# Patient Record
Sex: Male | Born: 1940 | Race: White | Hispanic: No | Marital: Married | Smoking: Former smoker
Health system: Southern US, Community
[De-identification: ages and names within clinical notes are randomized; demographics above are authoritative.]

## PROBLEM LIST (undated history)

## (undated) DIAGNOSIS — K469 Unspecified abdominal hernia without obstruction or gangrene: Secondary | ICD-10-CM

## (undated) DIAGNOSIS — M542 Cervicalgia: Secondary | ICD-10-CM

## (undated) DIAGNOSIS — M1990 Unspecified osteoarthritis, unspecified site: Secondary | ICD-10-CM

## (undated) DIAGNOSIS — F32A Depression, unspecified: Secondary | ICD-10-CM

## (undated) DIAGNOSIS — I739 Peripheral vascular disease, unspecified: Secondary | ICD-10-CM

## (undated) DIAGNOSIS — R519 Headache, unspecified: Secondary | ICD-10-CM

## (undated) DIAGNOSIS — Z889 Allergy status to unspecified drugs, medicaments and biological substances status: Secondary | ICD-10-CM

## (undated) DIAGNOSIS — I240 Acute coronary thrombosis not resulting in myocardial infarction: Secondary | ICD-10-CM

## (undated) DIAGNOSIS — F419 Anxiety disorder, unspecified: Secondary | ICD-10-CM

## (undated) DIAGNOSIS — J45909 Unspecified asthma, uncomplicated: Secondary | ICD-10-CM

## (undated) DIAGNOSIS — F329 Major depressive disorder, single episode, unspecified: Secondary | ICD-10-CM

## (undated) DIAGNOSIS — E669 Obesity, unspecified: Secondary | ICD-10-CM

## (undated) DIAGNOSIS — Z8719 Personal history of other diseases of the digestive system: Secondary | ICD-10-CM

## (undated) DIAGNOSIS — K219 Gastro-esophageal reflux disease without esophagitis: Secondary | ICD-10-CM

## (undated) DIAGNOSIS — E785 Hyperlipidemia, unspecified: Secondary | ICD-10-CM

## (undated) DIAGNOSIS — K635 Polyp of colon: Secondary | ICD-10-CM

## (undated) DIAGNOSIS — K5792 Diverticulitis of intestine, part unspecified, without perforation or abscess without bleeding: Secondary | ICD-10-CM

## (undated) DIAGNOSIS — E119 Type 2 diabetes mellitus without complications: Secondary | ICD-10-CM

## (undated) DIAGNOSIS — C801 Malignant (primary) neoplasm, unspecified: Secondary | ICD-10-CM

## (undated) DIAGNOSIS — R51 Headache: Secondary | ICD-10-CM

## (undated) DIAGNOSIS — D649 Anemia, unspecified: Secondary | ICD-10-CM

## (undated) DIAGNOSIS — I639 Cerebral infarction, unspecified: Secondary | ICD-10-CM

## (undated) DIAGNOSIS — I10 Essential (primary) hypertension: Secondary | ICD-10-CM

## (undated) DIAGNOSIS — R0600 Dyspnea, unspecified: Secondary | ICD-10-CM

## (undated) DIAGNOSIS — H25019 Cortical age-related cataract, unspecified eye: Secondary | ICD-10-CM

## (undated) DIAGNOSIS — I2510 Atherosclerotic heart disease of native coronary artery without angina pectoris: Secondary | ICD-10-CM

## (undated) DIAGNOSIS — G4730 Sleep apnea, unspecified: Secondary | ICD-10-CM

## (undated) DIAGNOSIS — T884XXA Failed or difficult intubation, initial encounter: Secondary | ICD-10-CM

## (undated) HISTORY — PX: CARPAL TUNNEL RELEASE: SHX101

## (undated) HISTORY — PX: MOHS SURGERY: SUR867

## (undated) HISTORY — PX: TRIGGER FINGER RELEASE: SHX641

## (undated) HISTORY — PX: ROTATOR CUFF REPAIR: SHX139

## (undated) HISTORY — PX: BACK SURGERY: SHX140

## (undated) HISTORY — PX: TONSILLECTOMY: SUR1361

## (undated) HISTORY — PX: POLYPECTOMY: SHX149

## (undated) HISTORY — PX: APPENDECTOMY: SHX54

## (undated) HISTORY — PX: ESOPHAGOGASTRODUODENOSCOPY: SHX1529

## (undated) HISTORY — PX: COLONOSCOPY: SHX174

## (undated) HISTORY — PX: CARDIAC CATHETERIZATION: SHX172

---
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
## 2003-08-20 ENCOUNTER — Other Ambulatory Visit: Payer: Self-pay

## 2004-04-05 ENCOUNTER — Ambulatory Visit: Payer: Self-pay | Admitting: Internal Medicine

## 2006-02-20 ENCOUNTER — Ambulatory Visit: Payer: Self-pay | Admitting: Gastroenterology

## 2009-02-06 ENCOUNTER — Ambulatory Visit: Payer: Self-pay | Admitting: Neurology

## 2009-05-22 ENCOUNTER — Ambulatory Visit: Payer: Self-pay | Admitting: Internal Medicine

## 2010-03-08 ENCOUNTER — Ambulatory Visit: Payer: Self-pay | Admitting: Internal Medicine

## 2010-03-29 ENCOUNTER — Observation Stay: Payer: Self-pay | Admitting: Internal Medicine

## 2010-12-11 ENCOUNTER — Ambulatory Visit: Payer: Self-pay | Admitting: Unknown Physician Specialty

## 2011-06-19 ENCOUNTER — Ambulatory Visit: Payer: Self-pay | Admitting: Gastroenterology

## 2011-06-19 HISTORY — PX: ESOPHAGOGASTRODUODENOSCOPY: SHX1529

## 2012-01-08 ENCOUNTER — Ambulatory Visit: Payer: Self-pay | Admitting: Internal Medicine

## 2012-01-24 ENCOUNTER — Ambulatory Visit: Payer: Self-pay | Admitting: Unknown Physician Specialty

## 2013-11-03 DIAGNOSIS — J45909 Unspecified asthma, uncomplicated: Secondary | ICD-10-CM | POA: Insufficient documentation

## 2013-11-03 DIAGNOSIS — E669 Obesity, unspecified: Secondary | ICD-10-CM | POA: Insufficient documentation

## 2013-11-03 DIAGNOSIS — I10 Essential (primary) hypertension: Secondary | ICD-10-CM | POA: Insufficient documentation

## 2013-11-03 DIAGNOSIS — E785 Hyperlipidemia, unspecified: Secondary | ICD-10-CM | POA: Insufficient documentation

## 2013-11-03 DIAGNOSIS — E119 Type 2 diabetes mellitus without complications: Secondary | ICD-10-CM | POA: Insufficient documentation

## 2014-02-07 DIAGNOSIS — C44301 Unspecified malignant neoplasm of skin of nose: Secondary | ICD-10-CM | POA: Insufficient documentation

## 2014-03-23 DIAGNOSIS — M65332 Trigger finger, left middle finger: Secondary | ICD-10-CM

## 2014-03-23 DIAGNOSIS — M7541 Impingement syndrome of right shoulder: Secondary | ICD-10-CM | POA: Insufficient documentation

## 2014-03-23 DIAGNOSIS — M65341 Trigger finger, right ring finger: Secondary | ICD-10-CM

## 2014-03-23 HISTORY — DX: Trigger finger, right ring finger: M65.341

## 2014-03-23 HISTORY — DX: Trigger finger, left middle finger: M65.332

## 2014-04-18 ENCOUNTER — Ambulatory Visit: Payer: Self-pay | Admitting: Unknown Physician Specialty

## 2014-07-01 ENCOUNTER — Ambulatory Visit: Payer: Self-pay | Admitting: Unknown Physician Specialty

## 2014-07-07 DIAGNOSIS — M75101 Unspecified rotator cuff tear or rupture of right shoulder, not specified as traumatic: Secondary | ICD-10-CM | POA: Insufficient documentation

## 2014-12-09 DIAGNOSIS — M7551 Bursitis of right shoulder: Secondary | ICD-10-CM | POA: Insufficient documentation

## 2015-07-24 DIAGNOSIS — M25562 Pain in left knee: Secondary | ICD-10-CM | POA: Insufficient documentation

## 2015-07-26 ENCOUNTER — Ambulatory Visit
Admission: RE | Admit: 2015-07-26 | Discharge: 2015-07-26 | Disposition: A | Discharge status: Home / Self Care | Payer: Medicare HMO | Source: Ambulatory Visit | Attending: Unknown Physician Specialty | Admitting: Unknown Physician Specialty

## 2015-07-26 ENCOUNTER — Other Ambulatory Visit: Payer: Self-pay | Admitting: Unknown Physician Specialty

## 2015-07-26 DIAGNOSIS — X58XXXA Exposure to other specified factors, initial encounter: Secondary | ICD-10-CM | POA: Diagnosis not present

## 2015-07-26 DIAGNOSIS — S83209A Unspecified tear of unspecified meniscus, current injury, unspecified knee, initial encounter: Secondary | ICD-10-CM | POA: Insufficient documentation

## 2015-07-26 DIAGNOSIS — S83242A Other tear of medial meniscus, current injury, left knee, initial encounter: Secondary | ICD-10-CM | POA: Insufficient documentation

## 2015-07-26 DIAGNOSIS — M25562 Pain in left knee: Secondary | ICD-10-CM

## 2015-08-28 ENCOUNTER — Encounter
Admission: RE | Admit: 2015-08-28 | Discharge: 2015-08-28 | Disposition: A | Discharge status: Home / Self Care | Payer: Medicare HMO | Source: Ambulatory Visit | Attending: Unknown Physician Specialty | Admitting: Unknown Physician Specialty

## 2015-08-28 ENCOUNTER — Encounter: Payer: Self-pay | Admitting: *Deleted

## 2015-08-28 DIAGNOSIS — M94262 Chondromalacia, left knee: Secondary | ICD-10-CM | POA: Diagnosis not present

## 2015-08-28 DIAGNOSIS — I10 Essential (primary) hypertension: Secondary | ICD-10-CM | POA: Diagnosis not present

## 2015-08-28 DIAGNOSIS — Z79899 Other long term (current) drug therapy: Secondary | ICD-10-CM | POA: Diagnosis not present

## 2015-08-28 DIAGNOSIS — E669 Obesity, unspecified: Secondary | ICD-10-CM | POA: Diagnosis not present

## 2015-08-28 DIAGNOSIS — Z800 Family history of malignant neoplasm of digestive organs: Secondary | ICD-10-CM | POA: Diagnosis not present

## 2015-08-28 DIAGNOSIS — M25562 Pain in left knee: Secondary | ICD-10-CM | POA: Diagnosis present

## 2015-08-28 DIAGNOSIS — X58XXXA Exposure to other specified factors, initial encounter: Secondary | ICD-10-CM | POA: Diagnosis not present

## 2015-08-28 DIAGNOSIS — Z9049 Acquired absence of other specified parts of digestive tract: Secondary | ICD-10-CM | POA: Diagnosis not present

## 2015-08-28 DIAGNOSIS — Z9109 Other allergy status, other than to drugs and biological substances: Secondary | ICD-10-CM | POA: Diagnosis not present

## 2015-08-28 DIAGNOSIS — Z9889 Other specified postprocedural states: Secondary | ICD-10-CM | POA: Diagnosis not present

## 2015-08-28 DIAGNOSIS — G4730 Sleep apnea, unspecified: Secondary | ICD-10-CM | POA: Diagnosis not present

## 2015-08-28 DIAGNOSIS — E119 Type 2 diabetes mellitus without complications: Secondary | ICD-10-CM | POA: Diagnosis not present

## 2015-08-28 DIAGNOSIS — K219 Gastro-esophageal reflux disease without esophagitis: Secondary | ICD-10-CM | POA: Diagnosis not present

## 2015-08-28 DIAGNOSIS — Z7984 Long term (current) use of oral hypoglycemic drugs: Secondary | ICD-10-CM | POA: Diagnosis not present

## 2015-08-28 DIAGNOSIS — S83242A Other tear of medial meniscus, current injury, left knee, initial encounter: Secondary | ICD-10-CM | POA: Diagnosis not present

## 2015-08-28 DIAGNOSIS — Z888 Allergy status to other drugs, medicaments and biological substances status: Secondary | ICD-10-CM | POA: Diagnosis not present

## 2015-08-28 DIAGNOSIS — Z8249 Family history of ischemic heart disease and other diseases of the circulatory system: Secondary | ICD-10-CM | POA: Diagnosis not present

## 2015-08-28 DIAGNOSIS — J45909 Unspecified asthma, uncomplicated: Secondary | ICD-10-CM | POA: Diagnosis not present

## 2015-08-28 DIAGNOSIS — E785 Hyperlipidemia, unspecified: Secondary | ICD-10-CM | POA: Diagnosis not present

## 2015-08-28 DIAGNOSIS — Z880 Allergy status to penicillin: Secondary | ICD-10-CM | POA: Diagnosis not present

## 2015-08-28 DIAGNOSIS — Z6829 Body mass index (BMI) 29.0-29.9, adult: Secondary | ICD-10-CM | POA: Diagnosis not present

## 2015-08-28 DIAGNOSIS — Z791 Long term (current) use of non-steroidal anti-inflammatories (NSAID): Secondary | ICD-10-CM | POA: Diagnosis not present

## 2015-08-28 DIAGNOSIS — Z7951 Long term (current) use of inhaled steroids: Secondary | ICD-10-CM | POA: Diagnosis not present

## 2015-08-28 DIAGNOSIS — Z886 Allergy status to analgesic agent status: Secondary | ICD-10-CM | POA: Diagnosis not present

## 2015-08-28 DIAGNOSIS — M1990 Unspecified osteoarthritis, unspecified site: Secondary | ICD-10-CM | POA: Diagnosis not present

## 2015-08-28 LAB — BASIC METABOLIC PANEL
Anion gap: 12 (ref 5–15)
BUN: 25 mg/dL — AB (ref 6–20)
CHLORIDE: 100 mmol/L — AB (ref 101–111)
CO2: 27 mmol/L (ref 22–32)
CREATININE: 0.86 mg/dL (ref 0.61–1.24)
Calcium: 10 mg/dL (ref 8.9–10.3)
GFR calc Af Amer: >60 mL/min (ref >60)
GFR calc non Af Amer: >60 mL/min (ref >60)
Glucose, Bld: 65 mg/dL (ref 65–99)
Potassium: 4.2 mmol/L (ref 3.5–5.1)
Sodium: 139 mmol/L (ref 135–145)

## 2015-08-28 NOTE — Patient Instructions (Addendum)
  Your procedure is scheduled on: 08-30-15 [facility]) Report to [place] To find out your arrival time please call [phone] between 1PM - 3PM on 08-29-15 (TUESDAY)  Remember: Instructions that are not followed completely may result in serious medical risk, up to and including death, or upon the discretion of your surgeon and anesthesiologist your surgery may need to be rescheduled.    _X___ 1. Do not eat food or drink liquids after midnight. No gum chewing or hard candies.     _X___ 2. No Alcohol for 24 hours before or after surgery.   ____ 3. Bring all medications with you on the day of surgery if instructed.    _X___ 4. Notify your doctor if there is any change in your medical condition     (cold, fever, infections).     Do not wear jewelry, make-up, hairpins, clips or nail polish.  Do not wear lotions, powders, or perfumes. You may wear deodorant.  Do not shave 48 hours prior to surgery. Men may shave face and neck.  Do not bring valuables to the hospital.    [facility] is not responsible for any belongings or valuables.               Contacts, dentures or bridgework may not be worn into surgery.  Leave your suitcase in the car. After surgery it may be brought to your room.  For patients admitted to the hospital, discharge time is determined by your treatment team.   Patients discharged the day of surgery will not be allowed to drive home.   Please read over the following fact sheets that you were given:      _X___ Take these medicines the morning of surgery with A SIP OF WATER:    1. AMLODIPINE  2. PRILOSEC  3. TAKE A PRILOSEC Tuesday NIGHT BEFORE BED  4. MAY TAKE LORAZEPAM IF NEEDED AM OF SURGERY  5.  6.  ____ Fleet Enema (as directed)   _X___ Use CHG Soap as directed  ____ Use inhalers on the day of surgery  _X___ Stop metformin 2 days prior to surgery-STOP METFORMIN NOW   ____ Take 1/2 of usual insulin dose the night before  surgery and none on the morning of surgery.   ____ Stop Coumadin/Plavix/aspirin-N/A  _X___ Stop Anti-inflammatories-STOP MELOXICAM NOW-NO NSAIDS OR ASPIRIN PRODUCTS-TRAMADOL/TYLENOL OK TO TAKE   ____ Stop supplements until after surgery.    ____ Bring C-Pap to the hospital.

## 2015-08-30 ENCOUNTER — Encounter: Payer: Self-pay | Admitting: *Deleted

## 2015-08-30 ENCOUNTER — Encounter
Admission: RE | Disposition: A | Discharge status: Home / Self Care | Payer: Self-pay | Source: Ambulatory Visit | Attending: Unknown Physician Specialty

## 2015-08-30 ENCOUNTER — Ambulatory Visit
Admission: RE | Admit: 2015-08-30 | Discharge: 2015-08-30 | Disposition: A | Discharge status: Home / Self Care | Payer: Medicare HMO | Source: Ambulatory Visit | Attending: Unknown Physician Specialty | Admitting: Unknown Physician Specialty

## 2015-08-30 ENCOUNTER — Ambulatory Visit: Payer: Medicare HMO | Admitting: Anesthesiology

## 2015-08-30 DIAGNOSIS — Z800 Family history of malignant neoplasm of digestive organs: Secondary | ICD-10-CM | POA: Insufficient documentation

## 2015-08-30 DIAGNOSIS — Z791 Long term (current) use of non-steroidal anti-inflammatories (NSAID): Secondary | ICD-10-CM | POA: Insufficient documentation

## 2015-08-30 DIAGNOSIS — Z886 Allergy status to analgesic agent status: Secondary | ICD-10-CM | POA: Insufficient documentation

## 2015-08-30 DIAGNOSIS — Z9889 Other specified postprocedural states: Secondary | ICD-10-CM | POA: Insufficient documentation

## 2015-08-30 DIAGNOSIS — E669 Obesity, unspecified: Secondary | ICD-10-CM | POA: Insufficient documentation

## 2015-08-30 DIAGNOSIS — Z79899 Other long term (current) drug therapy: Secondary | ICD-10-CM | POA: Insufficient documentation

## 2015-08-30 DIAGNOSIS — J45909 Unspecified asthma, uncomplicated: Secondary | ICD-10-CM | POA: Insufficient documentation

## 2015-08-30 DIAGNOSIS — S83242A Other tear of medial meniscus, current injury, left knee, initial encounter: Secondary | ICD-10-CM | POA: Diagnosis not present

## 2015-08-30 DIAGNOSIS — M1990 Unspecified osteoarthritis, unspecified site: Secondary | ICD-10-CM | POA: Insufficient documentation

## 2015-08-30 DIAGNOSIS — Z7984 Long term (current) use of oral hypoglycemic drugs: Secondary | ICD-10-CM | POA: Insufficient documentation

## 2015-08-30 DIAGNOSIS — X58XXXA Exposure to other specified factors, initial encounter: Secondary | ICD-10-CM | POA: Insufficient documentation

## 2015-08-30 DIAGNOSIS — G4730 Sleep apnea, unspecified: Secondary | ICD-10-CM | POA: Insufficient documentation

## 2015-08-30 DIAGNOSIS — E119 Type 2 diabetes mellitus without complications: Secondary | ICD-10-CM | POA: Insufficient documentation

## 2015-08-30 DIAGNOSIS — M94262 Chondromalacia, left knee: Secondary | ICD-10-CM | POA: Insufficient documentation

## 2015-08-30 DIAGNOSIS — Z8249 Family history of ischemic heart disease and other diseases of the circulatory system: Secondary | ICD-10-CM | POA: Insufficient documentation

## 2015-08-30 DIAGNOSIS — E785 Hyperlipidemia, unspecified: Secondary | ICD-10-CM | POA: Insufficient documentation

## 2015-08-30 DIAGNOSIS — Z7951 Long term (current) use of inhaled steroids: Secondary | ICD-10-CM | POA: Insufficient documentation

## 2015-08-30 DIAGNOSIS — Z9109 Other allergy status, other than to drugs and biological substances: Secondary | ICD-10-CM | POA: Insufficient documentation

## 2015-08-30 DIAGNOSIS — Z880 Allergy status to penicillin: Secondary | ICD-10-CM | POA: Insufficient documentation

## 2015-08-30 DIAGNOSIS — I10 Essential (primary) hypertension: Secondary | ICD-10-CM | POA: Insufficient documentation

## 2015-08-30 DIAGNOSIS — Z888 Allergy status to other drugs, medicaments and biological substances status: Secondary | ICD-10-CM | POA: Insufficient documentation

## 2015-08-30 DIAGNOSIS — Z9049 Acquired absence of other specified parts of digestive tract: Secondary | ICD-10-CM | POA: Insufficient documentation

## 2015-08-30 DIAGNOSIS — K219 Gastro-esophageal reflux disease without esophagitis: Secondary | ICD-10-CM | POA: Insufficient documentation

## 2015-08-30 DIAGNOSIS — Z6829 Body mass index (BMI) 29.0-29.9, adult: Secondary | ICD-10-CM | POA: Insufficient documentation

## 2015-08-30 HISTORY — DX: Hyperlipidemia, unspecified: E78.5

## 2015-08-30 HISTORY — DX: Cervicalgia: M54.2

## 2015-08-30 HISTORY — DX: Unspecified asthma, uncomplicated: J45.909

## 2015-08-30 HISTORY — DX: Gastro-esophageal reflux disease without esophagitis: K21.9

## 2015-08-30 HISTORY — DX: Anxiety disorder, unspecified: F41.9

## 2015-08-30 HISTORY — PX: KNEE ARTHROSCOPY: SHX127

## 2015-08-30 HISTORY — DX: Essential (primary) hypertension: I10

## 2015-08-30 HISTORY — DX: Type 2 diabetes mellitus without complications: E11.9

## 2015-08-30 HISTORY — DX: Malignant (primary) neoplasm, unspecified: C80.1

## 2015-08-30 HISTORY — DX: Sleep apnea, unspecified: G47.30

## 2015-08-30 HISTORY — DX: Unspecified osteoarthritis, unspecified site: M19.90

## 2015-08-30 LAB — GLUCOSE, CAPILLARY
Glucose-Capillary: 187 mg/dL — ABNORMAL HIGH (ref 65–99)
Glucose-Capillary: 174 mg/dL — ABNORMAL HIGH (ref 65–99)

## 2015-08-30 SURGERY — ARTHROSCOPY, KNEE
Anesthesia: General | Site: Knee | Laterality: Left | Wound class: Clean

## 2015-08-30 MED ORDER — PROPOFOL 10 MG/ML IV BOLUS
INTRAVENOUS | Status: DC | PRN
Start: 2015-08-30 — End: 2015-08-30
  Administered 2015-08-30: 180 mg via INTRAVENOUS

## 2015-08-30 MED ORDER — FENTANYL CITRATE (PF) 100 MCG/2ML IJ SOLN: 25.0000 ug | INTRAMUSCULAR | Status: DC | PRN

## 2015-08-30 MED ORDER — DEXAMETHASONE SODIUM PHOSPHATE 10 MG/ML IJ SOLN
INTRAMUSCULAR | Status: DC | PRN
  Administered 2015-08-30: 5 mg via INTRAVENOUS

## 2015-08-30 MED ORDER — GLYCOPYRROLATE 0.2 MG/ML IJ SOLN
INTRAMUSCULAR | Status: DC | PRN
  Administered 2015-08-30: 0.2 mg via INTRAVENOUS

## 2015-08-30 MED ORDER — LIDOCAINE HCL (CARDIAC) 20 MG/ML IV SOLN
INTRAVENOUS | Status: DC | PRN
  Administered 2015-08-30: 100 mg via INTRAVENOUS

## 2015-08-30 MED ORDER — BUPIVACAINE HCL (PF) 0.5 % IJ SOLN
INTRAMUSCULAR | Status: DC | PRN
  Administered 2015-08-30: 15 mL

## 2015-08-30 MED ORDER — BUPIVACAINE HCL (PF) 0.5 % IJ SOLN
INTRAMUSCULAR | Status: AC
  Filled 2015-08-30: qty 30

## 2015-08-30 MED ORDER — ONDANSETRON HCL 4 MG/2ML IJ SOLN
INTRAMUSCULAR | Status: DC | PRN
  Administered 2015-08-30: 4 mg via INTRAVENOUS

## 2015-08-30 MED ORDER — ONDANSETRON HCL 4 MG/2ML IJ SOLN: 4.0000 mg | Freq: Once | INTRAMUSCULAR | Status: DC | PRN

## 2015-08-30 MED ORDER — SODIUM CHLORIDE 0.9 % IV SOLN
INTRAVENOUS | Status: DC
  Administered 2015-08-30: 07:00:00 via INTRAVENOUS

## 2015-08-30 MED ORDER — NORCO 5-325 MG PO TABS: 1.0000 | ORAL_TABLET | Freq: Four times a day (QID) | ORAL | Status: DC | PRN

## 2015-08-30 MED ORDER — FENTANYL CITRATE (PF) 100 MCG/2ML IJ SOLN
INTRAMUSCULAR | Status: DC | PRN
  Administered 2015-08-30 (×2): 25 ug via INTRAVENOUS

## 2015-08-30 SURGICAL SUPPLY — 38 items
ARTHROWAND PARAGON T2 (SURGICAL WAND)
BLADE ABRADER 4.5 (BLADE) ×3 IMPLANT
BLADE FULL RADIUS 3.5 (BLADE) ×3 IMPLANT
BLADE SHAVER 4.5X7 STR FR (MISCELLANEOUS) IMPLANT
BNDG ESMARK 6X12 TAN STRL LF (GAUZE/BANDAGES/DRESSINGS) ×3 IMPLANT
BUR ABRADER 4.0 W/FLUTE AQUA (MISCELLANEOUS) IMPLANT
BURR ABRADER 4.0 W/FLUTE AQUA (MISCELLANEOUS)
CHLORAPREP W/TINT 26ML (MISCELLANEOUS) ×3 IMPLANT
DRAPE LEGGINS SURG 28X43 STRL (DRAPES) ×3 IMPLANT
GAUZE SPONGE 4X4 12PLY STRL (GAUZE/BANDAGES/DRESSINGS) ×3 IMPLANT
GLOVE BIO SURGEON STRL SZ7.5 (GLOVE) ×3 IMPLANT
GLOVE BIO SURGEON STRL SZ8 (GLOVE) ×3 IMPLANT
GLOVE INDICATOR 8.0 STRL GRN (GLOVE) ×3 IMPLANT
GOWN STRL REUS W/ TWL LRG LVL3 (GOWN DISPOSABLE) ×1 IMPLANT
GOWN STRL REUS W/TWL LRG LVL3 (GOWN DISPOSABLE) ×2
GOWN STRL REUS W/TWL LRG LVL4 (GOWN DISPOSABLE) ×3 IMPLANT
IV LACTATED RINGER IRRG 3000ML (IV SOLUTION) ×4
IV LR IRRIG 3000ML ARTHROMATIC (IV SOLUTION) ×2 IMPLANT
KIT RM TURNOVER STRD PROC AR (KITS) ×3 IMPLANT
MANIFOLD 4PT FOR NEPTUNE1 (MISCELLANEOUS) ×3 IMPLANT
PACK ARTHROSCOPY KNEE (MISCELLANEOUS) ×3 IMPLANT
PADDING CAST 4IN STRL (MISCELLANEOUS)
PADDING CAST BLEND 4X4 STRL (MISCELLANEOUS) IMPLANT
SET TUBE SUCT SHAVER OUTFL 24K (TUBING) ×3 IMPLANT
SET TUBE TIP INTRA-ARTICULAR (MISCELLANEOUS) ×3 IMPLANT
SOL PREP PVP 2OZ (MISCELLANEOUS) ×3
SOLUTION PREP PVP 2OZ (MISCELLANEOUS) ×1 IMPLANT
SUT ETH BLK MONO 3 0 FS 1 12/B (SUTURE) ×3 IMPLANT
SUT ETHILON 3-0 FS-10 30 BLK (SUTURE) ×3
SUTURE EHLN 3-0 FS-10 30 BLK (SUTURE) ×1 IMPLANT
TAPE MICROFOAM 4IN (TAPE) ×3 IMPLANT
TUBING ARTHRO INFLOW-ONLY STRL (TUBING) ×3 IMPLANT
WAND 30 DEG SABER W/CORD (SURGICAL WAND) IMPLANT
WAND ARTHRO PARAGON T2 (SURGICAL WAND) IMPLANT
WAND COVAC 50 IFS (MISCELLANEOUS) ×3 IMPLANT
WAND HAND CNTRL MULTIVAC 50 (MISCELLANEOUS) IMPLANT
WAND HAND CNTRL MULTIVAC 90 (MISCELLANEOUS) IMPLANT
WRAP KNEE W/COLD PACKS 25.5X14 (SOFTGOODS) ×3 IMPLANT

## 2015-08-30 NOTE — Transfer of Care (Signed)
Immediate Anesthesia Transfer of Care Note  Patient: Connor Hall  Procedure(s) Performed: Procedure(s): ARTHROSCOPY KNEE LEFT, partial medial menisectomy, chondral debridement (Left)  Patient Location: PACU  Anesthesia Type:General  Level of Consciousness: awake, alert , oriented and patient cooperative  Airway & Oxygen Therapy: Patient Spontanous Breathing and Patient connected to nasal cannula oxygen  Post-op Assessment: Report given to RN, Post -op Vital signs reviewed and stable and Patient moving all extremities  Post vital signs: Reviewed and stable  Last Vitals:  Filed Vitals:   08/30/15 0605 08/30/15 0901  BP: 163/80 150/89  Pulse: 71 68  Temp: 36.9 C 36.2 C  Resp: 16 14    Last Pain:  Filed Vitals:   08/30/15 0903  PainSc: 2          Complications: No apparent anesthesia complications

## 2015-08-30 NOTE — Op Note (Signed)
Patient: Connor Hall, Connor Hall.  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   History: Patient's had a long history of left knee pain.  The plain films revealed mild medial compartment narrowing .  The patient had an MRI which revealed torn medial meniscus plus medial compartment chondral pathology.The patient was scheduled for surgery due to persistent discomfort despite conservative treatment.  The patient was taken the operating room where satisfactory general anesthesia was achieved. A tourniquet and leg holder were was applied to the left thigh. A well leg support was applied to the nonoperative extremity. The left knee was prepped and draped in usual fashion for an arthroscopic procedure. An inflow cannula was introduced superomedially. The joint was distended with lactated Ringer's. Scope was introduced through an inferolateral puncture wound and a probe through an inferomedial puncture wound. Inspection of the medial compartment revealed  a posterior root tear of the medial meniscus plus grade 3 medial compartment chondral changes. I resected the medial meniscus tear using a combination of basket biters and a motorized resector. There remaining rim was contoured with an angled ArthroCare wand. I used a turbo whisker to debride the chondral changes. Inspection of the intercondylar notch revealed intact cruciates. Inspection of the the lateral compartment revealed no significant meniscal tearing or chondral pathology.   Trochlear groove was inspected and appeared to be fairly smooth.  Retropatellar surface was smooth. The patella seemed to track fairly well.  The instruments were removed from the joint at  this time. The puncture wounds were closed with 3-0 nylon in vertical mattress fashion. I injected each puncture wound with several cc of half percent Marcaine without epinephrine. Betadine was applied the wounds followed by sterile dressing. An ice pack was applied to the right knee. The patient was awakened and transferred to the stretcher bed. The patient was taken to the recovery room in satisfactory condition.  The tourniquet was not inflated during the course of the procedure. Blood loss was negligible.

## 2015-08-30 NOTE — Discharge Instructions (Signed)
[facility]  Kathrene Alu., M.D. [phone]   KNEE ARTHROSCOPY POST OPERATION INSTRUCTIONS:  PLEASE READ THESE INSTRUCTIONS ABOUT POST OPERATION CARE. THEY WILL ANSWER MOST OF YOUR QUESTIONS.  You have been given a prescription for pain. Please take as directed for pain.  You can walk, keeping the knee slightly stiff-avoid doing too much bending the first day. (if ACL reconstruction is performed, keep brace locked in extension when walking.)  You will use crutches or cane if needed. Can weight bear as tolerated  Plan to take three to four days off from work. You can resume work when you are comfortable. (This can be a week or more, depending on the type of work you do.)  To reduce pain and swelling, place one to two pillows under the knee the first two or three days when sitting or lying. An ice pack may be placed on top of the area over the dressing. Instructions for making homemade icepack are as follow:  Flexible homemade alcohol water ice pack  2 cups water  1 cup rubbing alcohol  food coloring for the blue tint (optional)  2 zip-top bags - gallon-size  Mix the water and alcohol together in one of your zip-top bags and add food coloring. Release as much air as possible and seal the bag. Place in freezer for at least 12 hours.  The small incisions in your knee are closed with nylon stitches. They will be removed in the office.  The bulky dressing may be removed in the third day after surgery. (If ACL surgery-DO NOT REMOVE BANDAGES). Put a waterproof band-aid over each stitch. Do not put any creams or ointments on wounds. You may shower at this time, but change waterproof band-aids after showering. KEEP INCISIONS CLEAN AND DRY UNTIL YOU RETURN TO THE OFFICE.  Sometimes the operative area remains somewhat painful and swollen for several weeks. This is usually nothing to worry about, but call if you have any excessive symptoms, especially  fever. It is not unusual to have a low grade fever of 99 degrees for the first few days. If persist after 3-4 days call the office. It is not uncommon for the pain to be a little worse on the third day after surgery.  Begin doing gentle exercises right away. They will be limited by the amount of pain and swelling you have.  Exercising will reduce the swelling, increase motion, and prevent muscle weakness. Exercises: Straight leg raising and gentle knee bending.  Take 81 milligram aspirin twice a day for 2 weeks after meals or milk. This along with elevation will help reduce the possibility of phlebitis in your operated leg.  Avoid strenuous athletics for a minimum of 4 to 6 weeks after arthroscopic surgery (approximately five months if ACL surgery).  If the surgery included ACL reconstruction the brace that is supplied to the extremity post surgery is to be locked in extension when you are asleep and is to be locked in extension when you are ambulating. It can be unlocked for exercises or sitting.  Keep your post surgery appointment that has been made for you. If you do not remember the date call [phone]. Your follow up appointment should be between 7-10 days.  AMBULATORY SURGERY  DISCHARGE INSTRUCTIONS   The drugs that you were given will stay in your system until tomorrow so for the next 24 hours you should not:  Drive an automobile Make any legal decisions Drink any alcoholic beverage  You may resume regular meals tomorrow.  Today it is better to start with liquids and gradually work up to solid foods.  You may eat anything you prefer, but it is better to start with liquids, then soup and crackers, and gradually work up to solid foods.   Please notify your doctor immediately if you have any unusual bleeding, trouble breathing, redness and pain at the surgery site, drainage, fever, or pain not relieved by medication.    Additional Instructions:        Please contact your  physician with any problems or Same Day Surgery at [phone], Monday through Friday 6 am to 4 pm, or [place] at [facility] number at [phone].

## 2015-08-30 NOTE — H&P (Signed)
  H and P reviewed. No changes. Uploaded at later date. 

## 2015-08-30 NOTE — Anesthesia Preprocedure Evaluation (Signed)
Anesthesia Evaluation  Patient identified by MRN, date of birth, ID band Patient awake    Reviewed: Allergy & Precautions, H&P , NPO status , Patient's Chart, lab work & pertinent test results, reviewed documented beta blocker date and time   History of Anesthesia Complications Negative for: history of anesthetic complications  Airway Mallampati: I  TM Distance: >3 FB Neck ROM: full    Dental no notable dental hx. (+) Teeth Intact, Caps   Pulmonary neg shortness of breath, asthma , neg sleep apnea, neg COPD, neg recent URI, former smoker,    Pulmonary exam normal breath sounds clear to auscultation       Cardiovascular Exercise Tolerance: Good hypertension, (-) angina(-) CAD, (-) Past MI, (-) Cardiac Stents and (-) CABG Normal cardiovascular exam(-) dysrhythmias (-) Valvular Problems/Murmurs Rhythm:regular Rate:Normal     Neuro/Psych negative neurological ROS  negative psych ROS   GI/Hepatic Neg liver ROS, GERD  Controlled and Medicated,  Endo/Other  diabetes, Oral Hypoglycemic Agents  Renal/GU negative Renal ROS  negative genitourinary   Musculoskeletal   Abdominal   Peds  Hematology negative hematology ROS (+)   Anesthesia Other Findings Past Medical History:   Arthritis                                                    Cervicalgia                                                  Diabetes mellitus without complication (HCC)                 Hyperlipidemia                                               Hypertension                                                 Sleep apnea                                                  Asthma                                                         Comment:WELL CONTROLLED   Anxiety                                                      GERD (gastroesophageal reflux disease)  Cancer (HCC)                                                   Comment:MELANOMA  (NOSE, FACE AND HANDS), BASAL CELL   Reproductive/Obstetrics negative OB ROS                             Anesthesia Physical Anesthesia Plan  ASA: III  Anesthesia Plan: General   Post-op Pain Management:    Induction:   Airway Management Planned:   Additional Equipment:   Intra-op Plan:   Post-operative Plan:   Informed Consent: I have reviewed the patients History and Physical, chart, labs and discussed the procedure including the risks, benefits and alternatives for the proposed anesthesia with the patient or authorized representative who has indicated his/her understanding and acceptance.   Dental Advisory Given  Plan Discussed with: Anesthesiologist, CRNA and Surgeon  Anesthesia Plan Comments:         Anesthesia Quick Evaluation

## 2015-08-30 NOTE — Anesthesia Postprocedure Evaluation (Signed)
Anesthesia Post Note  Patient: SAJAN MALOOF  Procedure(s) Performed: Procedure(s) (LRB): ARTHROSCOPY KNEE LEFT, partial medial menisectomy, chondral debridement (Left)  Patient location during evaluation: PACU Anesthesia Type: General Level of consciousness: awake and alert Pain management: pain level controlled Vital Signs Assessment: post-procedure vital signs reviewed and stable Respiratory status: spontaneous breathing, nonlabored ventilation, respiratory function stable and patient connected to nasal cannula oxygen Cardiovascular status: blood pressure returned to baseline and stable Postop Assessment: no signs of nausea or vomiting Anesthetic complications: no    Last Vitals:  Filed Vitals:   08/30/15 0954 08/30/15 1000  BP: 156/68 148/83  Pulse: 73 59  Temp: 35.9 C   Resp: 16     Last Pain:  Filed Vitals:   08/30/15 1028  PainSc: 2                  Martha Clan

## 2015-08-30 NOTE — Anesthesia Procedure Notes (Signed)
Procedure Name: LMA Insertion Date/Time: 08/30/2015 7:39 AM Performed by: Alda Berthold Pre-anesthesia Checklist: Patient identified, Emergency Drugs available, Suction available and Patient being monitored Patient Re-evaluated:Patient Re-evaluated prior to inductionOxygen Delivery Method: Circle system utilized Preoxygenation: Pre-oxygenation with 100% oxygen Intubation Type: IV induction Ventilation: Mask ventilation without difficulty LMA: LMA inserted LMA Size: 5.0 Tube type: Oral Number of attempts: 1 Placement Confirmation: positive ETCO2 and breath sounds checked- equal and bilateral Tube secured with: Tape Dental Injury: Teeth and Oropharynx as per pre-operative assessment

## 2015-10-09 DIAGNOSIS — M1712 Unilateral primary osteoarthritis, left knee: Secondary | ICD-10-CM | POA: Insufficient documentation

## 2016-03-25 ENCOUNTER — Encounter: Payer: Self-pay | Admitting: *Deleted

## 2016-04-02 NOTE — H&P (Signed)
See scanned note.

## 2016-04-03 ENCOUNTER — Ambulatory Visit
Admission: RE | Admit: 2016-04-03 | Discharge: 2016-04-03 | Disposition: A | Discharge status: Home / Self Care | Payer: Medicare HMO | Source: Ambulatory Visit | Attending: Ophthalmology | Admitting: Ophthalmology

## 2016-04-03 ENCOUNTER — Encounter: Payer: Self-pay | Admitting: *Deleted

## 2016-04-03 ENCOUNTER — Ambulatory Visit: Payer: Medicare HMO | Admitting: Anesthesiology

## 2016-04-03 ENCOUNTER — Encounter
Admission: RE | Disposition: A | Discharge status: Home / Self Care | Payer: Self-pay | Source: Ambulatory Visit | Attending: Ophthalmology

## 2016-04-03 DIAGNOSIS — I10 Essential (primary) hypertension: Secondary | ICD-10-CM | POA: Diagnosis not present

## 2016-04-03 DIAGNOSIS — K449 Diaphragmatic hernia without obstruction or gangrene: Secondary | ICD-10-CM | POA: Insufficient documentation

## 2016-04-03 DIAGNOSIS — E1136 Type 2 diabetes mellitus with diabetic cataract: Secondary | ICD-10-CM | POA: Insufficient documentation

## 2016-04-03 DIAGNOSIS — G4730 Sleep apnea, unspecified: Secondary | ICD-10-CM | POA: Diagnosis not present

## 2016-04-03 DIAGNOSIS — Z7984 Long term (current) use of oral hypoglycemic drugs: Secondary | ICD-10-CM | POA: Insufficient documentation

## 2016-04-03 DIAGNOSIS — E785 Hyperlipidemia, unspecified: Secondary | ICD-10-CM | POA: Diagnosis not present

## 2016-04-03 DIAGNOSIS — Z79899 Other long term (current) drug therapy: Secondary | ICD-10-CM | POA: Diagnosis not present

## 2016-04-03 DIAGNOSIS — Z87891 Personal history of nicotine dependence: Secondary | ICD-10-CM | POA: Insufficient documentation

## 2016-04-03 DIAGNOSIS — F419 Anxiety disorder, unspecified: Secondary | ICD-10-CM | POA: Insufficient documentation

## 2016-04-03 DIAGNOSIS — K219 Gastro-esophageal reflux disease without esophagitis: Secondary | ICD-10-CM | POA: Insufficient documentation

## 2016-04-03 DIAGNOSIS — M1990 Unspecified osteoarthritis, unspecified site: Secondary | ICD-10-CM | POA: Insufficient documentation

## 2016-04-03 DIAGNOSIS — F329 Major depressive disorder, single episode, unspecified: Secondary | ICD-10-CM | POA: Diagnosis not present

## 2016-04-03 HISTORY — PX: CATARACT EXTRACTION W/PHACO: SHX586

## 2016-04-03 HISTORY — DX: Personal history of other diseases of the digestive system: Z87.19

## 2016-04-03 HISTORY — DX: Depression, unspecified: F32.A

## 2016-04-03 HISTORY — DX: Major depressive disorder, single episode, unspecified: F32.9

## 2016-04-03 LAB — GLUCOSE, CAPILLARY: Glucose-Capillary: 158 mg/dL — ABNORMAL HIGH (ref 65–99)

## 2016-04-03 SURGERY — PHACOEMULSIFICATION, CATARACT, WITH IOL INSERTION
Anesthesia: Monitor Anesthesia Care | Site: Eye | Laterality: Right | Wound class: Clean

## 2016-04-03 MED ORDER — CYCLOPENTOLATE HCL 2 % OP SOLN
1.0000 [drp] | OPHTHALMIC | Status: DC
  Administered 2016-04-03 (×4): 1 [drp] via OPHTHALMIC

## 2016-04-03 MED ORDER — MIDAZOLAM HCL 2 MG/2ML IJ SOLN
INTRAMUSCULAR | Status: DC | PRN
  Administered 2016-04-03: 1 mg via INTRAVENOUS

## 2016-04-03 MED ORDER — EPINEPHRINE PF 1 MG/ML IJ SOLN
INTRAMUSCULAR | Status: AC
  Filled 2016-04-03: qty 2

## 2016-04-03 MED ORDER — LIDOCAINE HCL (PF) 4 % IJ SOLN
INTRAMUSCULAR | Status: DC | PRN
  Administered 2016-04-03: 4 mL via OPHTHALMIC

## 2016-04-03 MED ORDER — TETRACAINE HCL 0.5 % OP SOLN
OPHTHALMIC | Status: DC | PRN
  Administered 2016-04-03: 1 [drp] via OPHTHALMIC

## 2016-04-03 MED ORDER — CYCLOPENTOLATE HCL 2 % OP SOLN
OPHTHALMIC | Status: AC
  Filled 2016-04-03: qty 2

## 2016-04-03 MED ORDER — ALFENTANIL 500 MCG/ML IJ INJ
INJECTION | INTRAMUSCULAR | Status: DC | PRN
  Administered 2016-04-03: 750 ug via INTRAVENOUS

## 2016-04-03 MED ORDER — LIDOCAINE HCL (PF) 4 % IJ SOLN
INTRAMUSCULAR | Status: AC
  Filled 2016-04-03: qty 5

## 2016-04-03 MED ORDER — EPINEPHRINE PF 1 MG/ML IJ SOLN
INTRAOCULAR | Status: DC | PRN
  Administered 2016-04-03: 09:00:00 via OPHTHALMIC

## 2016-04-03 MED ORDER — MOXIFLOXACIN HCL 0.5 % OP SOLN
1.0000 [drp] | OPHTHALMIC | Status: AC
  Administered 2016-04-03 (×3): 1 [drp] via OPHTHALMIC

## 2016-04-03 MED ORDER — PHENYLEPHRINE HCL 10 % OP SOLN
OPHTHALMIC | Status: AC
  Filled 2016-04-03: qty 5

## 2016-04-03 MED ORDER — PHENYLEPHRINE HCL 10 % OP SOLN
1.0000 [drp] | OPHTHALMIC | Status: AC
  Administered 2016-04-03 (×4): 1 [drp] via OPHTHALMIC

## 2016-04-03 MED ORDER — NA CHONDROIT SULF-NA HYALURON 40-17 MG/ML IO SOLN
INTRAOCULAR | Status: AC
  Filled 2016-04-03: qty 1

## 2016-04-03 MED ORDER — POVIDONE-IODINE 5 % OP SOLN
OPHTHALMIC | Status: DC | PRN
  Administered 2016-04-03: 1 via OPHTHALMIC

## 2016-04-03 MED ORDER — MOXIFLOXACIN HCL 0.5 % OP SOLN
OPHTHALMIC | Status: AC
  Filled 2016-04-03: qty 3

## 2016-04-03 MED ORDER — SODIUM CHLORIDE 0.9 % IV SOLN
INTRAVENOUS | Status: DC
  Administered 2016-04-03: 09:00:00 via INTRAVENOUS

## 2016-04-03 MED ORDER — CEFUROXIME OPHTHALMIC INJECTION 1 MG/0.1 ML
INJECTION | OPHTHALMIC | Status: AC
  Filled 2016-04-03: qty 0.1

## 2016-04-03 MED ORDER — TETRACAINE HCL 0.5 % OP SOLN
OPHTHALMIC | Status: AC
  Filled 2016-04-03: qty 2

## 2016-04-03 MED ORDER — POVIDONE-IODINE 5 % OP SOLN
OPHTHALMIC | Status: AC
  Filled 2016-04-03: qty 30

## 2016-04-03 MED ORDER — CARBACHOL 0.01 % IO SOLN
INTRAOCULAR | Status: DC | PRN
  Administered 2016-04-03: 0.5 mL via INTRAOCULAR

## 2016-04-03 MED ORDER — HYALURONIDASE HUMAN 150 UNIT/ML IJ SOLN
INTRAMUSCULAR | Status: AC
  Filled 2016-04-03: qty 1

## 2016-04-03 MED ORDER — LIDOCAINE HCL (PF) 4 % IJ SOLN
INTRAMUSCULAR | Status: DC | PRN
  Administered 2016-04-03: 9 mL via OPHTHALMIC

## 2016-04-03 MED ORDER — NA CHONDROIT SULF-NA HYALURON 40-17 MG/ML IO SOLN
INTRAOCULAR | Status: DC | PRN
  Administered 2016-04-03: 1 mL via INTRAOCULAR

## 2016-04-03 MED ORDER — BUPIVACAINE HCL (PF) 0.75 % IJ SOLN
INTRAMUSCULAR | Status: AC
Start: 2016-04-03 — End: 2016-04-03
  Filled 2016-04-03: qty 10

## 2016-04-03 SURGICAL SUPPLY — 30 items
CANNULA ANT/CHMB 27GA (MISCELLANEOUS) ×3 IMPLANT
CORD BIP STRL DISP 12FT (MISCELLANEOUS) ×3 IMPLANT
CUP MEDICINE 2OZ PLAST GRAD ST (MISCELLANEOUS) ×3 IMPLANT
DRAPE XRAY CASSETTE 23X24 (DRAPES) ×3 IMPLANT
ERASER HMR WETFIELD 18G (MISCELLANEOUS) ×3 IMPLANT
GLOVE BIO SURGEON STRL SZ8 (GLOVE) ×3 IMPLANT
GLOVE SURG LX 6.5 MICRO (GLOVE) ×2
GLOVE SURG LX 8.0 MICRO (GLOVE) ×2
GLOVE SURG LX STRL 6.5 MICRO (GLOVE) ×1 IMPLANT
GLOVE SURG LX STRL 8.0 MICRO (GLOVE) ×1 IMPLANT
GOWN STRL REUS W/ TWL LRG LVL3 (GOWN DISPOSABLE) ×1 IMPLANT
GOWN STRL REUS W/ TWL XL LVL3 (GOWN DISPOSABLE) ×1 IMPLANT
GOWN STRL REUS W/TWL LRG LVL3 (GOWN DISPOSABLE) ×2
GOWN STRL REUS W/TWL XL LVL3 (GOWN DISPOSABLE) ×2
LENS IOL ACRSF IQ ULTRA 18.5 (Intraocular Lens) ×1 IMPLANT
LENS IOL ACRYSOF IQ 18.5 (Intraocular Lens) ×3 IMPLANT
PACK CATARACT (MISCELLANEOUS) ×3 IMPLANT
PACK CATARACT DINGLEDEIN LX (MISCELLANEOUS) ×3 IMPLANT
PACK EYE AFTER SURG (MISCELLANEOUS) ×3 IMPLANT
SHLD EYE VISITEC  UNIV (MISCELLANEOUS) ×3 IMPLANT
SOL BSS BAG (MISCELLANEOUS) ×3
SOL PREP PVP 2OZ (MISCELLANEOUS) ×3
SOLUTION BSS BAG (MISCELLANEOUS) ×1 IMPLANT
SOLUTION PREP PVP 2OZ (MISCELLANEOUS) ×1 IMPLANT
SUT SILK 5-0 (SUTURE) ×3 IMPLANT
SYR 3ML LL SCALE MARK (SYRINGE) ×3 IMPLANT
SYR 5ML LL (SYRINGE) ×3 IMPLANT
SYR TB 1ML 27GX1/2 LL (SYRINGE) ×3 IMPLANT
WATER STERILE IRR 250ML POUR (IV SOLUTION) ×3 IMPLANT
WIPE NON LINTING 3.25X3.25 (MISCELLANEOUS) ×3 IMPLANT

## 2016-04-03 NOTE — Transfer of Care (Signed)
Immediate Anesthesia Transfer of Care Note  Patient: Connor Hall  Procedure(s) Performed: Procedure(s) with comments: CATARACT EXTRACTION PHACO AND INTRAOCULAR LENS PLACEMENT (IOC) (Right) - [place] 1.37 AP% 25.8 CDE 44.67 Fluid pack lot # [id number]  Patient Location: PACU  Anesthesia Type:MAC  Level of Consciousness: awake, alert  and oriented  Airway & Oxygen Therapy: Patient Spontanous Breathing  Post-op Assessment: Report given to RN and Post -op Vital signs reviewed and stable  Post vital signs: Reviewed and stable  Last Vitals:  Vitals:   04/03/16 0757  BP: (!) 147/77  Pulse: (!) 59  Resp: 16  Temp: 36.8 C    Last Pain:  Vitals:   04/03/16 0757  TempSrc: Oral         Complications: No apparent anesthesia complications

## 2016-04-03 NOTE — Anesthesia Postprocedure Evaluation (Signed)
Anesthesia Post Note  Patient: Connor Hall  Procedure(s) Performed: Procedure(s) (LRB): CATARACT EXTRACTION PHACO AND INTRAOCULAR LENS PLACEMENT (IOC) (Right)  Patient location during evaluation: PACU Anesthesia Type: MAC Level of consciousness: awake, awake and alert and oriented Pain management: pain level controlled Vital Signs Assessment: post-procedure vital signs reviewed and stable Respiratory status: spontaneous breathing Cardiovascular status: blood pressure returned to baseline and stable Postop Assessment: no headache and no signs of nausea or vomiting Anesthetic complications: no    Last Vitals:  Vitals:   04/03/16 0757  BP: (!) 147/77  Pulse: (!) 59  Resp: 16  Temp: 36.8 C    Last Pain:  Vitals:   04/03/16 0757  TempSrc: Oral                 Assunta Pupo,  [facility]

## 2016-04-03 NOTE — Anesthesia Preprocedure Evaluation (Signed)
Anesthesia Evaluation  Patient identified by MRN, date of birth, ID band Patient awake    Reviewed: Allergy & Precautions, NPO status , Patient's Chart, lab work & pertinent test results  History of Anesthesia Complications Negative for: history of anesthetic complications  Airway Mallampati: II  TM Distance: >3 FB Neck ROM: Full    Dental no notable dental hx.    Pulmonary asthma (primarily around smoke, has not had problems in years) , sleep apnea , former smoker,    breath sounds clear to auscultation- rhonchi (-) wheezing      Cardiovascular Exercise Tolerance: Good hypertension, Pt. on medications (-) CAD and (-) Past MI  Rhythm:Regular Rate:Normal - Systolic murmurs and - Diastolic murmurs    Neuro/Psych Anxiety Depression negative neurological ROS     GI/Hepatic Neg liver ROS, hiatal hernia, GERD  ,  Endo/Other  diabetes, Type 2, Oral Hypoglycemic Agents  Renal/GU negative Renal ROS     Musculoskeletal  (+) Arthritis ,   Abdominal (+) - obese,   Peds  Hematology negative hematology ROS (+)   Anesthesia Other Findings Past Medical History: No date: Anxiety No date: Arthritis No date: Asthma     Comment: WELL CONTROLLED No date: Cancer ([place])     Comment: MELANOMA (NOSE, FACE AND HANDS), BASAL CELL No date: Cervicalgia No date: Depression No date: Diabetes mellitus without complication (HCC) No date: GERD (gastroesophageal reflux disease) No date: History of hiatal hernia No date: Hyperlipidemia No date: Hypertension No date: Sleep apnea     Comment: DOES NOT USE CPAP   Reproductive/Obstetrics                             Anesthesia Physical Anesthesia Plan  ASA: II  Anesthesia Plan: MAC   Post-op Pain Management:    Induction: Intravenous  Airway Management Planned: Natural Airway  Additional Equipment:   Intra-op Plan:   Post-operative Plan:   Informed  Consent: I have reviewed the patients History and Physical, chart, labs and discussed the procedure including the risks, benefits and alternatives for the proposed anesthesia with the patient or authorized representative who has indicated his/her understanding and acceptance.   Dental advisory given  Plan Discussed with: CRNA and Anesthesiologist  Anesthesia Plan Comments:         Anesthesia Quick Evaluation

## 2016-04-03 NOTE — Interval H&P Note (Signed)
History and Physical Interval Note:  04/03/2016 9:02 AM  [name]  has presented today for surgery, with the diagnosis of Nuclear Sclerotic Cataract Right Eye  The various methods of treatment have been discussed with the patient and family. After consideration of risks, benefits and other options for treatment, the patient has consented to  Procedure(s) with comments: CATARACT EXTRACTION PHACO AND INTRAOCULAR LENS PLACEMENT (IOC) (Right) - [place] AP% CDE Fluid pack lot #[id number] as a surgical intervention .  The patient's history has been reviewed, patient examined, no change in status, stable for surgery.  I have reviewed the patient's chart and labs.  Questions were answered to the patient's satisfaction.     Connor Hall

## 2016-04-03 NOTE — Discharge Instructions (Signed)
Eye Surgery Discharge Instructions  Expect mild scratchy sensation or mild soreness. DO NOT RUB YOUR EYE!  The day of surgery:  Minimal physical activity, but bed rest is not required  No reading, computer work, or close hand work  No bending, lifting, or straining.  May watch TV  For 24 hours:  No driving, legal decisions, or alcoholic beverages  Safety precautions  Eat anything you prefer: It is better to start with liquids, then soup then solid foods.  _____ Eye patch should be worn until postoperative exam tomorrow.  ____ Solar shield eyeglasses should be worn for comfort in the sunlight/patch while sleeping  Resume all regular medications including aspirin or Coumadin if these were discontinued prior to surgery. You may shower, bathe, shave, or wash your hair. Tylenol may be taken for mild discomfort.  Call your doctor if you experience significant pain, nausea, or vomiting, fever > 101 or other signs of infection. [phone] or [phone] Specific instructions:  Follow-up Information    Jahi Roza, MD Follow up.   Specialty:  Ophthalmology Why:  November 30 at 11:00am Contact information: [address]   [place] [address] [phone]

## 2016-04-03 NOTE — Op Note (Signed)
Date of Surgery: 04/03/2016 Date of Dictation: 04/03/2016 9:42 AM Pre-operative Diagnosis:  Nuclear Sclerotic Cataract right Eye Post-operative Diagnosis: same Procedure performed: Extra-capsular Cataract Extraction (ECCE) with placement of a posterior chamber intraocular lens (IOL) right Eye IOL:  Implant Name Type Inv. Item Serial No. Manufacturer Lot No. LRB No. Used  LENS IOL ACRYSOF IQ 18.5 - [id number] Intraocular Lens LENS IOL ACRYSOF IQ 18.5 [id number] ALCON   Right 1   Anesthesia: 2% Lidocaine and 4% Marcaine in a 50/50 mixture with 10 unites/ml of Hylenex given as a peribulbar Anesthesiologist: Anesthesiologist: Emmie Niemann, MD CRNA: Rolla Plate, CRNA; Nelda Marseille, CRNA Complications: none Estimated Blood Loss: less than 1 ml  Description of procedure:  The patient was given anesthesia and sedation via intravenous access. The patient was then prepped and draped in the usual fashion. A 25-gauge needle was bent for initiating the capsulorhexis. A 5-0 silk suture was placed through the conjunctiva superior and inferiorly to serve as bridle sutures. Hemostasis was obtained at the superior limbus using an eraser cautery. A partial thickness groove was made at the anterior surgical limbus with a 64 Beaver blade and this was dissected anteriorly with an [employer]aya. The anterior chamber was entered at 10 o'clock with a 1.0 mm paracentesis knife and through the lamellar dissection with a 2.6 mm Alcon keratome. Epi-Shugarcaine 0.5 CC [9 cc BSS Plus (Alcon), 3 cc 4% preservative-free lidocaine (Hospira) and 4 cc 1:1000 preservative-free, bisulfite-free epinephrine] was injected into the anterior chamber via the paracentesis tract. Epi-Shugarcaine 0.5 CC [9 cc BSS Plus (Alcon), 3 cc 4% preservative-free lidocaine (Hospira) and 4 cc 1:1000 preservative-free, bisulfite-free epinephrine] was injected into the anterior chamber via the paracentesis tract. DiscoVisc was injected to  replace the aqueous and a continuous tear curvilinear capsulorhexis was performed using a bent 25-gauge needle.  Balance salt on a syringe was used to perform hydro-dissection and phacoemulsification was carried out using a divide and conquer technique. Procedure(s) with comments: CATARACT EXTRACTION PHACO AND INTRAOCULAR LENS PLACEMENT (IOC) (Right) - [place] 1.37 AP% 25.8 CDE 44.67 Fluid pack lot # [id number]. Irrigation/aspiration was used to remove the residual cortex and the capsular bag was inflated with DiscoVisc. The intraocular lens was inserted into the capsular bag using a pre-loaded UltraSert Delivery System. Irrigation/aspiration was used to remove the residual DiscoVisc. The wound was inflated with balanced salt and checked for leaks. None were found. Miostat was injected via the paracentesis track and 0.1 ml of Vigamox  was injected via the paracentesis track. The wound was checked for leaks again and none were found.   The bridal sutures were removed and two drops of Vigamox were placed on the eye. An eye shield was placed to protect the eye and the patient was discharged to the recovery area in good condition.   Aleida Crandell MD

## 2016-05-21 ENCOUNTER — Encounter: Payer: Self-pay | Admitting: *Deleted

## 2016-05-29 ENCOUNTER — Ambulatory Visit
Admission: RE | Admit: 2016-05-29 | Discharge: 2016-05-29 | Disposition: A | Discharge status: Home / Self Care | Payer: Medicare HMO | Source: Ambulatory Visit | Attending: Ophthalmology | Admitting: Ophthalmology

## 2016-05-29 ENCOUNTER — Ambulatory Visit: Payer: Medicare HMO | Admitting: Anesthesiology

## 2016-05-29 ENCOUNTER — Encounter: Payer: Self-pay | Admitting: *Deleted

## 2016-05-29 ENCOUNTER — Encounter
Admission: RE | Disposition: A | Discharge status: Home / Self Care | Payer: Self-pay | Source: Ambulatory Visit | Attending: Ophthalmology

## 2016-05-29 DIAGNOSIS — Z85828 Personal history of other malignant neoplasm of skin: Secondary | ICD-10-CM | POA: Diagnosis not present

## 2016-05-29 DIAGNOSIS — K449 Diaphragmatic hernia without obstruction or gangrene: Secondary | ICD-10-CM | POA: Diagnosis not present

## 2016-05-29 DIAGNOSIS — G4730 Sleep apnea, unspecified: Secondary | ICD-10-CM | POA: Insufficient documentation

## 2016-05-29 DIAGNOSIS — Z87891 Personal history of nicotine dependence: Secondary | ICD-10-CM | POA: Diagnosis not present

## 2016-05-29 DIAGNOSIS — F329 Major depressive disorder, single episode, unspecified: Secondary | ICD-10-CM | POA: Diagnosis not present

## 2016-05-29 DIAGNOSIS — E1136 Type 2 diabetes mellitus with diabetic cataract: Secondary | ICD-10-CM | POA: Diagnosis not present

## 2016-05-29 DIAGNOSIS — F419 Anxiety disorder, unspecified: Secondary | ICD-10-CM | POA: Diagnosis not present

## 2016-05-29 DIAGNOSIS — Z79899 Other long term (current) drug therapy: Secondary | ICD-10-CM | POA: Insufficient documentation

## 2016-05-29 DIAGNOSIS — I10 Essential (primary) hypertension: Secondary | ICD-10-CM | POA: Insufficient documentation

## 2016-05-29 DIAGNOSIS — E785 Hyperlipidemia, unspecified: Secondary | ICD-10-CM | POA: Diagnosis not present

## 2016-05-29 DIAGNOSIS — M1990 Unspecified osteoarthritis, unspecified site: Secondary | ICD-10-CM | POA: Diagnosis not present

## 2016-05-29 DIAGNOSIS — Z7984 Long term (current) use of oral hypoglycemic drugs: Secondary | ICD-10-CM | POA: Diagnosis not present

## 2016-05-29 DIAGNOSIS — K219 Gastro-esophageal reflux disease without esophagitis: Secondary | ICD-10-CM | POA: Diagnosis not present

## 2016-05-29 HISTORY — PX: CATARACT EXTRACTION W/PHACO: SHX586

## 2016-05-29 LAB — GLUCOSE, CAPILLARY: Glucose-Capillary: 180 mg/dL — ABNORMAL HIGH (ref 65–99)

## 2016-05-29 SURGERY — PHACOEMULSIFICATION, CATARACT, WITH IOL INSERTION
Anesthesia: Monitor Anesthesia Care | Site: Eye | Laterality: Left | Wound class: Clean

## 2016-05-29 MED ORDER — MOXIFLOXACIN HCL 0.5 % OP SOLN
1.0000 [drp] | OPHTHALMIC | Status: AC
  Administered 2016-05-29 (×3): 1 [drp] via OPHTHALMIC

## 2016-05-29 MED ORDER — MOXIFLOXACIN HCL 0.5 % OP SOLN
OPHTHALMIC | Status: AC
  Filled 2016-05-29: qty 3

## 2016-05-29 MED ORDER — EPINEPHRINE PF 1 MG/ML IJ SOLN
INTRAMUSCULAR | Status: DC | PRN
  Administered 2016-05-29: 1 mL via OPHTHALMIC

## 2016-05-29 MED ORDER — EPINEPHRINE PF 1 MG/ML IJ SOLN
INTRAMUSCULAR | Status: AC
  Filled 2016-05-29: qty 1

## 2016-05-29 MED ORDER — MOXIFLOXACIN HCL 0.5 % OP SOLN
OPHTHALMIC | Status: DC | PRN
  Administered 2016-05-29: 0.2 mL via OPHTHALMIC

## 2016-05-29 MED ORDER — NA CHONDROIT SULF-NA HYALURON 40-17 MG/ML IO SOLN
INTRAOCULAR | Status: AC
  Filled 2016-05-29: qty 1

## 2016-05-29 MED ORDER — CARBACHOL 0.01 % IO SOLN
INTRAOCULAR | Status: DC | PRN
  Administered 2016-05-29: .5 mL via INTRAOCULAR

## 2016-05-29 MED ORDER — TETRACAINE HCL 0.5 % OP SOLN
OPHTHALMIC | Status: DC | PRN
  Administered 2016-05-29: 1 [drp] via OPHTHALMIC

## 2016-05-29 MED ORDER — BUPIVACAINE HCL (PF) 0.75 % IJ SOLN
INTRAMUSCULAR | Status: AC
Start: 2016-05-29 — End: 2016-05-29
  Filled 2016-05-29: qty 10

## 2016-05-29 MED ORDER — CEFUROXIME OPHTHALMIC INJECTION 1 MG/0.1 ML
INJECTION | OPHTHALMIC | Status: AC
  Filled 2016-05-29: qty 0.1

## 2016-05-29 MED ORDER — CYCLOPENTOLATE HCL 2 % OP SOLN
OPHTHALMIC | Status: AC
  Administered 2016-05-29: 1 [drp] via OPHTHALMIC
  Filled 2016-05-29: qty 2

## 2016-05-29 MED ORDER — POVIDONE-IODINE 5 % OP SOLN
OPHTHALMIC | Status: AC
Start: 2016-05-29 — End: 2016-05-29
  Filled 2016-05-29: qty 30

## 2016-05-29 MED ORDER — ALFENTANIL 500 MCG/ML IJ INJ
INJECTION | INTRAMUSCULAR | Status: DC | PRN
  Administered 2016-05-29: 500 ug via INTRAVENOUS

## 2016-05-29 MED ORDER — MIDAZOLAM HCL 2 MG/2ML IJ SOLN
INTRAMUSCULAR | Status: AC
  Filled 2016-05-29: qty 2

## 2016-05-29 MED ORDER — SODIUM CHLORIDE 0.9 % IV SOLN
INTRAVENOUS | Status: DC
  Administered 2016-05-29: 07:00:00 via INTRAVENOUS

## 2016-05-29 MED ORDER — MOXIFLOXACIN HCL 0.5 % OP SOLN
OPHTHALMIC | Status: AC
  Administered 2016-05-29: 1 [drp] via OPHTHALMIC
  Filled 2016-05-29: qty 3

## 2016-05-29 MED ORDER — MIDAZOLAM HCL 5 MG/5ML IJ SOLN
INTRAMUSCULAR | Status: DC | PRN
Start: 2016-05-29 — End: 2016-05-29
  Administered 2016-05-29: 1 mg via INTRAVENOUS

## 2016-05-29 MED ORDER — PHENYLEPHRINE HCL 10 % OP SOLN
1.0000 [drp] | OPHTHALMIC | Status: AC
  Administered 2016-05-29 (×4): 1 [drp] via OPHTHALMIC

## 2016-05-29 MED ORDER — POVIDONE-IODINE 5 % OP SOLN
OPHTHALMIC | Status: DC | PRN
  Administered 2016-05-29: 1 via OPHTHALMIC

## 2016-05-29 MED ORDER — TETRACAINE HCL 0.5 % OP SOLN
OPHTHALMIC | Status: AC
  Filled 2016-05-29: qty 2

## 2016-05-29 MED ORDER — LIDOCAINE HCL (PF) 4 % IJ SOLN
INTRAMUSCULAR | Status: DC | PRN
  Administered 2016-05-29: 4 mL via OPHTHALMIC

## 2016-05-29 MED ORDER — NA CHONDROIT SULF-NA HYALURON 40-17 MG/ML IO SOLN
INTRAOCULAR | Status: DC | PRN
  Administered 2016-05-29: 1 mL via INTRAOCULAR

## 2016-05-29 MED ORDER — LIDOCAINE HCL (PF) 4 % IJ SOLN
INTRAOCULAR | Status: DC | PRN
  Administered 2016-05-29: 2.25 mL via OPHTHALMIC

## 2016-05-29 MED ORDER — HYALURONIDASE HUMAN 150 UNIT/ML IJ SOLN
INTRAMUSCULAR | Status: AC
Start: 2016-05-29 — End: 2016-05-29
  Filled 2016-05-29: qty 1

## 2016-05-29 MED ORDER — LIDOCAINE HCL (PF) 4 % IJ SOLN
INTRAMUSCULAR | Status: AC
Start: 2016-05-29 — End: 2016-05-29
  Filled 2016-05-29: qty 5

## 2016-05-29 MED ORDER — CYCLOPENTOLATE HCL 2 % OP SOLN
1.0000 [drp] | OPHTHALMIC | Status: AC
  Administered 2016-05-29 (×4): 1 [drp] via OPHTHALMIC

## 2016-05-29 MED ORDER — PHENYLEPHRINE HCL 10 % OP SOLN
OPHTHALMIC | Status: AC
  Administered 2016-05-29: 1 [drp] via OPHTHALMIC
  Filled 2016-05-29: qty 5

## 2016-05-29 SURGICAL SUPPLY — 30 items
CANNULA ANT/CHMB 27GA (MISCELLANEOUS) ×3 IMPLANT
CORD BIP STRL DISP 12FT (MISCELLANEOUS) ×3 IMPLANT
CUP MEDICINE 2OZ PLAST GRAD ST (MISCELLANEOUS) ×3 IMPLANT
DRAPE XRAY CASSETTE 23X24 (DRAPES) ×3 IMPLANT
ERASER HMR WETFIELD 18G (MISCELLANEOUS) ×3 IMPLANT
GLOVE BIO SURGEON STRL SZ8 (GLOVE) ×3 IMPLANT
GLOVE SURG LX 6.5 MICRO (GLOVE) ×2
GLOVE SURG LX 8.0 MICRO (GLOVE) ×2
GLOVE SURG LX STRL 6.5 MICRO (GLOVE) ×1 IMPLANT
GLOVE SURG LX STRL 8.0 MICRO (GLOVE) ×1 IMPLANT
GOWN STRL REUS W/ TWL LRG LVL3 (GOWN DISPOSABLE) ×1 IMPLANT
GOWN STRL REUS W/ TWL XL LVL3 (GOWN DISPOSABLE) ×1 IMPLANT
GOWN STRL REUS W/TWL LRG LVL3 (GOWN DISPOSABLE) ×2
GOWN STRL REUS W/TWL XL LVL3 (GOWN DISPOSABLE) ×2
LENS IOL ACRSF IQ ULTRA 19.0 (Intraocular Lens) ×1 IMPLANT
LENS IOL ACRYSOF IQ 19.0 (Intraocular Lens) ×3 IMPLANT
PACK CATARACT (MISCELLANEOUS) ×3 IMPLANT
PACK CATARACT DINGLEDEIN LX (MISCELLANEOUS) ×3 IMPLANT
PACK EYE AFTER SURG (MISCELLANEOUS) ×3 IMPLANT
SHLD EYE VISITEC  UNIV (MISCELLANEOUS) ×3 IMPLANT
SOL BSS BAG (MISCELLANEOUS) ×3
SOL PREP PVP 2OZ (MISCELLANEOUS) ×3
SOLUTION BSS BAG (MISCELLANEOUS) ×1 IMPLANT
SOLUTION PREP PVP 2OZ (MISCELLANEOUS) ×1 IMPLANT
SUT SILK 5-0 (SUTURE) ×3 IMPLANT
SYR 3ML LL SCALE MARK (SYRINGE) ×3 IMPLANT
SYR 5ML LL (SYRINGE) ×3 IMPLANT
SYR TB 1ML 27GX1/2 LL (SYRINGE) ×3 IMPLANT
WATER STERILE IRR 250ML POUR (IV SOLUTION) ×3 IMPLANT
WIPE NON LINTING 3.25X3.25 (MISCELLANEOUS) ×3 IMPLANT

## 2016-05-29 NOTE — Transfer of Care (Signed)
Immediate Anesthesia Transfer of Care Note  Patient: Connor Hall  Procedure(s) Performed: Procedure(s) with comments: CATARACT EXTRACTION PHACO AND INTRAOCULAR LENS PLACEMENT (IOC) (Left) - [place] 01:16 AP% 23.3 CDE 31.18 Fluid pack lot # [id number]  Patient Location: PACU  Anesthesia Type:MAC  Level of Consciousness: awake, alert , oriented and patient cooperative  Airway & Oxygen Therapy: Patient Spontanous Breathing  Post-op Assessment: Report given to RN, Post -op Vital signs reviewed and stable and Patient moving all extremities X 4  Post vital signs: Reviewed and stable  Last Vitals:  Vitals:   05/29/16 0820 05/29/16 0822  BP: (!) 143/69 (!) 143/69  Pulse: (!) 53 (!) 53  Resp: 18 14  Temp: 36.5 C 36.5 C    Last Pain:  Vitals:   05/29/16 0820  TempSrc: Temporal         Complications: No apparent anesthesia complications

## 2016-05-29 NOTE — Op Note (Signed)
Date of Surgery: 05/29/2016 Date of Dictation: 05/29/2016 8:18 AM Pre-operative Diagnosis:  Nuclear Sclerotic Cataract left Eye Post-operative Diagnosis: same Procedure performed: Extra-capsular Cataract Extraction (ECCE) with placement of a posterior chamber intraocular lens (IOL) left Eye IOL:  Implant Name Type Inv. Item Serial No. Manufacturer Lot No. LRB No. Used  LENS IOL ACRYSOF IQ 19.0 - [id number] Intraocular Lens LENS IOL ACRYSOF IQ 19.0 [id number] ALCON   Left 1   Anesthesia: 2% Lidocaine and 4% Marcaine in a 50/50 mixture with 10 unites/ml of Hylenex given as a peribulbar Anesthesiologist: Anesthesiologist: Martha Clan, MD CRNA: Silvana Newness, CRNA Complications: none Estimated Blood Loss: less than 1 ml  Description of procedure:  The patient was given anesthesia and sedation via intravenous access. The patient was then prepped and draped in the usual fashion. A 25-gauge needle was bent for initiating the capsulorhexis. A 5-0 silk suture was placed through the conjunctiva superior and inferiorly to serve as bridle sutures. Hemostasis was obtained at the superior limbus using an eraser cautery. A partial thickness groove was made at the anterior surgical limbus with a 64 Beaver blade and this was dissected anteriorly with an Avaya. The anterior chamber was entered at 10 o'clock with a 1.0 mm paracentesis knife and through the lamellar dissection with a 2.6 mm Alcon keratome. Epi-Shugarcaine 0.5 CC [9 cc BSS Plus (Alcon), 3 cc 4% preservative-free lidocaine (Hospira) and 4 cc 1:1000 preservative-free, bisulfite-free epinephrine] was injected into the anterior chamber via the paracentesis tract. Epi-Shugarcaine 0.5 CC [9 cc BSS Plus (Alcon), 3 cc 4% preservative-free lidocaine (Hospira) and 4 cc 1:1000 preservative-free, bisulfite-free epinephrine] was injected into the anterior chamber via the paracentesis tract. DiscoVisc was injected to replace the aqueous and a  continuous tear curvilinear capsulorhexis was performed using a bent 25-gauge needle.  Balance salt on a syringe was used to perform hydro-dissection and phacoemulsification was carried out using a divide and conquer technique. Procedure(s) with comments: CATARACT EXTRACTION PHACO AND INTRAOCULAR LENS PLACEMENT (IOC) (Left) - [place] 01:16 AP% 23.3 CDE 31.18 Fluid pack lot # [id number]. Irrigation/aspiration was used to remove the residual cortex and the capsular bag was inflated with DiscoVisc. The intraocular lens was inserted into the capsular bag using a pre-loaded UltraSert Delivery System. Irrigation/aspiration was used to remove the residual DiscoVisc. The wound was inflated with balanced salt and checked for leaks. None were found. Miostat was injected via the paracentesis track and 0.1 ml of Vigamox containing 1 mg of drug  was injected via the paracentesis track. The wound was checked for leaks again and none were found.   The bridal sutures were removed and two drops of Vigamox were placed on the eye. An eye shield was placed to protect the eye and the patient was discharged to the recovery area in good condition.   Rosaisela Jamroz MD

## 2016-05-29 NOTE — Discharge Instructions (Signed)
Eye Surgery Discharge Instructions  Expect mild scratchy sensation or mild soreness. DO NOT RUB YOUR EYE!  The day of surgery:  Minimal physical activity, but bed rest is not required  No reading, computer work, or close hand work  No bending, lifting, or straining.  May watch TV  For 24 hours:  No driving, legal decisions, or alcoholic beverages  Safety precautions  Eat anything you prefer: It is better to start with liquids, then soup then solid foods.  _____ Eye patch should be worn until postoperative exam tomorrow.  ____ Solar shield eyeglasses should be worn for comfort in the sunlight/patch while sleeping  Resume all regular medications including aspirin or Coumadin if these were discontinued prior to surgery. You may shower, bathe, shave, or wash your hair. Tylenol may be taken for mild discomfort.  Call your doctor if you experience significant pain, nausea, or vomiting, fever > 101 or other signs of infection. [phone] or [phone] Specific instructions:  Follow-up Information    Chelsei Mcchesney, MD Follow up on 05/30/2016.   Specialty:  Ophthalmology Why:  10:45 am Contact information: [address]   [place] [address] [phone]

## 2016-05-29 NOTE — Anesthesia Preprocedure Evaluation (Signed)
Anesthesia Evaluation  Patient identified by MRN, date of birth, ID band Patient awake    Reviewed: Allergy & Precautions, NPO status , Patient's Chart, lab work & pertinent test results  History of Anesthesia Complications Negative for: history of anesthetic complications  Airway Mallampati: II  TM Distance: >3 FB Neck ROM: Full    Dental no notable dental hx.    Pulmonary asthma (primarily around smoke, has not had problems in years) , sleep apnea , former smoker,    breath sounds clear to auscultation- rhonchi (-) wheezing      Cardiovascular Exercise Tolerance: Good hypertension, Pt. on medications (-) CAD and (-) Past MI  Rhythm:Regular Rate:Normal - Systolic murmurs and - Diastolic murmurs    Neuro/Psych PSYCHIATRIC DISORDERS Anxiety Depression negative neurological ROS     GI/Hepatic Neg liver ROS, hiatal hernia, GERD  ,  Endo/Other  diabetes, Type 2, Oral Hypoglycemic Agents  Renal/GU negative Renal ROS     Musculoskeletal  (+) Arthritis ,   Abdominal (+) - obese,   Peds  Hematology negative hematology ROS (+)   Anesthesia Other Findings Past Medical History: No date: Anxiety No date: Arthritis No date: Asthma     Comment: WELL CONTROLLED No date: Cancer ([place])     Comment: MELANOMA (NOSE, FACE AND HANDS), BASAL CELL No date: Cervicalgia No date: Depression No date: Diabetes mellitus without complication (HCC) No date: GERD (gastroesophageal reflux disease) No date: History of hiatal hernia No date: Hyperlipidemia No date: Hypertension No date: Sleep apnea     Comment: DOES NOT USE CPAP   Reproductive/Obstetrics                             Anesthesia Physical  Anesthesia Plan  ASA: II  Anesthesia Plan: MAC   Post-op Pain Management:    Induction: Intravenous  Airway Management Planned: Natural Airway  Additional Equipment:   Intra-op Plan:   Post-operative  Plan:   Informed Consent: I have reviewed the patients History and Physical, chart, labs and discussed the procedure including the risks, benefits and alternatives for the proposed anesthesia with the patient or authorized representative who has indicated his/her understanding and acceptance.   Dental advisory given  Plan Discussed with: CRNA and Anesthesiologist  Anesthesia Plan Comments:         Anesthesia Quick Evaluation

## 2016-05-29 NOTE — Anesthesia Post-op Follow-up Note (Cosign Needed)
Anesthesia QCDR form completed.        

## 2016-05-29 NOTE — Interval H&P Note (Signed)
History and Physical Interval Note:  05/29/2016 7:26 AM  Connor Hall  has presented today for surgery, with the diagnosis of nuclear slerotic cataract left eye  The various methods of treatment have been discussed with the patient and family. After consideration of risks, benefits and other options for treatment, the patient has consented to  Procedure(s) with comments: CATARACT EXTRACTION PHACO AND INTRAOCULAR LENS PLACEMENT (IOC) (Left) - [place] AP% CDE Fluid pack lot # [id number] as a surgical intervention .  The patient's history has been reviewed, patient examined, no change in status, stable for surgery.  I have reviewed the patient's chart and labs.  Questions were answered to the patient's satisfaction.     Stanford Strauch

## 2016-05-29 NOTE — Anesthesia Postprocedure Evaluation (Signed)
Anesthesia Post Note  Patient: Connor Hall  Procedure(s) Performed: Procedure(s) (LRB): CATARACT EXTRACTION PHACO AND INTRAOCULAR LENS PLACEMENT (IOC) (Left)  Patient location during evaluation: PACU Anesthesia Type: MAC Level of consciousness: awake and alert Pain management: pain level controlled Vital Signs Assessment: post-procedure vital signs reviewed and stable Respiratory status: spontaneous breathing, nonlabored ventilation and respiratory function stable Cardiovascular status: stable and blood pressure returned to baseline Anesthetic complications: no     Last Vitals:  Vitals:   05/29/16 0820 05/29/16 0822  BP: (!) 143/69 (!) 143/69  Pulse: (!) 53 (!) 53  Resp: 18 14  Temp: 36.5 C 36.5 C    Last Pain:  Vitals:   05/29/16 0820  TempSrc: Temporal                 Silvana Newness A

## 2016-05-29 NOTE — H&P (Signed)
See scanned note.

## 2016-10-11 DIAGNOSIS — D649 Anemia, unspecified: Secondary | ICD-10-CM | POA: Insufficient documentation

## 2016-10-11 DIAGNOSIS — D509 Iron deficiency anemia, unspecified: Secondary | ICD-10-CM | POA: Insufficient documentation

## 2016-10-11 DIAGNOSIS — Z8371 Family history of colonic polyps: Secondary | ICD-10-CM | POA: Insufficient documentation

## 2017-01-14 ENCOUNTER — Encounter: Payer: Self-pay | Admitting: *Deleted

## 2017-01-15 ENCOUNTER — Ambulatory Visit: Payer: Medicare HMO | Admitting: Anesthesiology

## 2017-01-15 ENCOUNTER — Encounter
Admission: RE | Disposition: A | Discharge status: Home / Self Care | Payer: Self-pay | Source: Ambulatory Visit | Attending: Unknown Physician Specialty

## 2017-01-15 ENCOUNTER — Encounter: Payer: Self-pay | Admitting: *Deleted

## 2017-01-15 ENCOUNTER — Ambulatory Visit
Admission: RE | Admit: 2017-01-15 | Discharge: 2017-01-15 | Disposition: A | Discharge status: Home / Self Care | Payer: Medicare HMO | Source: Ambulatory Visit | Attending: Unknown Physician Specialty | Admitting: Unknown Physician Specialty

## 2017-01-15 DIAGNOSIS — K640 First degree hemorrhoids: Secondary | ICD-10-CM | POA: Insufficient documentation

## 2017-01-15 DIAGNOSIS — F329 Major depressive disorder, single episode, unspecified: Secondary | ICD-10-CM | POA: Insufficient documentation

## 2017-01-15 DIAGNOSIS — I10 Essential (primary) hypertension: Secondary | ICD-10-CM | POA: Diagnosis not present

## 2017-01-15 DIAGNOSIS — D509 Iron deficiency anemia, unspecified: Secondary | ICD-10-CM | POA: Diagnosis not present

## 2017-01-15 DIAGNOSIS — Z7984 Long term (current) use of oral hypoglycemic drugs: Secondary | ICD-10-CM | POA: Insufficient documentation

## 2017-01-15 DIAGNOSIS — K5730 Diverticulosis of large intestine without perforation or abscess without bleeding: Secondary | ICD-10-CM | POA: Diagnosis not present

## 2017-01-15 DIAGNOSIS — Z8371 Family history of colonic polyps: Secondary | ICD-10-CM | POA: Insufficient documentation

## 2017-01-15 DIAGNOSIS — K2980 Duodenitis without bleeding: Secondary | ICD-10-CM | POA: Diagnosis not present

## 2017-01-15 DIAGNOSIS — E119 Type 2 diabetes mellitus without complications: Secondary | ICD-10-CM | POA: Insufficient documentation

## 2017-01-15 DIAGNOSIS — E785 Hyperlipidemia, unspecified: Secondary | ICD-10-CM | POA: Diagnosis not present

## 2017-01-15 DIAGNOSIS — R51 Headache: Secondary | ICD-10-CM | POA: Insufficient documentation

## 2017-01-15 DIAGNOSIS — Z1211 Encounter for screening for malignant neoplasm of colon: Secondary | ICD-10-CM | POA: Insufficient documentation

## 2017-01-15 DIAGNOSIS — F419 Anxiety disorder, unspecified: Secondary | ICD-10-CM | POA: Diagnosis not present

## 2017-01-15 DIAGNOSIS — G4730 Sleep apnea, unspecified: Secondary | ICD-10-CM | POA: Insufficient documentation

## 2017-01-15 DIAGNOSIS — M1990 Unspecified osteoarthritis, unspecified site: Secondary | ICD-10-CM | POA: Insufficient documentation

## 2017-01-15 DIAGNOSIS — D649 Anemia, unspecified: Secondary | ICD-10-CM | POA: Diagnosis not present

## 2017-01-15 DIAGNOSIS — Z85820 Personal history of malignant melanoma of skin: Secondary | ICD-10-CM | POA: Insufficient documentation

## 2017-01-15 DIAGNOSIS — K2970 Gastritis, unspecified, without bleeding: Secondary | ICD-10-CM | POA: Diagnosis not present

## 2017-01-15 DIAGNOSIS — Z87891 Personal history of nicotine dependence: Secondary | ICD-10-CM | POA: Insufficient documentation

## 2017-01-15 DIAGNOSIS — K219 Gastro-esophageal reflux disease without esophagitis: Secondary | ICD-10-CM | POA: Insufficient documentation

## 2017-01-15 DIAGNOSIS — J45909 Unspecified asthma, uncomplicated: Secondary | ICD-10-CM | POA: Insufficient documentation

## 2017-01-15 HISTORY — DX: Unspecified abdominal hernia without obstruction or gangrene: K46.9

## 2017-01-15 HISTORY — DX: Anemia, unspecified: D64.9

## 2017-01-15 HISTORY — DX: Headache: R51

## 2017-01-15 HISTORY — DX: Diverticulitis of intestine, part unspecified, without perforation or abscess without bleeding: K57.92

## 2017-01-15 HISTORY — PX: COLONOSCOPY WITH PROPOFOL: SHX5780

## 2017-01-15 HISTORY — DX: Headache, unspecified: R51.9

## 2017-01-15 HISTORY — PX: ESOPHAGOGASTRODUODENOSCOPY (EGD) WITH PROPOFOL: SHX5813

## 2017-01-15 LAB — GLUCOSE, CAPILLARY: Glucose-Capillary: 155 mg/dL — ABNORMAL HIGH (ref 65–99)

## 2017-01-15 SURGERY — ESOPHAGOGASTRODUODENOSCOPY (EGD) WITH PROPOFOL
Anesthesia: General

## 2017-01-15 MED ORDER — EPHEDRINE SULFATE 50 MG/ML IJ SOLN
INTRAMUSCULAR | Status: DC | PRN
  Administered 2017-01-15 (×2): 5 mg via INTRAVENOUS

## 2017-01-15 MED ORDER — PROPOFOL 10 MG/ML IV BOLUS
INTRAVENOUS | Status: AC
  Filled 2017-01-15: qty 60

## 2017-01-15 MED ORDER — PROPOFOL 10 MG/ML IV BOLUS
INTRAVENOUS | Status: DC | PRN
  Administered 2017-01-15: 60 mg via INTRAVENOUS

## 2017-01-15 MED ORDER — PROPOFOL 10 MG/ML IV BOLUS
INTRAVENOUS | Status: AC
  Filled 2017-01-15: qty 40

## 2017-01-15 MED ORDER — SODIUM CHLORIDE 0.9 % IV SOLN
INTRAVENOUS | Status: DC
  Administered 2017-01-15: 08:00:00 via INTRAVENOUS

## 2017-01-15 MED ORDER — SODIUM CHLORIDE 0.9 % IV SOLN
INTRAVENOUS | Status: DC
Start: 2017-01-15 — End: 2017-01-15
  Administered 2017-01-15: 1000 mL via INTRAVENOUS

## 2017-01-15 MED ORDER — PROPOFOL 500 MG/50ML IV EMUL
INTRAVENOUS | Status: DC | PRN
  Administered 2017-01-15: 100 ug/kg/min via INTRAVENOUS

## 2017-01-15 NOTE — Op Note (Signed)
[facility] Gastroenterology Patient Name: Connor Hall Procedure Date: 01/15/2017 7:33 AM MRN: [id number] Account #: [id number] Date of Birth: 07/23/1940 Admit Type: Outpatient Age: 76 Room: [facility] ENDO ROOM 3 Gender: Male Note Status: Finalized Procedure:            Upper GI endoscopy Indications:          Unexplained iron deficiency anemia Providers:            Manya Silvas, MD Referring MD:         Hewitt Blade. Sarina Ser, MD (Referring MD) Medicines:            Propofol per Anesthesia Complications:        No immediate complications. Procedure:            Pre-Anesthesia Assessment:                       - After reviewing the risks and benefits, the patient                        was deemed in satisfactory condition to undergo the                        procedure.                       After obtaining informed consent, the endoscope was                        passed under direct vision. Throughout the procedure,                        the patient's blood pressure, pulse, and oxygen                        saturations were monitored continuously. The Endoscope                        was introduced through the mouth, and advanced to the                        second part of duodenum. The upper GI endoscopy was                        accomplished without difficulty. The patient tolerated                        the procedure well. Findings:      The examined esophagus was normal. GEJ 40cm.      Diffuse mildly erythematous mucosa without bleeding was found in the       gastric body. Also antrum of stomach. No ulcers. Few proximal polyps of       no significance. Biopsies were taken with a cold forceps for histology.       Biopsies were taken with a cold forceps for Helicobacter pylori testing.      Patchy mildly erythematous mucosa without active bleeding and with no       stigmata of bleeding was found in the duodenal bulb. Biopsies were taken       with a cold  forceps for histology.      The second portion of the duodenum  was normal. Impression:           - Normal esophagus.                       - Erythematous mucosa in the gastric body. Biopsied.                       - Erythematous duodenopathy. Biopsied.                       - Normal second portion of the duodenum. Recommendation:       - Await pathology results. Manya Silvas, MD 01/15/2017 7:56:49 AM This report has been signed electronically. Number of Addenda: 0 Note Initiated On: 01/15/2017 7:33 AM      [facility]

## 2017-01-15 NOTE — Transfer of Care (Signed)
Immediate Anesthesia Transfer of Care Note  Patient: Connor Hall  Procedure(s) Performed: Procedure(s): ESOPHAGOGASTRODUODENOSCOPY (EGD) WITH PROPOFOL (N/A) COLONOSCOPY WITH PROPOFOL (N/A)  Patient Location: PACU  Anesthesia Type:General  Level of Consciousness: awake, sedated, drowsy and patient cooperative  Airway & Oxygen Therapy: Patient Spontanous Breathing  Post-op Assessment: Report given to RN, Post -op Vital signs reviewed and stable and Patient moving all extremities  Post vital signs: Reviewed and stable  Last Vitals:  Vitals:   01/15/17 0810 01/15/17 0820  BP:  108/63  Pulse: 66 63  Resp: 14 14  Temp: (!) 36.2 C   SpO2: 98% 97%    Last Pain:  Vitals:   01/15/17 0810  TempSrc: Tympanic         Complications: No apparent anesthesia complications

## 2017-01-15 NOTE — Anesthesia Preprocedure Evaluation (Signed)
Anesthesia Evaluation  Patient identified by MRN, date of birth, ID band Patient awake    Reviewed: Allergy & Precautions, H&P , NPO status , Patient's Chart, lab work & pertinent test results  History of Anesthesia Complications Negative for: history of anesthetic complications  Airway Mallampati: III  TM Distance: <3 FB Neck ROM: full    Dental  (+) Poor Dentition, Chipped, Missing, Caps   Pulmonary neg shortness of breath, asthma , sleep apnea , former smoker,           Cardiovascular Exercise Tolerance: Good hypertension, (-) angina(-) Past MI      Neuro/Psych  Headaches, PSYCHIATRIC DISORDERS Anxiety Depression    GI/Hepatic Neg liver ROS, hiatal hernia, GERD  Medicated and Controlled,  Endo/Other  diabetes, Type 2, Oral Hypoglycemic Agents  Renal/GU negative Renal ROS  negative genitourinary   Musculoskeletal  (+) Arthritis ,   Abdominal   Peds  Hematology negative hematology ROS (+)   Anesthesia Other Findings Past Medical History: No date: Anemia No date: Anxiety No date: Arthritis No date: Asthma     Comment:  WELL CONTROLLED No date: Cancer ([place])     Comment:  MELANOMA (NOSE, FACE AND HANDS), BASAL CELL No date: Cervicalgia No date: Depression No date: Diabetes mellitus without complication (HCC) No date: Diverticulitis No date: GERD (gastroesophageal reflux disease) No date: Headache No date: Hernia of abdominal cavity No date: History of hiatal hernia No date: Hyperlipidemia No date: Hypertension No date: Sleep apnea     Comment:  DOES NOT USE CPAP  Past Surgical History: No date: [place]: BACK SURGERY     Comment:  X3 04/03/2016: CATARACT EXTRACTION W/PHACO; Right     Comment:  Procedure: CATARACT EXTRACTION PHACO AND INTRAOCULAR               LENS PLACEMENT ([place]);  Surgeon: Estill Cotta, MD;                Location: [facility] ORS;  Service: Ophthalmology;   Laterality:              Right;  [place] 1.37 AP% 25.8 CDE 44.67 Fluid pack lot #               [id number] 05/29/2016: CATARACT EXTRACTION W/PHACO; Left     Comment:  Procedure: CATARACT EXTRACTION PHACO AND INTRAOCULAR               LENS PLACEMENT (IOC);  Surgeon: Estill Cotta, MD;                Location: [facility] ORS;  Service: Ophthalmology;  Laterality:              Left;  [place] 01:16 AP% 23.3 CDE 31.18 Fluid pack lot #               [id number] No date: COLONOSCOPY No date: ESOPHAGOGASTRODUODENOSCOPY 08/30/2015: KNEE ARTHROSCOPY; Left     Comment:  Procedure: ARTHROSCOPY KNEE LEFT, partial medial               menisectomy, chondral debridement;  Surgeon: Leanor Kail, MD;  Location: [facility] ORS;  Service: Orthopedics;              Laterality: Left; No date: MOHS SURGERY No date: POLYPECTOMY No date: ROTATOR CUFF REPAIR; Right No date: TRIGGER FINGER RELEASE     Reproductive/Obstetrics negative OB ROS  Anesthesia Physical Anesthesia Plan  ASA: III  Anesthesia Plan: General   Post-op Pain Management:    Induction: Intravenous  PONV Risk Score and Plan: Propofol infusion  Airway Management Planned: Natural Airway and Nasal Cannula  Additional Equipment:   Intra-op Plan:   Post-operative Plan:   Informed Consent: I have reviewed the patients History and Physical, chart, labs and discussed the procedure including the risks, benefits and alternatives for the proposed anesthesia with the patient or authorized representative who has indicated his/her understanding and acceptance.   Dental Advisory Given  Plan Discussed with: Anesthesiologist, CRNA and Surgeon  Anesthesia Plan Comments: (Patient consented for risks of anesthesia including but not limited to:  - adverse reactions to medications - risk of intubation if required - damage to teeth, lips or other oral mucosa - sore throat or hoarseness - Damage  to heart, brain, lungs or loss of life  Patient voiced understanding.)        Anesthesia Quick Evaluation

## 2017-01-15 NOTE — Anesthesia Postprocedure Evaluation (Signed)
Anesthesia Post Note  Patient: Connor Hall  Procedure(s) Performed: Procedure(s) (LRB): ESOPHAGOGASTRODUODENOSCOPY (EGD) WITH PROPOFOL (N/A) COLONOSCOPY WITH PROPOFOL (N/A)  Patient location during evaluation: Endoscopy Anesthesia Type: General Level of consciousness: awake and alert Pain management: pain level controlled Vital Signs Assessment: post-procedure vital signs reviewed and stable Respiratory status: spontaneous breathing, nonlabored ventilation, respiratory function stable and patient connected to nasal cannula oxygen Cardiovascular status: blood pressure returned to baseline and stable Postop Assessment: no signs of nausea or vomiting Anesthetic complications: no     Last Vitals:  Vitals:   01/15/17 0830 01/15/17 0840  BP: 126/70 131/74  Pulse: 67 67  Resp: 15 17  Temp:    SpO2: 97% 98%    Last Pain:  Vitals:   01/15/17 0810  TempSrc: Tympanic                 Connor Hall

## 2017-01-15 NOTE — Op Note (Signed)
[facility] Gastroenterology Patient Name: Connor Hall Procedure Date: 01/15/2017 7:33 AM MRN: [id number] Account #: [id number] Date of Birth: 1940-07-27 Admit Type: Outpatient Age: 76 Room: [facility] ENDO ROOM 3 Gender: Male Note Status: Finalized Procedure:            Colonoscopy Indications:          Colon cancer screening in patient at increased risk:                        Family history of 1st-degree relative with colon polyps Providers:            Manya Silvas, MD Referring MD:         Hewitt Blade. Sarina Ser, MD (Referring MD) Medicines:            Propofol per Anesthesia Complications:        No immediate complications. Procedure:            Pre-Anesthesia Assessment:                       - After reviewing the risks and benefits, the patient                        was deemed in satisfactory condition to undergo the                        procedure.                       After obtaining informed consent, the colonoscope was                        passed under direct vision. Throughout the procedure,                        the patient's blood pressure, pulse, and oxygen                        saturations were monitored continuously. The                        Colonoscope was introduced through the anus and                        advanced to the the cecum, identified by appendiceal                        orifice and ileocecal valve. The colonoscopy was                        performed without difficulty. The patient tolerated the                        procedure well. The quality of the bowel preparation                        was good. Findings:      Multiple small and large-mouthed diverticula were found in the sigmoid       colon and descending colon.      Internal hemorrhoids were found during endoscopy. The hemorrhoids were       medium-sized  and Grade I (internal hemorrhoids that do not prolapse).      The exam was otherwise without  abnormality. Impression:           - Diverticulosis in the sigmoid colon and in the                        descending colon.                       - Internal hemorrhoids.                       - The examination was otherwise normal.                       - No specimens collected. Recommendation:       - The findings and recommendations were discussed with                        the patient's family. Manya Silvas, MD 01/15/2017 8:17:15 AM This report has been signed electronically. Number of Addenda: 0 Note Initiated On: 01/15/2017 7:33 AM Scope Withdrawal Time: 0 hours 9 minutes 49 seconds  Total Procedure Duration: 0 hours 16 minutes 17 seconds       [facility]

## 2017-01-15 NOTE — H&P (Signed)
Primary Care Physician:  Madelyn Brunner, MD Primary Gastroenterologist:  Dr. Vira Agar  Pre-Procedure History & Physical: HPI:  Connor Hall is a 76 y.o. male is here for an endoscopy and colonoscopy.   Past Medical History:  Diagnosis Date  . Anemia   . Anxiety   . Arthritis   . Asthma    WELL CONTROLLED  . Cancer (HCC)    MELANOMA (NOSE, FACE AND HANDS), BASAL CELL  . Cervicalgia   . Depression   . Diabetes mellitus without complication ([place])   . Diverticulitis   . GERD (gastroesophageal reflux disease)   . Headache   . Hernia of abdominal cavity   . History of hiatal hernia   . Hyperlipidemia   . Hypertension   . Sleep apnea    DOES NOT USE CPAP    Past Surgical History:  Procedure Laterality Date  . APPENDECTOMY    . [place]   X3  . CATARACT EXTRACTION W/PHACO Right 04/03/2016   Procedure: CATARACT EXTRACTION PHACO AND INTRAOCULAR LENS PLACEMENT (IOC);  Surgeon: Estill Cotta, MD;  Location: [facility] ORS;  Service: Ophthalmology;  Laterality: Right;  [place] 1.37 AP% 25.8 CDE 44.67 Fluid pack lot # [id number]  . CATARACT EXTRACTION W/PHACO Left 05/29/2016   Procedure: CATARACT EXTRACTION PHACO AND INTRAOCULAR LENS PLACEMENT (IOC);  Surgeon: Estill Cotta, MD;  Location: [facility];  Service: Ophthalmology;  Laterality: Left;  [place] 01:16 AP% 23.3 CDE 31.18 Fluid pack lot # [id number]  . COLONOSCOPY    . ESOPHAGOGASTRODUODENOSCOPY    . KNEE ARTHROSCOPY Left 08/30/2015   Procedure: ARTHROSCOPY KNEE LEFT, partial medial menisectomy, chondral debridement;  Surgeon: Leanor Kail, MD;  Location: [facility] ORS;  Service: Orthopedics;  Laterality: Left;  . MOHS SURGERY    . POLYPECTOMY    . ROTATOR CUFF REPAIR Right   . TRIGGER FINGER RELEASE      Prior to Admission medications   Medication Sig Start Date End Date Taking? Authorizing Provider  amLODipine (NORVASC) 5 MG tablet Take 5 mg by mouth every morning.   Yes [provider]   hydrochlorothiazide (HYDRODIURIL) 25 MG tablet Take 25 mg by mouth daily.   Yes [provider]  glipiZIDE (GLUCOTROL XL) 10 MG 24 hr tablet Take 10 mg by mouth daily with breakfast.    [provider]  LORazepam (ATIVAN) 0.5 MG tablet Take 0.5 mg by mouth every 8 (eight) hours.    [provider]  meloxicam (MOBIC) 7.5 MG tablet Take 7.5 mg by mouth 2 (two) times daily.    [provider]  metFORMIN (GLUCOPHAGE) 1000 MG tablet Take 1,000 mg by mouth 2 (two) times daily with a meal.    [provider]  mometasone (NASONEX) 50 MCG/ACT nasal spray Place 2 sprays into the nose daily.    [provider]  Multiple Vitamins-Minerals (CENTRUM SILVER ULTRA MENS) TABS Take 1 tablet by mouth daily.    [provider]  omeprazole (PRILOSEC) 20 MG capsule Take 20 mg by mouth every morning.    [provider]  pravastatin (PRAVACHOL) 80 MG tablet Take 80 mg by mouth at bedtime.    [provider]  traMADol (ULTRAM) 50 MG tablet Take 50 mg by mouth every 6 (six) hours as needed.    [provider]    Allergies as of 10/16/2016 - Review Complete 05/29/2016  Allergen Reaction Noted  . Penicillins  08/28/2015  . Aspirin Rash 08/28/2015  . Other  Palpitations 05/29/2016    History reviewed. No pertinent family history.  Social History   Social History  . Marital status: Married    Spouse name: N/A  . Number of children: N/A  . Years of education: N/A   Occupational History  . Not on file.   Social History Main Topics  . Smoking status: Former Smoker    Packs/day: 0.50    Years: 13.00    Types: Cigarettes    Quit date: 08/28/1975  . Smokeless tobacco: Never Used  . Alcohol use No  . Drug use: No  . Sexual activity: Not on file   Other Topics Concern  . Not on file   Social History Narrative  . No narrative on file    Review of Systems: See HPI, otherwise negative ROS  Physical Exam: BP 138/69    Pulse 64   Temp 97.7 F (36.5 C) (Tympanic)   Resp 16   Ht 6' (1.829 m)   Wt 96.2 kg (212 lb)   SpO2 97%   BMI 28.75 kg/m  General:   Alert,  pleasant and cooperative in NAD Head:  Normocephalic and atraumatic. Neck:  Supple; no masses or thyromegaly. Lungs:  Clear throughout to auscultation.    Heart:  Regular rate and rhythm. Abdomen:  Soft, nontender and nondistended. Normal bowel sounds, without guarding, and without rebound.   Neurologic:  Alert and  oriented x4;  grossly normal neurologically.  Impression/Plan: Connor Hall is here for an endoscopy and colonoscopy to be performed for iron def anemia and FH colon polyps.  Risks, benefits, limitations, and alternatives regarding  endoscopy and colonoscopy have been reviewed with the patient.  Questions have been answered.  All parties agreeable.   Gaylyn Cheers, MD  01/15/2017, 7:31 AM

## 2017-01-15 NOTE — Anesthesia Post-op Follow-up Note (Signed)
Anesthesia QCDR form completed.        

## 2017-01-16 ENCOUNTER — Encounter: Payer: Self-pay | Admitting: Unknown Physician Specialty

## 2017-01-16 LAB — SURGICAL PATHOLOGY

## 2017-04-17 ENCOUNTER — Encounter: Payer: Self-pay | Admitting: Emergency Medicine

## 2017-04-17 ENCOUNTER — Emergency Department
Admission: EM | Admit: 2017-04-17 | Discharge: 2017-04-17 | Disposition: A | Discharge status: Home / Self Care | Payer: Medicare HMO | Attending: Emergency Medicine | Admitting: Emergency Medicine

## 2017-04-17 DIAGNOSIS — R55 Syncope and collapse: Secondary | ICD-10-CM | POA: Diagnosis not present

## 2017-04-17 DIAGNOSIS — Z5321 Procedure and treatment not carried out due to patient leaving prior to being seen by health care provider: Secondary | ICD-10-CM | POA: Insufficient documentation

## 2017-04-17 LAB — BASIC METABOLIC PANEL
ANION GAP: 9 (ref 5–15)
BUN: 21 mg/dL — ABNORMAL HIGH (ref 6–20)
CALCIUM: 9.5 mg/dL (ref 8.9–10.3)
CO2: 28 mmol/L (ref 22–32)
Chloride: 97 mmol/L — ABNORMAL LOW (ref 101–111)
Creatinine, Ser: 1.11 mg/dL (ref 0.61–1.24)
Glucose, Bld: 166 mg/dL — ABNORMAL HIGH (ref 65–99)
Potassium: 4 mmol/L (ref 3.5–5.1)
Sodium: 134 mmol/L — ABNORMAL LOW (ref 135–145)

## 2017-04-17 LAB — CBC
HCT: 38.6 % — ABNORMAL LOW (ref 40.0–52.0)
HEMOGLOBIN: 13 g/dL (ref 13.0–18.0)
MCH: 30.8 pg (ref 26.0–34.0)
MCHC: 33.7 g/dL (ref 32.0–36.0)
MCV: 91.4 fL (ref 80.0–100.0)
Platelets: 186 10*3/uL (ref 150–440)
RBC: 4.22 MIL/uL — AB (ref 4.40–5.90)
RDW: 13.3 % (ref 11.5–14.5)
WBC: 9.5 10*3/uL (ref 3.8–10.6)

## 2017-04-17 LAB — GLUCOSE, CAPILLARY: GLUCOSE-CAPILLARY: 155 mg/dL — AB (ref 65–99)

## 2017-04-17 NOTE — ED Notes (Signed)
First nurse note  Brought in via ems for near syncopal episode possible low blood sugar

## 2017-04-17 NOTE — ED Notes (Signed)
Spoke with family   Explained the wait

## 2017-04-17 NOTE — ED Notes (Signed)
Inform this nurse that he was leaving  Explained the wait again

## 2017-04-17 NOTE — ED Triage Notes (Signed)
Pt comes into the ED via [facility] from the drug store where he had a syncopal episode.  Patient knew he was going to pass out and was able to sit in a chair before it happened.  Patient denies hitting his head, N/V, chest pain, or shortness of breath.  Patient states he had a spell of dizziness right before it occurred.  Patient in NAD at this time with even and unlabored respirations and is neurologically intact.

## 2017-08-01 DIAGNOSIS — G5602 Carpal tunnel syndrome, left upper limb: Secondary | ICD-10-CM | POA: Insufficient documentation

## 2017-08-21 DIAGNOSIS — M79642 Pain in left hand: Secondary | ICD-10-CM | POA: Insufficient documentation

## 2017-11-20 DIAGNOSIS — R0602 Shortness of breath: Secondary | ICD-10-CM | POA: Insufficient documentation

## 2017-11-24 ENCOUNTER — Encounter: Payer: Self-pay | Admitting: *Deleted

## 2017-11-24 ENCOUNTER — Ambulatory Visit
Admission: RE | Admit: 2017-11-24 | Discharge: 2017-11-24 | Disposition: A | Discharge status: Home / Self Care | Payer: Medicare HMO | Source: Ambulatory Visit | Attending: Internal Medicine | Admitting: Internal Medicine

## 2017-11-24 ENCOUNTER — Encounter
Admission: RE | Disposition: A | Discharge status: Home / Self Care | Payer: Self-pay | Source: Ambulatory Visit | Attending: Internal Medicine

## 2017-11-24 DIAGNOSIS — R51 Headache: Secondary | ICD-10-CM | POA: Insufficient documentation

## 2017-11-24 DIAGNOSIS — Z794 Long term (current) use of insulin: Secondary | ICD-10-CM | POA: Insufficient documentation

## 2017-11-24 DIAGNOSIS — I2089 Other forms of angina pectoris: Secondary | ICD-10-CM | POA: Diagnosis present

## 2017-11-24 DIAGNOSIS — E785 Hyperlipidemia, unspecified: Secondary | ICD-10-CM | POA: Diagnosis not present

## 2017-11-24 DIAGNOSIS — Z79899 Other long term (current) drug therapy: Secondary | ICD-10-CM | POA: Insufficient documentation

## 2017-11-24 DIAGNOSIS — M1990 Unspecified osteoarthritis, unspecified site: Secondary | ICD-10-CM | POA: Insufficient documentation

## 2017-11-24 DIAGNOSIS — K5790 Diverticulosis of intestine, part unspecified, without perforation or abscess without bleeding: Secondary | ICD-10-CM | POA: Insufficient documentation

## 2017-11-24 DIAGNOSIS — J45909 Unspecified asthma, uncomplicated: Secondary | ICD-10-CM | POA: Insufficient documentation

## 2017-11-24 DIAGNOSIS — R809 Proteinuria, unspecified: Secondary | ICD-10-CM | POA: Insufficient documentation

## 2017-11-24 DIAGNOSIS — I10 Essential (primary) hypertension: Secondary | ICD-10-CM | POA: Diagnosis not present

## 2017-11-24 DIAGNOSIS — Z85828 Personal history of other malignant neoplasm of skin: Secondary | ICD-10-CM | POA: Diagnosis not present

## 2017-11-24 DIAGNOSIS — E119 Type 2 diabetes mellitus without complications: Secondary | ICD-10-CM | POA: Insufficient documentation

## 2017-11-24 DIAGNOSIS — Z9849 Cataract extraction status, unspecified eye: Secondary | ICD-10-CM | POA: Diagnosis not present

## 2017-11-24 DIAGNOSIS — Z888 Allergy status to other drugs, medicaments and biological substances status: Secondary | ICD-10-CM | POA: Insufficient documentation

## 2017-11-24 DIAGNOSIS — I208 Other forms of angina pectoris: Secondary | ICD-10-CM | POA: Diagnosis present

## 2017-11-24 DIAGNOSIS — Z8371 Family history of colonic polyps: Secondary | ICD-10-CM | POA: Insufficient documentation

## 2017-11-24 DIAGNOSIS — K219 Gastro-esophageal reflux disease without esophagitis: Secondary | ICD-10-CM | POA: Diagnosis not present

## 2017-11-24 DIAGNOSIS — R9431 Abnormal electrocardiogram [ECG] [EKG]: Secondary | ICD-10-CM | POA: Insufficient documentation

## 2017-11-24 DIAGNOSIS — R0602 Shortness of breath: Secondary | ICD-10-CM | POA: Insufficient documentation

## 2017-11-24 DIAGNOSIS — F419 Anxiety disorder, unspecified: Secondary | ICD-10-CM | POA: Diagnosis not present

## 2017-11-24 DIAGNOSIS — G4730 Sleep apnea, unspecified: Secondary | ICD-10-CM | POA: Diagnosis not present

## 2017-11-24 DIAGNOSIS — R9430 Abnormal result of cardiovascular function study, unspecified: Secondary | ICD-10-CM

## 2017-11-24 DIAGNOSIS — I25119 Atherosclerotic heart disease of native coronary artery with unspecified angina pectoris: Secondary | ICD-10-CM | POA: Diagnosis not present

## 2017-11-24 DIAGNOSIS — Z833 Family history of diabetes mellitus: Secondary | ICD-10-CM | POA: Insufficient documentation

## 2017-11-24 DIAGNOSIS — Z8249 Family history of ischemic heart disease and other diseases of the circulatory system: Secondary | ICD-10-CM | POA: Insufficient documentation

## 2017-11-24 DIAGNOSIS — Z820 Family history of epilepsy and other diseases of the nervous system: Secondary | ICD-10-CM | POA: Insufficient documentation

## 2017-11-24 DIAGNOSIS — Z86010 Personal history of colonic polyps: Secondary | ICD-10-CM | POA: Diagnosis not present

## 2017-11-24 DIAGNOSIS — Z886 Allergy status to analgesic agent status: Secondary | ICD-10-CM | POA: Insufficient documentation

## 2017-11-24 DIAGNOSIS — Z880 Allergy status to penicillin: Secondary | ICD-10-CM | POA: Diagnosis not present

## 2017-11-24 DIAGNOSIS — D649 Anemia, unspecified: Secondary | ICD-10-CM | POA: Insufficient documentation

## 2017-11-24 DIAGNOSIS — Z87891 Personal history of nicotine dependence: Secondary | ICD-10-CM | POA: Diagnosis not present

## 2017-11-24 DIAGNOSIS — Z800 Family history of malignant neoplasm of digestive organs: Secondary | ICD-10-CM | POA: Insufficient documentation

## 2017-11-24 HISTORY — PX: LEFT HEART CATH AND CORONARY ANGIOGRAPHY: CATH118249

## 2017-11-24 HISTORY — DX: Dyspnea, unspecified: R06.00

## 2017-11-24 LAB — GLUCOSE, CAPILLARY: Glucose-Capillary: 151 mg/dL — ABNORMAL HIGH (ref 70–99)

## 2017-11-24 SURGERY — LEFT HEART CATH AND CORONARY ANGIOGRAPHY
Anesthesia: Moderate Sedation | Laterality: Left

## 2017-11-24 MED ORDER — SODIUM CHLORIDE 0.9 % WEIGHT BASED INFUSION: 1.0000 mL/kg/h | INTRAVENOUS | Status: DC

## 2017-11-24 MED ORDER — LIDOCAINE HCL (PF) 1 % IJ SOLN
INTRAMUSCULAR | Status: AC
  Filled 2017-11-24: qty 30

## 2017-11-24 MED ORDER — FENTANYL CITRATE (PF) 100 MCG/2ML IJ SOLN
INTRAMUSCULAR | Status: DC | PRN
  Administered 2017-11-24: 25 ug via INTRAVENOUS

## 2017-11-24 MED ORDER — HEPARIN (PORCINE) IN NACL 1000-0.9 UT/500ML-% IV SOLN
INTRAVENOUS | Status: AC
  Filled 2017-11-24: qty 500

## 2017-11-24 MED ORDER — ACETAMINOPHEN 325 MG PO TABS: 650.0000 mg | ORAL_TABLET | ORAL | Status: DC | PRN

## 2017-11-24 MED ORDER — ASPIRIN 81 MG PO CHEW: 81.0000 mg | CHEWABLE_TABLET | ORAL | Status: DC

## 2017-11-24 MED ORDER — SODIUM CHLORIDE 0.9 % IV SOLN: 250.0000 mL | INTRAVENOUS | Status: DC | PRN

## 2017-11-24 MED ORDER — ONDANSETRON HCL 4 MG/2ML IJ SOLN: 4.0000 mg | Freq: Four times a day (QID) | INTRAMUSCULAR | Status: DC | PRN

## 2017-11-24 MED ORDER — SODIUM CHLORIDE 0.9% FLUSH: 3.0000 mL | Freq: Two times a day (BID) | INTRAVENOUS | Status: DC

## 2017-11-24 MED ORDER — SODIUM CHLORIDE 0.9 % WEIGHT BASED INFUSION
3.0000 mL/kg/h | INTRAVENOUS | Status: AC
  Administered 2017-11-24: 3 mL/kg/h via INTRAVENOUS

## 2017-11-24 MED ORDER — MIDAZOLAM HCL 2 MG/2ML IJ SOLN
INTRAMUSCULAR | Status: DC | PRN
  Administered 2017-11-24: 1 mg via INTRAVENOUS

## 2017-11-24 MED ORDER — SODIUM CHLORIDE 0.9% FLUSH: 3.0000 mL | INTRAVENOUS | Status: DC | PRN

## 2017-11-24 MED ORDER — FENTANYL CITRATE (PF) 100 MCG/2ML IJ SOLN
INTRAMUSCULAR | Status: AC
  Filled 2017-11-24: qty 2

## 2017-11-24 MED ORDER — MIDAZOLAM HCL 2 MG/2ML IJ SOLN
INTRAMUSCULAR | Status: AC
  Filled 2017-11-24: qty 2

## 2017-11-24 SURGICAL SUPPLY — 9 items
CATH INFINITI 5FR ANG PIGTAIL (CATHETERS) ×3 IMPLANT
CATH INFINITI 5FR JL4 (CATHETERS) ×3 IMPLANT
CATH INFINITI JR4 5F (CATHETERS) ×3 IMPLANT
DEVICE CLOSURE MYNXGRIP 5F (Vascular Products) ×3 IMPLANT
KIT MANI 3VAL PERCEP (MISCELLANEOUS) ×3 IMPLANT
NEEDLE PERC 18GX7CM (NEEDLE) ×3 IMPLANT
PACK CARDIAC CATH (CUSTOM PROCEDURE TRAY) ×3 IMPLANT
SHEATH AVANTI 5FR X 11CM (SHEATH) ×3 IMPLANT
WIRE GUIDERIGHT .035X150 (WIRE) ×3 IMPLANT

## 2017-11-24 NOTE — Discharge Instructions (Signed)

## 2017-12-08 DIAGNOSIS — I2510 Atherosclerotic heart disease of native coronary artery without angina pectoris: Secondary | ICD-10-CM | POA: Insufficient documentation

## 2018-10-28 ENCOUNTER — Other Ambulatory Visit: Payer: Self-pay | Admitting: Internal Medicine

## 2018-10-28 DIAGNOSIS — R1031 Right lower quadrant pain: Secondary | ICD-10-CM

## 2018-11-02 ENCOUNTER — Ambulatory Visit
Admission: RE | Admit: 2018-11-02 | Discharge: 2018-11-02 | Disposition: A | Discharge status: Home / Self Care | Payer: Medicare HMO | Source: Ambulatory Visit | Attending: Internal Medicine | Admitting: Internal Medicine

## 2018-11-02 ENCOUNTER — Other Ambulatory Visit: Payer: Self-pay

## 2018-11-02 DIAGNOSIS — R1031 Right lower quadrant pain: Secondary | ICD-10-CM | POA: Diagnosis present

## 2018-11-02 LAB — POCT I-STAT CREATININE: Creatinine, Ser: 1.1 mg/dL (ref 0.61–1.24)

## 2018-11-02 MED ORDER — IOHEXOL 300 MG/ML  SOLN
100.0000 mL | Freq: Once | INTRAMUSCULAR | Status: AC | PRN
  Administered 2018-11-02: 100 mL via INTRAVENOUS

## 2018-11-19 ENCOUNTER — Encounter: Payer: Self-pay | Admitting: General Surgery

## 2018-11-19 ENCOUNTER — Other Ambulatory Visit: Payer: Self-pay

## 2018-11-19 ENCOUNTER — Ambulatory Visit: Payer: Medicare HMO | Admitting: General Surgery

## 2018-11-19 NOTE — Patient Instructions (Addendum)
Call our office if you decide to have the hernia's repaired. Umbilical Hernia, Adult  A hernia is a bulge of tissue that pushes through an opening between muscles. An umbilical hernia happens in the abdomen, near the belly button (umbilicus). The hernia may contain tissues from the small intestine, large intestine, or fatty tissue covering the intestines (omentum). Umbilical hernias in adults tend to get worse over time, and they require surgical treatment. There are several types of umbilical hernias. You may have:  A hernia located just above or below the umbilicus (indirect hernia). This is the most common type of umbilical hernia in adults.  A hernia that forms through an opening formed by the umbilicus (direct hernia).  A hernia that comes and goes (reducible hernia). A reducible hernia may be visible only when you strain, lift something heavy, or cough. This type of hernia can be pushed back into the abdomen (reduced).  A hernia that traps abdominal tissue inside the hernia (incarcerated hernia). This type of hernia cannot be reduced.  A hernia that cuts off blood flow to the tissues inside the hernia (strangulated hernia). The tissues can start to die if this happens. This type of hernia requires emergency treatment. What are the causes? An umbilical hernia happens when tissue inside the abdomen presses on a weak area of the abdominal muscles. What increases the risk? You may have a greater risk of this condition if you:  Are obese.  Have had several pregnancies.  Have a buildup of fluid inside your abdomen (ascites).  Have had surgery that weakens the abdominal muscles. What are the signs or symptoms? The main symptom of this condition is a painless bulge at or near the belly button. A reducible hernia may be visible only when you strain, lift something heavy, or cough. Other symptoms may include:  Dull pain.  A feeling of pressure. Symptoms of a strangulated hernia may  include:  Pain that gets increasingly worse.  Nausea and vomiting.  Pain when pressing on the hernia.  Skin over the hernia becoming red or purple.  Constipation.  Blood in the stool. How is this diagnosed? This condition may be diagnosed based on:  A physical exam. You may be asked to cough or strain while standing. These actions increase the pressure inside your abdomen and force the hernia through the opening in your muscles. Your health care provider may try to reduce the hernia by pressing on it.  Your symptoms and medical history. How is this treated? Surgery is the only treatment for an umbilical hernia. Surgery for a strangulated hernia is done as soon as possible. If you have a small hernia that is not incarcerated, you may need to lose weight before having surgery. Follow these instructions at home:  Lose weight, if told by your health care provider.  Do not try to push the hernia back in.  Watch your hernia for any changes in color or size. Tell your health care provider if any changes occur.  You may need to avoid activities that increase pressure on your hernia.  Do not lift anything that is heavier than 10 lb (4.5 kg) until your health care provider says that this is safe.  Take over-the-counter and prescription medicines only as told by your health care provider.  Keep all follow-up visits as told by your health care provider. This is important. Contact a health care provider if:  Your hernia gets larger.  Your hernia becomes painful. Get help right away if:  You develop sudden, severe pain near the area of your hernia.  You have pain as well as nausea or vomiting.  You have pain and the skin over your hernia changes color.  You develop a fever. This information is not intended to replace advice given to you by your health care provider. Make sure you discuss any questions you have with your health care provider. Document Released: 09/22/2015 Document  Revised: 06/04/2017 Document Reviewed: 10/21/2016 Elsevier Patient Education  2020 [place].  Inguinal Hernia, Adult An inguinal hernia is when fat or your intestines push through a weak spot in a muscle where your leg meets your lower belly (groin). This causes a rounded lump (bulge). This kind of hernia could also be:  In your scrotum, if you are male.  In folds of skin around your vagina, if you are male. There are three types of inguinal hernias. These include:  Hernias that can be pushed back into the belly (are reducible). This type rarely causes pain.  Hernias that cannot be pushed back into the belly (are incarcerated).  Hernias that cannot be pushed back into the belly and lose their blood supply (are strangulated). This type needs emergency surgery. If you do not have symptoms, you may not need treatment. If you have symptoms or a large hernia, you may need surgery. Follow these instructions at home: Lifestyle  Do these things if told by your doctor so you do not have trouble pooping (constipation): ? Drink enough fluid to keep your pee (urine) pale yellow. ? Eat foods that have a lot of fiber. These include fresh fruits and vegetables, whole grains, and beans. ? Limit foods that are high in fat and processed sugars. These include foods that are fried or sweet. ? Take medicine for trouble pooping.  Avoid lifting heavy objects.  Avoid standing for long amounts of time.  Do not use any products that contain nicotine or tobacco. These include cigarettes and e-cigarettes. If you need help quitting, ask your doctor.  Stay at a healthy weight. General instructions  You may try to push your hernia in by very gently pressing on it when you are lying down. Do not try to force the bulge back in if it will not push in easily.  Watch your hernia for any changes in shape, size, or color. Tell your doctor if you see any changes.  Take over-the-counter and prescription  medicines only as told by your doctor.  Keep all follow-up visits as told by your doctor. This is important. Contact a doctor if:  You have a fever.  You have new symptoms.  Your symptoms get worse. Get help right away if:  The area where your leg meets your lower belly has: ? Pain that gets worse suddenly. ? A bulge that gets bigger suddenly, and it does not get smaller after that. ? A bulge that turns red or purple. ? A bulge that is painful when you touch it.  You are a man, and your scrotum: ? Suddenly feels painful. ? Suddenly changes in size.  You cannot push the hernia in by very gently pressing on it when you are lying down. Do not try to force the bulge back in if it will not push in easily.  You feel sick to your stomach (nauseous), and that feeling does not go away.  You throw up (vomit), and that keeps happening.  You have a fast heartbeat.  You cannot poop (have a bowel movement) or pass gas.  These symptoms may be an emergency. Do not wait to see if the symptoms will go away. Get medical help right away. Call your local emergency services (911 in the [place] Summary  An inguinal hernia is when fat or your intestines push through a weak spot in a muscle where your leg meets your lower belly (groin). This causes a rounded lump (bulge).  If you do not have symptoms, you may not need treatment. If you have symptoms or a large hernia, you may need surgery.  Avoid lifting heavy objects. Also avoid standing for long amounts of time.  Do not try to force the bulge back in if it will not push in easily. This information is not intended to replace advice given to you by your health care provider. Make sure you discuss any questions you have with your health care provider. Document Released: 05/23/2006 Document Revised: 05/24/2017 Document Reviewed: 01/22/2017 Elsevier Patient Education  [place].    Cardinal Health Content in Foods  See the following list for the  dietary fiber content of some common foods. High-fiber foods High-fiber foods contain 4 grams or more (4g or more) of fiber per serving. They include:  Artichoke (fresh) - 1 medium has 10.3g of fiber.  Baked beans, plain or vegetarian (canned) -  cup has 5.2g of fiber.  Blackberries or raspberries (fresh) -  cup has 4g of fiber.  Bran cereal -  cup has 8.6g of fiber.  Bulgur (cooked) -  cup has 4g of fiber.  Kidney beans (canned) -  cup has 6.8g of fiber.  Lentils (cooked) -  cup has 7.8g of fiber.  Pear (fresh) - 1 medium has 5.1g of fiber.  Peas (frozen) -  cup has 4.4g of fiber.  Pinto beans (canned) -  cup has 5.5g of fiber.  Pinto beans (dried and cooked) -  cup has 7.7g of fiber.  Potato with skin (baked) - 1 medium has 4.4g of fiber.  Quinoa (cooked) -  cup has 5g of fiber.  Soybeans (canned, frozen, or fresh) -  cup has 5.1g of fiber. Moderate-fiber foods Moderate-fiber foods contain 1-4 grams (1-4g) of fiber per serving. They include:  Almonds - 1 oz. has 3.5g of fiber.  Apple with skin - 1 medium has 3.3g of fiber.  Applesauce, sweetened -  cup has 1.5g of fiber.  Bagel, plain - one 4-inch (10-cm) bagel has 2g of fiber.  Banana - 1 medium has 3.1g of fiber.  Broccoli (cooked) -  cup has 2.5g of fiber.  Carrots (cooked) -  cup has 2.3g of fiber.  Corn (canned or frozen) -  cup has 2.1g of fiber.  Corn tortilla - one 6-inch (15-cm) tortilla has 1.5g of fiber.  Green beans (canned) -  cup has 2g of fiber.  Instant oatmeal -  cup has about 2g of fiber.  Long-grain brown rice (cooked) - 1 cup has 3.5g of fiber.  Macaroni, enriched (cooked) - 1 cup has 2.5g of fiber.  Melon - 1 cup has 1.4g of fiber.  Multigrain cereal -  cup has about 2-4g of fiber.  Orange - 1 small has 3.1g of fiber.  Potatoes, mashed -  cup has 1.6g of fiber.  Raisins - 1/4 cup has 1.6g of fiber.  Squash -  cup has 2.9g of fiber.  Sunflower seeds -   cup has 1.1g of fiber.  Tomato - 1 medium has 1.5g of fiber.  Vegetable or soy patty - 1 has 3.4g of  fiber.  Whole-wheat bread - 1 slice has 2g of fiber.  Whole-wheat spaghetti -  cup has 3.2g of fiber. Low-fiber foods Low-fiber foods contain less than 1 gram (less than 1g) of fiber per serving. They include:  Egg - 1 large.  Flour tortilla - one 6-inch (15-cm) tortilla.  Fruit juice -  cup.  Lettuce - 1 cup.  Meat, poultry, or fish - 1 oz.  Milk - 1 cup.  Spinach (raw) - 1 cup.  White bread - 1 slice.  White rice -  cup.  Yogurt -  cup. Actual amounts of fiber in foods may be different depending on processing. Talk with your dietitian about how much fiber you need in your diet. This information is not intended to replace advice given to you by your health care provider. Make sure you discuss any questions you have with your health care provider. Document Released: 09/08/2006 Document Revised: 12/14/2015 Document Reviewed: 06/15/2015 Elsevier Patient Education  2020 [employer].

## 2018-11-19 NOTE — Progress Notes (Unsigned)
Patient ID: Connor Hall, male   DOB: 1940/05/19, 78 y.o.   MRN: [id number]  Chief Complaint  Patient presents with  . New Patient (Initial Visit)    Bilateral Inguinal Hernias    HPI Connor Hall is a 78 y.o. male.  Here today for Bilateral inguinal hernias. Present since fall 2019. Patient states he does not feel a bulge, but complains of pain and discomfort. No urinary complaints. HPI  Past Medical History:  Diagnosis Date  . Anemia   . Anxiety   . Arthritis   . Asthma    WELL CONTROLLED  . Cancer (HCC)    MELANOMA (NOSE, FACE AND HANDS), BASAL CELL  . Cervicalgia   . Depression   . Diabetes mellitus without complication ([place])   . Diverticulitis   . Dyspnea   . GERD (gastroesophageal reflux disease)   . Headache   . Hernia of abdominal cavity   . History of hiatal hernia   . Hyperlipidemia   . Hypertension   . Sleep apnea    DOES NOT USE CPAP    Past Surgical History:  Procedure Laterality Date  . APPENDECTOMY    . [place]   X3  . CATARACT EXTRACTION W/PHACO Right 04/03/2016   Procedure: CATARACT EXTRACTION PHACO AND INTRAOCULAR LENS PLACEMENT (IOC);  Surgeon: Estill Cotta, MD;  Location: [facility] ORS;  Service: Ophthalmology;  Laterality: Right;  [place] 1.37 AP% 25.8 CDE 44.67 Fluid pack lot # [id number]  . CATARACT EXTRACTION W/PHACO Left 05/29/2016   Procedure: CATARACT EXTRACTION PHACO AND INTRAOCULAR LENS PLACEMENT (IOC);  Surgeon: Estill Cotta, MD;  Location: [facility] ORS;  Service: Ophthalmology;  Laterality: Left;  [place] 01:16 AP% 23.3 CDE 31.18 Fluid pack lot # [id number]  . COLONOSCOPY    . COLONOSCOPY WITH PROPOFOL N/A 01/15/2017   Procedure: COLONOSCOPY WITH PROPOFOL;  Surgeon: Connor Silvas, MD;  Location: [facility] ENDOSCOPY;  Service: Endoscopy;  Laterality: N/A;  . ESOPHAGOGASTRODUODENOSCOPY    . ESOPHAGOGASTRODUODENOSCOPY (EGD) WITH PROPOFOL N/A 01/15/2017   Procedure: ESOPHAGOGASTRODUODENOSCOPY (EGD) WITH PROPOFOL;  Surgeon:  Connor Silvas, MD;  Location[facility] ENDOSCOPY;  Service: Endoscopy;  Laterality: N/A;  . KNEE ARTHROSCOPY Left 08/30/2015   Procedure: ARTHROSCOPY KNEE LEFT, partial medial menisectomy, chondral debridement;  Surgeon: Leanor Kail, MD;  Location: [facility] ORS;  Service: Orthopedics;  Laterality: Left;  . LEFT HEART CATH AND CORONARY ANGIOGRAPHY Left 11/24/2017   Procedure: LEFT HEART CATH AND CORONARY ANGIOGRAPHY;  Surgeon: Corey Skains, MD;  Location: [facility];  Service: Cardiovascular;  Laterality: Left;  . MOHS SURGERY    . POLYPECTOMY    . ROTATOR CUFF REPAIR Right   . TRIGGER FINGER RELEASE      Family History  Problem Relation Age of Onset  . Pancreatic cancer Mother   . Heart attack Father     Social History Social History   Tobacco Use  . Smoking status: Former Smoker    Packs/day: 0.50    Years: 13.00    Pack years: 6.50    Types: Cigarettes    Quit date: 08/28/1975    Years since quitting: 43.2  . Smokeless tobacco: Never Used  Substance Use Topics  . Alcohol use: No  . Drug use: No    Allergies  Allergen Reactions  . Ace Inhibitors   . Penicillins Other (See Comments)    CHILDHOOD ALLERGY  Has patient had a PCN reaction causing immediate rash, facial/tongue/throat swelling, SOB or lightheadedness with hypotension:  Unknown Has patient had a PCN reaction causing severe rash involving mucus membranes or skin necrosis: Unknown Has patient had a PCN reaction that required hospitalization: No Has patient had a PCN reaction occurring within the last 10 years: No If all of the above answers are "NO", then may proceed with Cephalosporin use.    . Aspirin Rash  . Other Palpitations    MSG    Current Outpatient Medications  Medication Sig Dispense Refill  . amLODipine (NORVASC) 5 MG tablet Take 5 mg by mouth every morning.    . cyanocobalamin 1000 MCG tablet Take 1,000 mcg by mouth daily.    Marland Kitchen glipiZIDE (GLUCOTROL XL) 10 MG 24 hr tablet Take 10  mg by mouth daily with breakfast.    . hydrochlorothiazide (HYDRODIURIL) 25 MG tablet Take 25 mg by mouth daily.    . meloxicam (MOBIC) 7.5 MG tablet Take 7.5 mg by mouth 2 (two) times daily.    . metFORMIN (GLUCOPHAGE) 1000 MG tablet Take 1,000 mg by mouth 2 (two) times daily with a meal.    . mometasone (NASONEX) 50 MCG/ACT nasal spray Place 2 sprays into the nose daily.    . Multiple Vitamins-Minerals (CENTRUM SILVER ULTRA MENS) TABS Take 1 tablet by mouth daily.    Marland Kitchen omeprazole (PRILOSEC) 20 MG capsule Take 20 mg by mouth every morning.    . pravastatin (PRAVACHOL) 80 MG tablet Take 80 mg by mouth at bedtime.    . pyridOXINE (B-6) 50 MG tablet Take 50 mg by mouth daily.    . traMADol (ULTRAM) 50 MG tablet Take 50 mg by mouth every 6 (six) hours as needed.     No current facility-administered medications for this visit.     Review of Systems Review of Systems  Constitutional: Negative.   Cardiovascular: Negative.     There were no vitals taken for this visit.  Physical Exam Physical Exam Skin:    General: Skin is warm and dry.  Neurological:     Mental Status: He is alert.     Data Reviewed ***  Assessment ***  Plan Call our office if you decide to have the hernia's repaired.   HPI, Physical Exam, Assessment and Plan have been scribed under the direction and in the presence of Robert Bellow, [place], [place] 11/19/2018, 3:22 PM

## 2018-12-17 ENCOUNTER — Encounter: Payer: Self-pay | Admitting: *Deleted

## 2019-04-21 ENCOUNTER — Other Ambulatory Visit: Payer: Self-pay | Admitting: General Surgery

## 2019-04-21 DIAGNOSIS — K4090 Unilateral inguinal hernia, without obstruction or gangrene, not specified as recurrent: Secondary | ICD-10-CM

## 2019-05-07 DIAGNOSIS — J329 Chronic sinusitis, unspecified: Secondary | ICD-10-CM

## 2019-05-07 HISTORY — DX: Chronic sinusitis, unspecified: J32.9

## 2019-05-13 ENCOUNTER — Other Ambulatory Visit: Payer: Self-pay

## 2019-05-13 ENCOUNTER — Encounter
Admission: RE | Admit: 2019-05-13 | Discharge: 2019-05-13 | Disposition: A | Discharge status: Home / Self Care | Payer: Medicare HMO | Source: Ambulatory Visit | Attending: General Surgery | Admitting: General Surgery

## 2019-05-13 DIAGNOSIS — I10 Essential (primary) hypertension: Secondary | ICD-10-CM | POA: Insufficient documentation

## 2019-05-13 DIAGNOSIS — Z01818 Encounter for other preprocedural examination: Secondary | ICD-10-CM | POA: Insufficient documentation

## 2019-05-13 DIAGNOSIS — E119 Type 2 diabetes mellitus without complications: Secondary | ICD-10-CM | POA: Insufficient documentation

## 2019-05-13 DIAGNOSIS — K4090 Unilateral inguinal hernia, without obstruction or gangrene, not specified as recurrent: Secondary | ICD-10-CM | POA: Insufficient documentation

## 2019-05-13 NOTE — Pre-Procedure Instructions (Signed)
Echo stress with physician supervision7/17/2019 [place] Component Name Value Ref Range  LV Ejection Fraction (%) 55   Aortic Valve Regurgitation Grade trivial   Aortic Valve Stenosis Grade none   Mitral Valve Regurgitation Grade trivial   Mitral Valve Stenosis Grade none   Tricuspid Valve Regurgitation Grade mild   Tricuspid Valve Regurgitation Max Velocity (m/s) 2.1 m/sec  Result Narrative                                 [place], [place]                  [id number]      [place] #: [id number]      [place], [place], [place] [address]   Date: 11/19/2017 12: 76 PM                                Adult  Male  Age: 79 yrs      ECHOCARDIOGRAM REPORT               Outpatient                                KC^^KCWI    STUDY:Stress Echo       TAPE:          MD1: JOHNSTON, JOHN DAVID    ECHO:Yes  DOPPLER:Yes    FILE:0000-000-000    BP: 135/70 mmHg    COLOR:Yes  CONTRAST:No   MACHINE:Philips  RV BIOPSY:No     3D:No SOUND QLTY:Moderate      Height: 72 in   MEDIUM:None                       Weight: 225 lb                                BSA: 2.2 m2 _________________________________________________________________________________________        HISTORY: Chest pain         REASON: Assess, LV function       Indication: Dyspnea on exertion [R06.09 (ICD-10-CM)] _________________________________________________________________________________________ STRESS ECHOCARDIOGRAPHY        Protocol: Treadmill (Accelerated Bruce)         Drugs: None       Target HR: 122 bpm      Maximum Predicted HR: 144  bpm +----------------------------------------------------------------------------+ :Stage      Duration           HR     BP         : :  RESTING   :              :71    :135/70       : :---------------+----------------------------+----------+---------+     : : EXERCISE   :4:47            :136    :/          : :---------------+----------------------------+----------+---------+     : : RECOVERY   :6:32            :70    :  135/70       : :---------------+----------------------------+----------+---------+     : +----------------------------------------------------------------------------+    Stress Duration: 4: 47 mm: ss    Max Stress H.R.: 136 bpm       Target HR Achieved: Yes Maximum workload of 9.70 METS was achieved during exercise _________________________________________________________________________________________ WALL SEGMENT CHANGES             Rest        Stress Anterior Septum Basal: Normal       Hyperkinetic          Mid: Normal       Hyperkinetic         Apical: Normal       Hyperkinetic  Anterior Wall Basal: Normal       Hyperkinetic          Mid: Normal       Hyperkinetic         Apical: Normal       Hyperkinetic   Lateral Wall Basal: Normal       HYPOKINETIC          Mid: Normal       HYPOKINETIC         Apical: Normal       Hyperkinetic  Posterior Wall Basal: Normal       Hyperkinetic          Mid: Normal       Hyperkinetic  Inferior Wall Basal: Normal       HYPOKINETIC          Mid: Normal       HYPOKINETIC         Apical: Normal       Hyperkinetic Inferior Septum Basal: Normal       Hyperkinetic          Mid: Normal        Hyperkinetic       Resting EF: >55% (Est.)         Stress EF: >55% (Est.)  _________________________________________________________________________________________ ADDITIONAL FINDINGS _________________________________________________________________________________________ STRESS ECG RESULTS      ECG Results: POSITIVE _________________________________________________________________________________________ ECHOCARDIOGRAPHIC DESCRIPTIONS LEFT VENTRICLE          Size: Normal      Contraction: Normal       LV Masses: No Masses          LVH: None      Dias.FxClass: Normal RIGHT VENTRICLE          Size: Normal            Free Wall: Normal      Contraction: Normal            RV Masses: No mass PERICARDIUM         Fluid: No effusion _________________________________________________________________________________________  DOPPLER ECHO and OTHER SPECIAL PROCEDURES         Aortic: TRIVIAL AR         No AS         Mitral: TRIVIAL MR         No MS             MV Inflow E Vel = nm*   MV Annulus E'Vel = nm*             E/E'Ratio = nm*       Tricuspid: MILD TR          No TS             210.4 cm/sec peak TR  vel       Pulmonary: TRIVIAL PR         No PS _________________________________________________________________________________________ ECHOCARDIOGRAPHIC MEASUREMENTS 2D DIMENSIONS AORTA         Values  Normal Range  MAIN PA     Values  Normal Range        Annulus: nm*   [2.3-2.9]       PA Main: nm*    [1.5-2.1]       Aorta Sin: nm*   [3.1-3.7]  RIGHT VENTRICLE      ST Junction: nm*   [2.6-3.2]       RV Base: nm*    [<4.2]       Asc.Aorta: nm*   [2.6-3.4]        RV Mid: nm*    [<3.5] LEFT VENTRICLE                    RV Length: nm*    [<8.6]         LVIDd: nm*   [4.2-5.9]  INFERIOR VENA CAVA         LVIDs: nm*              Max. IVC: nm*    [<=2.1]           FS: nm*   [>25]         Min. IVC: nm*          SWT: nm*   [0.6-1.0]  ------------------          PWT: nm*   [0.6-1.0]  nm* - not measured LEFT ATRIUM        LA Diam: nm*   [3.0-4.0]      LA A4C Area: nm*   [<20]       LA Volume: nm*   [18-58] _________________________________________________________________________________________ INTERPRETATION ABNORMAL STRESS ECHOCARDIOGRAM NORMAL RIGHT VENTRICULAR SYSTOLIC FUNCTION MILD VALVULAR REGURGITATION (See above) NO VALVULAR STENOSIS NOTED Inferolateral hypokinesis post exercise _________________________________________________________________________________________ Electronically signed by   Rusty Aus, MD on 11/19/2017 06: 41 PM      Performed By: Scherrie November, RCS   Ordering Physician: Harrel Lemon _________________________________________________________________________________________  Other Result Information  Interface, Text Results In - 11/19/2017  6:41 PM EDT                                                              [facility]                                    [id number]           [facility]                           Acct #: [id number]           [address] [place],  [place] [address]      Date: 11/19/2017 12: 13 PM  Adult   Male   Age: 89 yrs           ECHOCARDIOGRAM REPORT                              Outpatient                                                              KC^^KCWI      STUDY:Stress Echo              TAPE:                    MD1: JOHNSTON, JOHN DAVID       ECHO:Yes    DOPPLER:Yes       FILE:0000-000-000        BP: 135/70  mmHg      COLOR:Yes   CONTRAST:No     MACHINE:Philips  RV BIOPSY:No          3D:No  SOUND QLTY:Moderate            Height: 72 in     MEDIUM:None                                              Weight: 225 lb                                                              BSA: 2.2 m2 _________________________________________________________________________________________               HISTORY: Chest pain                REASON: Assess, LV function            Indication: Dyspnea on exertion [R06.09 (ICD-10-CM)] _________________________________________________________________________________________ STRESS ECHOCARDIOGRAPHY              Protocol: Treadmill (Accelerated Bruce)                 Drugs: None             Target HR: 122 bpm           Maximum Predicted HR: 144 bpm +----------------------------------------------------------------------------+ :Stage           Duration                     HR         BP                  : :   RESTING     :                            :71        :135/70              : :---------------+----------------------------+----------+---------+          : :  EXERCISE     :4:47                        :136       :/                   : :---------------+----------------------------+----------+---------+          : :  RECOVERY     :6:32                        :70        :135/70              : :---------------+----------------------------+----------+---------+          : +----------------------------------------------------------------------------+       Stress Duration: 4: 47 mm: ss       Max Stress H.R.: 136 bpm             Target HR Achieved: Yes Maximum workload of 9.70 METS was achieved during exercise _________________________________________________________________________________________ WALL SEGMENT CHANGES                        Rest                Stress Anterior Septum Basal: Normal              Hyperkinetic                   Mid: Normal               Hyperkinetic                Apical: Normal              Hyperkinetic   Anterior Wall Basal: Normal              Hyperkinetic                   Mid: Normal              Hyperkinetic                Apical: Normal              Hyperkinetic    Lateral Wall Basal: Normal              HYPOKINETIC                   Mid: Normal              HYPOKINETIC                Apical: Normal              Hyperkinetic  Posterior Wall Basal: Normal              Hyperkinetic                   Mid: Normal              Hyperkinetic   Inferior Wall Basal: Normal              HYPOKINETIC                   Mid: Normal              HYPOKINETIC  Apical: Normal              Hyperkinetic Inferior Septum Basal: Normal              Hyperkinetic                   Mid: Normal              Hyperkinetic            Resting EF: >55% (Est.)                  Stress EF: >55% (Est.)  _________________________________________________________________________________________ ADDITIONAL FINDINGS _________________________________________________________________________________________ STRESS ECG RESULTS           ECG Results: POSITIVE _________________________________________________________________________________________ ECHOCARDIOGRAPHIC DESCRIPTIONS LEFT VENTRICLE                  Size: Normal           Contraction: Normal             LV Masses: No Masses                   LVH: None          Dias.FxClass: Normal RIGHT VENTRICLE                  Size: Normal                       Free Wall: Normal           Contraction: Normal                       RV Masses: No mass PERICARDIUM                 Fluid: No effusion _________________________________________________________________________________________  DOPPLER ECHO and OTHER SPECIAL PROCEDURES                Aortic: TRIVIAL AR                 No AS                Mitral: TRIVIAL MR                 No MS                        MV Inflow E Vel = nm*      MV  Annulus E'Vel = nm*                        E/E'Ratio = nm*             Tricuspid: MILD TR                    No TS                        210.4 cm/sec peak TR vel             Pulmonary: TRIVIAL PR                 No PS _________________________________________________________________________________________ ECHOCARDIOGRAPHIC MEASUREMENTS 2D DIMENSIONS AORTA                  Values   Normal Range   MAIN PA         Values    Normal  Range               Annulus: nm*     [2.3-2.9]              PA Main: nm*       [1.5-2.1]             Aorta Sin: nm*     [3.1-3.7]    RIGHT VENTRICLE           ST Junction: nm*     [2.6-3.2]              RV Base: nm*       [<4.2]             Asc.Aorta: nm*     [2.6-3.4]               RV Mid: nm*       [<3.5] LEFT VENTRICLE                                      RV Length: nm*       [<8.6]                 LVIDd: nm*     [4.2-5.9]    INFERIOR VENA CAVA                 LVIDs: nm*                           Max. IVC: nm*       [<=2.1]                    FS: nm*     [>25]                 Min. IVC: nm*                   SWT: nm*     [0.6-1.0]    ------------------                   PWT: nm*     [0.6-1.0]    nm* - not measured LEFT ATRIUM               LA Diam: nm*     [3.0-4.0]           LA A4C Area: nm*     [<20]             LA Volume: nm*     [18-58] _________________________________________________________________________________________ INTERPRETATION ABNORMAL STRESS ECHOCARDIOGRAM NORMAL RIGHT VENTRICULAR SYSTOLIC FUNCTION MILD VALVULAR REGURGITATION (See above) NO VALVULAR STENOSIS NOTED Inferolateral hypokinesis post exercise _________________________________________________________________________________________ Electronically signed by      Rusty Aus, MD on 11/19/2017 06: 41 PM          Performed By: Scherrie November, RCS    Ordering Physician: Harrel Lemon _________________________________________________________________________________________   Status Results Details   Encounter Summary

## 2019-05-13 NOTE — Pre-Procedure Instructions (Signed)
Progress Notes - documented in this encounter Connor Hall, [place] - 06/23/2018 11:00 AM EST Formatting of this note might be different from the original. Established Patient Visit   Chief Complaint: Chief Complaint  Patient presents with  . Coronary Artery Disease  . Hypertension  . Hyperlipidemia  Date of Service: 06/23/2018 Date of Birth: [birth year] PCP: Velna Ochs, MD   History of Present Illness: Mr. Connor Hall is a 79 y.o. male with a history of history of coronary artery disease with moderate diffuse 3 vessel disease seen on cath from 11/2017, HTN, and HLD, who presents for follow up feeling well without any chest pain or shortness of breath. In prior visits he has reported dyspnea on exertion. He denies any dyspnea recently, however, he hasn't been active in several months due to poor health and recent passing of his mother-in-law. He is still somewhat concerned about his breathing and wonders if it would be reasonable to do more testing. He plans on rejoining the [employer] to attend some silver sneaker exercise classes this month.He denies any syncope, palpitations, or leg swelling.   Blood pressure has been well controlled on HCTZ and amlodipine. His dyslipidemia is managed with pravastatin 80 mg. Last lipid panel from 09/2016 with total cholesterol of 152, HDL 54, and LDL 80.   Past Medical and Surgical History  Past Medical History Past Medical History:  Diagnosis Date  . Abdominal hernia  . Allergic state  . Anemia 10/11/2016  . Anxiety  . Arthritis  . Asthma without status asthmaticus  . Cancer (CMS-HCC) Skin Cancer  . Cataract cortical, senile Removed 11/17 & 1/18  . Cervicalgia  . Colon polyp  . Diabetes mellitus type 2, uncomplicated (CMS-HCC)  . Diabetes mellitus type 2, uncomplicated (CMS-HCC) 07/09/5730  . Diverticulitis  . Diverticulosis  . GERD (gastroesophageal reflux disease)  . Headache  . Hyperlipidemia  . Hypertension  . Hypogonadism in male  .  Microalbuminuria  . Obesity  . Sleep apnea  . Trigger middle finger of left hand 03/23/2014  . Trigger ring finger of right hand 03/23/2014   Past Surgical History He has a past surgical history that includes Appendectomy; Trigger release (Right); Back surgery; Colonoscopy (06/19/2011, 02/20/2006); egd (06/19/2011); Arthrocopic release of the long head of the biceps tendon, followed by arthroscopic subacromial decompression and mini incision rotator cuff repair (Right, 07/01/2014); Knee arthroscopy (Left, 08/30/2015); Sigmoidoscopy; Colonoscopy (01/15/2017); Upper gastrointestinal endoscopy; egd (01/15/2017); Cataract extraction; and Tonsillectomy.   Medications and Allergies  Current Medications  Current Outpatient Medications  Medication Sig Dispense Refill  . amLODIPine (NORVASC) 5 MG tablet TAKE 1 TABLET ONCE DAILY 90 tablet 0  . ascorbic acid, vitamin C, (VITAMIN C) 1000 MG tablet Take 1,000 mg by mouth once daily  . blood glucose diagnostic (ONETOUCH ULTRA TEST) test strip Use once daily Use as instructed. 100 each 3  . cyanocobalamin (VITAMIN B12) 1000 MCG tablet Take 1,000 mcg by mouth once daily  . ferrous sulfate 325 (65 FE) MG tablet Take 325 mg by mouth daily with breakfast  . glipiZIDE (GLUCOTROL XL) 10 MG XL tablet TAKE 1 TABLET EVERY MORNING 90 tablet 0  . hydroCHLOROthiazide (HYDRODIURIL) 25 MG tablet Take 1 tablet (25 mg total) by mouth once daily 90 tablet 3  . meloxicam (MOBIC) 7.5 MG tablet TAKE 1 TABLET TWICE A DAY 180 tablet 0  . metFORMIN (GLUCOPHAGE) 1000 MG tablet TAKE 1 TABLET TWICE A DAY WITH MEALS 180 tablet 0  . mometasone (NASONEX) 50 mcg/actuation nasal  spray Place 2 sprays into both nostrils once daily 51 g 3  . multivit-min-FA-lycopen-lutein (CENTRUM SILVER ULTRA MEN'S) 300-600-300 mcg Tab Take 1 tablet by mouth once daily  . omeprazole (PRILOSEC) 20 MG DR capsule TAKE 1 CAPSULE ONCE DAILY 90 capsule 0  . ONETOUCH ULTRAMINI kit Use as instructed. 1 each 0  .  pravastatin (PRAVACHOL) 80 MG tablet TAKE 1 TABLET ONCE DAILY 90 tablet 0  . pyridoxine, vitamin B6, (VITAMIN B-6) 50 MG tablet Take 50 mg by mouth once daily  . traMADol (ULTRAM) 50 mg tablet Take 1 tablet (50 mg total) by mouth every 6 (six) hours as needed 60 tablet 3   No current facility-administered medications for this visit.   Allergies: Ace inhibitors; Arb-angiotensin receptor antagonist; Aspirin; Penicillins; and Other  Social and Family History  Social History reports that he quit smoking about 45 years ago. He has a 15.00 pack-year smoking history. He has never used smokeless tobacco. He reports that he does not drink alcohol or use drugs.  Family History Family History  Problem Relation Age of Onset  . Pancreatic cancer Mother  . Myocardial Infarction (Heart attack) Father 68  . Colon polyps Father  Deceased 18  . Blindness Sister  . Diabetes Brother  . Diabetes Maternal Grandmother  . No Known Problems Maternal Grandfather  . No Known Problems Paternal Grandmother  . No Known Problems Paternal Grandfather  . Diabetes Daughter  . Diabetes Brother   Review of Systems   Review of Systems:  The patient denies chest pain, shortness of breath, orthopnea, paroxysmal nocturnal dyspnea, pedal edema, palpitations, heart racing, dizziness, lightheadedness, presyncope, syncope, leg pain, leg cramping.  Review of 12 Systems is negative except as described above.  Physical Examination   Vitals:BP 124/78  Pulse 73  Ht 182.9 cm (6')  Wt 98 kg (216 lb 0.8 oz)  SpO2 95%  BMI 29.30 kg/m  Ht:182.9 cm (6') Wt:98 kg (216 lb 0.8 oz) HBZ:JIRC surface area is 2.23 meters squared. Body mass index is 29.3 kg/m.  Constitutional: Alert, well developed, in no acute distress HEENT: Pupils equally reactive to light and accomodation  Neck: Supple. Carotid pulse 2+ bilaterally Lungs: Clear to auscultation bilaterally; no wheezes, rales, rhonchi Heart: Regular rate and rhythm. No  gallops, murmurs or rub Abdomen: Soft, non-tender Extremities: No lower extremity edema. No cyanosis.  Peripheral Pulses: 2+ in upper extremities, 2+ in lower extremities   Assessment   79 y.o. male with  1. Coronary artery disease involving native coronary artery of native heart without angina pectoris  2. Essential hypertension  3. Pure hypercholesterolemia  4. Type 2 diabetes mellitus without complication, without long-term current use of insulin (CMS-HCC)   Plan   1. CAD seen on cath from 11/2017 - without stents or other interventions:  Currently on moderate intensity statin therapy with pravastatin 80 mg daily for risk reduction. Not on aspirin due to reported allergy with hives. He is also unable to tolerate ACE or ARB due to hx of anaphylaxis. Continue statin therapy.   2. Dyspnea:  Hasn't been active in the last few months. Has previously reported dyspnea on exertion. Given patient's ongoing concern, will obtain an ETT to evaluate exercise tolerance and assess for reproduction of symptoms.   3. HTN Currently on HCTZ, and amlodipine for blood pressure control. Blood pressure well controlled. Continue current regimen.   3. HLD: Moderate intensity statin therapy with pravastatin 80 mg. Last lipid panel from 09/2016 with total cholesterol of 152, HDL  54, and LDL 80. Reports PCP follow up and lab draw next month. Consider switching to high intensity statin therapy at next visit especially if LDL remains >70.   4. Diabetes:  Currently on metformin, and glipizide for diabetes management. Last A1c 7.1. States he was on two different course of oral steroids that elevated his blood sugars recently. Goal A1c is <7. Continued to encourage healthy lifestyle with diet modification and regular physical activity.   No orders of the defined types were placed in this encounter.  Return in about 6 months (around 12/22/2018).   The patient's history and exam findings were discussed with Dr.  Nehemiah Massed. The plan was made in conjunction with Dr. Nehemiah Massed.   MICHELLE Jimmey Ralph, PA-C    Electronically signed by Connor Bodo, PA at 06/23/2018 12:07 PM EST

## 2019-05-13 NOTE — Patient Instructions (Signed)
Your procedure is scheduled on: 05-19-19 [facility] Report to Same Day Surgery 2nd floor medical mall [facility] Entrance-take elevator on left to 2nd floor.  Check in with surgery information desk.) To find out your arrival time please call [phone] between 1PM - 3PM on 05-18-19 TUESDAY  Remember: Instructions that are not followed completely may result in serious medical risk, up to and including death, or upon the discretion of your surgeon and anesthesiologist your surgery may need to be rescheduled.    _x___ 1. Do not eat food after midnight the night before your procedure. NO GUM OR CANDY AFTER MIDNIGHT. You may drink WATER up to 2 hours before you are scheduled to arrive at the hospital for your procedure.  Do not drink WATER within 2 hours of your scheduled arrival to the hospital.  Type 1 and type 2 diabetics should only drink water.   ____Ensure clear carbohydrate drink on the way to the hospital for bariatric patients  ____Ensure clear carbohydrate drink 3 hours before surgery.    __x__ 2. No Alcohol for 24 hours before or after surgery.   __x__3. No Smoking or e-cigarettes for 24 prior to surgery.  Do not use any chewable tobacco products for at least 6 hour prior to surgery   ____  4. Bring all medications with you on the day of surgery if instructed.    __x__ 5. Notify your doctor if there is any change in your medical condition     (cold, fever, infections).    x___6. On the morning of surgery brush your teeth with toothpaste and water.  You may rinse your mouth with mouth wash if you wish.  Do not swallow any toothpaste or mouthwash.   Do not wear jewelry, make-up, hairpins, clips or nail polish.  Do not wear lotions, powders, or perfumes.   Do not shave 48 hours prior to surgery. Men may shave face and neck.  Do not bring valuables to the hospital.    [facility] is not responsible for any belongings or valuables.               Contacts, dentures or bridgework  may not be worn into surgery.  Leave your suitcase in the car. After surgery it may be brought to your room.  For patients admitted to the hospital, discharge time is determined by your treatment team.  _  Patients discharged the day of surgery will not be allowed to drive home.  You will need someone to drive you home and stay with you the night of your procedure.    Please read over the following fact sheets that you were given:   [facility] Preparing for Surgery    _x___ TAKE THE FOLLOWING MEDICATION THE MORNING OF SURGERY WITH A SMALL SIP OF WATER. These include:  1. NORVASC (AMLODIPINE)  2. PRILOSEC (OMEPRAZOLE)  3. TAKE AN EXTRA PRILOSEC THE NIGHT BEFORE YOUR SURGERY  4.  5.  6.  ____Fleets enema or Magnesium Citrate as directed.   _x___ Use CHG Soap or sage wipes as directed on instruction sheet   ____ Use inhalers on the day of surgery and bring to hospital day of surgery  _X___ Stop Metformin 2 days prior to surgery-LAST DOSE ON Sunday 05-16-19   ____ Take 1/2 of usual insulin dose the night before surgery and none on the morning surgery.   ____ Follow recommendations from Cardiologist, Pulmonologist or PCP regarding stopping Aspirin, Coumadin, Plavix ,Eliquis, Effient, or Pradaxa, and Pletal.  X____Stop Anti-inflammatories such as Advil, Aleve, Ibuprofen, Motrin, Naproxen, MOBIC (MELOXICAM) Naprosyn, Goodies powders or aspirin products NOW-OK to take Tylenol OR TRAMADOL IF NEEDED   ____ Stop supplements until after surgery.     ____ Bring C-Pap to the hospital.

## 2019-05-13 NOTE — Pre-Procedure Instructions (Signed)
[place] Order# [id number] Reading physician: Corey Skains, MD Ordering physician: Corey Skains, MD Study date: 11/24/17  MyChart Results Release  MyChart Status: Active Results Release  Physicians  Panel Physicians Referring Physician Case Authorizing Physician  Corey Skains, MD (Primary)    Procedures  LEFT HEART CATH AND CORONARY ANGIOGRAPHY  Conclusion    Mid LM lesion is 40% stenosed.  Mid LM to Ost LAD lesion is 40% stenosed with 40% stenosed side branch in Ost Cx.  Prox LAD lesion is 35% stenosed.  Ost Cx to Prox Cx lesion is 25% stenosed.   Assessment The patient has had progressive canadian class 3 anginal symptoms with a high probability stress test with inferior sichemia with risk factors including diabetes, high blood pressure and high cholesterol.  normal left ventricular function with ejection fraction of 60%  mild 2 vessel coronary artery disease   [place] to Lad with 40% stenosis Plan  Continue medical management of CAD risk factors, Additional medications for management of angina and No further cardiac intervention at this time   Indications  Abnormal cardiac function test [R94.30 (ICD-10-CM)]  Procedural Details  Technical Details Procedural details: The right groin was prepped, draped, and anesthetized with 1% lidocaine. Using modified Seldinger technique, a 5 French sheath was introduced into the right femoral artery. Standard Judkins catheters were used for coronary angiography and left ventriculography. Catheter exchanges were performed over a guidewire. There were no immediate procedural complications. The patient was transferred to the post catheterization recovery area for further monitoring.  During this procedure the patient is administered a total of Versed 2 mg and Fentanyl 50 mg to achieve and maintain moderate conscious sedation.  The patient's heart rate, blood pressure, and oxygen saturation  are monitored continuously during the procedure. The period of conscious sedation is 16 minutes, of which I was present face-to-face 100% of this time.      Estimated blood loss <50 mL.  During this procedure the patient was administered the following to achieve and maintain moderate conscious sedation: Versed mg,  while the patient's heart rate, blood pressure, and oxygen saturation were continuously monitored.  Medications (Filter: Administrations occurring from 11/24/17 1327 to 11/24/17 1409) (important)  Continuous medications are totaled by the amount administered until 11/24/17 1409.  fentaNYL (SUBLIMAZE) injection (mcg) Total dose:  25 mcg  Date/Time  Rate/Dose/Volume Action  11/24/17 1342  25 mcg Given    midazolam (VERSED) injection (mg) Total dose:  1 mg  Date/Time  Rate/Dose/Volume Action  11/24/17 1342  1 mg Given    Sedation Time  Sedation Time Physician-1: 15 minutes 45 seconds  Radiation/Fluoro  Fluoro time: 2.7 (min) DAP: 5917 (cGycm2) Cumulative Air Kerma: 858 (mGy)  Coronary Findings  Diagnostic Dominance: Right Left Main  Mid LM lesion 40% stenosed  Mid LM lesion is 40% stenosed.  Mid LM to Ost LAD lesion 40% stenosed with side branch in Ost Cx 40% stenosed  Mid LM to Ost LAD lesion is 40% stenosed with 40% stenosed side branch in Ost Cx.  Left Anterior Descending  Prox LAD lesion 35% stenosed  Prox LAD lesion is 35% stenosed.  Left Circumflex  Ost Cx to Prox Cx lesion 25% stenosed  Ost Cx to Prox Cx lesion is 25% stenosed.  Intervention  No interventions have been documented. Wall Motion  Resting               Coronary Diagrams  Diagnostic Dominance: Right  Intervention  Implants  Vascular Products  Device Closure Mynxgrip 55f [id number] - Implanted  Inventory item: DEVICE CLOSURE MYNXGRIP 66F Model/Cat number: [id number]  Manufacturer: [employer] Lot number: [id number]  Device identifier: [id number] Device identifier  type: GS1  Area Of Implantation: Groin    GUDID Information  Request status Successful    Brand name: [facility] Version/Model: [id number]  Company name: [employer]. MRI safety info as of 02/15/19: MR Safe  Contains dry or latex rubber: No    GMDN P.T. name: Wound hydrogel dressing, sterile    As of 11/24/2017  Status: Implanted      Syngo Images  Show images for CARDIAC CATHETERIZATION  Images on Long Term Storage  Show images for Kidus, Bestul to Procedure Log  Procedure Log    Hemo Data (last day) before discharge   AO Systolic Cath Pressure  AO Diastolic Cath Pressure  AO Mean Cath Pressure  LV Systolic Cath Pressure  LV End Diastolic   --  --  --  [id number] mmHg  3 mmHg   --  --  --  126 mmHg  3 mmHg   --  --  --  142 mmHg  9 mmHg   --  --  --  135 mmHg  8 mmHg   --  --  --  137 mmHg  12 mmHg   132  52 mmHg  77 mmHg  --  --   134  58 mmHg  22 mmHg  --  --  Encounter-Level Documents - 11/24/2017:  Scan on 11/25/2017 12:37 PM by Default, Provider, MD Scan on 11/25/2017 12:29 PM by Default, Provider, MD Scan on 11/25/2017 12:08 PM by Default, Provider, MD Scan on 11/25/2017 11:27 AM by Default, Provider, MD Document on 11/24/2017 2:41 PM by Dorette Grate, RN: IP After Visit Summary Document on 11/24/2017 2:41 PM by Dorette Grate, RN: After Visit Summary Scan on 11/24/2017 2:11 PM by Default, Provider, MD Electronic signature on 11/24/2017 11:20 AM - Princess Perna Electronic signature on 11/24/2017 11:20 AM - Princess Perna     Order-Level Documents:  There are no order-level documents. Signed  Electronically signed by Corey Skains, MD on 11/24/17 at 1406 EDT  External Result Report  External Result Report

## 2019-05-14 ENCOUNTER — Other Ambulatory Visit: Payer: Medicare HMO

## 2019-05-17 ENCOUNTER — Encounter
Admission: RE | Admit: 2019-05-17 | Discharge: 2019-05-17 | Disposition: A | Discharge status: Home / Self Care | Payer: Medicare HMO | Source: Ambulatory Visit | Attending: General Surgery | Admitting: General Surgery

## 2019-05-17 ENCOUNTER — Other Ambulatory Visit: Payer: Medicare HMO

## 2019-05-17 ENCOUNTER — Other Ambulatory Visit: Payer: Self-pay

## 2019-05-17 DIAGNOSIS — E119 Type 2 diabetes mellitus without complications: Secondary | ICD-10-CM | POA: Diagnosis not present

## 2019-05-17 DIAGNOSIS — K4090 Unilateral inguinal hernia, without obstruction or gangrene, not specified as recurrent: Secondary | ICD-10-CM | POA: Insufficient documentation

## 2019-05-17 DIAGNOSIS — I10 Essential (primary) hypertension: Secondary | ICD-10-CM | POA: Diagnosis not present

## 2019-05-17 DIAGNOSIS — Z20822 Contact with and (suspected) exposure to covid-19: Secondary | ICD-10-CM | POA: Diagnosis not present

## 2019-05-17 DIAGNOSIS — Z01818 Encounter for other preprocedural examination: Secondary | ICD-10-CM | POA: Diagnosis not present

## 2019-05-17 LAB — BASIC METABOLIC PANEL
Anion gap: 11 (ref 5–15)
BUN: 17 mg/dL (ref 8–23)
CO2: 25 mmol/L (ref 22–32)
Calcium: 9.7 mg/dL (ref 8.9–10.3)
Chloride: 99 mmol/L (ref 98–111)
Creatinine, Ser: 1.04 mg/dL (ref 0.61–1.24)
GFR calc Af Amer: >60 mL/min (ref >60)
GFR calc non Af Amer: >60 mL/min (ref >60)
Glucose, Bld: 213 mg/dL — ABNORMAL HIGH (ref 70–99)
Potassium: 4 mmol/L (ref 3.5–5.1)
Sodium: 135 mmol/L (ref 135–145)

## 2019-05-17 LAB — CBC
HCT: 39.2 % (ref 39.0–52.0)
Hemoglobin: 13.2 g/dL (ref 13.0–17.0)
MCH: 30.6 pg (ref 26.0–34.0)
MCHC: 33.7 g/dL (ref 30.0–36.0)
MCV: 91 fL (ref 80.0–100.0)
Platelets: 237 10*3/uL (ref 150–400)
RBC: 4.31 MIL/uL (ref 4.22–5.81)
RDW: 13 % (ref 11.5–15.5)
WBC: 5.7 10*3/uL (ref 4.0–10.5)
nRBC: 0 % (ref 0.0–0.2)

## 2019-05-17 LAB — SARS CORONAVIRUS 2 (TAT 6-24 HRS): SARS Coronavirus 2: NEGATIVE

## 2019-05-18 MED ORDER — CEFAZOLIN SODIUM-DEXTROSE 2-4 GM/100ML-% IV SOLN
2.0000 g | INTRAVENOUS | Status: AC
  Administered 2019-05-19: 2 g via INTRAVENOUS

## 2019-05-19 ENCOUNTER — Ambulatory Visit
Admission: RE | Admit: 2019-05-19 | Discharge: 2019-05-19 | Disposition: A | Discharge status: Home / Self Care | Payer: Medicare HMO | Source: Ambulatory Visit | Attending: General Surgery | Admitting: General Surgery

## 2019-05-19 ENCOUNTER — Encounter
Admission: RE | Disposition: A | Discharge status: Home / Self Care | Payer: Self-pay | Source: Ambulatory Visit | Attending: General Surgery

## 2019-05-19 ENCOUNTER — Ambulatory Visit: Payer: Medicare HMO | Admitting: Anesthesiology

## 2019-05-19 ENCOUNTER — Encounter: Payer: Self-pay | Admitting: General Surgery

## 2019-05-19 ENCOUNTER — Other Ambulatory Visit: Payer: Self-pay

## 2019-05-19 DIAGNOSIS — I10 Essential (primary) hypertension: Secondary | ICD-10-CM | POA: Diagnosis not present

## 2019-05-19 DIAGNOSIS — K219 Gastro-esophageal reflux disease without esophagitis: Secondary | ICD-10-CM | POA: Insufficient documentation

## 2019-05-19 DIAGNOSIS — Z880 Allergy status to penicillin: Secondary | ICD-10-CM | POA: Diagnosis not present

## 2019-05-19 DIAGNOSIS — Z85820 Personal history of malignant melanoma of skin: Secondary | ICD-10-CM | POA: Insufficient documentation

## 2019-05-19 DIAGNOSIS — D649 Anemia, unspecified: Secondary | ICD-10-CM | POA: Insufficient documentation

## 2019-05-19 DIAGNOSIS — Z886 Allergy status to analgesic agent status: Secondary | ICD-10-CM | POA: Diagnosis not present

## 2019-05-19 DIAGNOSIS — M1990 Unspecified osteoarthritis, unspecified site: Secondary | ICD-10-CM | POA: Diagnosis not present

## 2019-05-19 DIAGNOSIS — E119 Type 2 diabetes mellitus without complications: Secondary | ICD-10-CM | POA: Diagnosis not present

## 2019-05-19 DIAGNOSIS — Z7951 Long term (current) use of inhaled steroids: Secondary | ICD-10-CM | POA: Diagnosis not present

## 2019-05-19 DIAGNOSIS — E785 Hyperlipidemia, unspecified: Secondary | ICD-10-CM | POA: Diagnosis not present

## 2019-05-19 DIAGNOSIS — G4733 Obstructive sleep apnea (adult) (pediatric): Secondary | ICD-10-CM | POA: Insufficient documentation

## 2019-05-19 DIAGNOSIS — Z791 Long term (current) use of non-steroidal anti-inflammatories (NSAID): Secondary | ICD-10-CM | POA: Insufficient documentation

## 2019-05-19 DIAGNOSIS — K4090 Unilateral inguinal hernia, without obstruction or gangrene, not specified as recurrent: Secondary | ICD-10-CM | POA: Diagnosis not present

## 2019-05-19 DIAGNOSIS — Z7984 Long term (current) use of oral hypoglycemic drugs: Secondary | ICD-10-CM | POA: Insufficient documentation

## 2019-05-19 DIAGNOSIS — Z87891 Personal history of nicotine dependence: Secondary | ICD-10-CM | POA: Insufficient documentation

## 2019-05-19 DIAGNOSIS — Z79899 Other long term (current) drug therapy: Secondary | ICD-10-CM | POA: Diagnosis not present

## 2019-05-19 DIAGNOSIS — J45909 Unspecified asthma, uncomplicated: Secondary | ICD-10-CM | POA: Insufficient documentation

## 2019-05-19 HISTORY — PX: INGUINAL HERNIA REPAIR: SHX194

## 2019-05-19 LAB — GLUCOSE, CAPILLARY
Glucose-Capillary: 202 mg/dL — ABNORMAL HIGH (ref 70–99)
Glucose-Capillary: 170 mg/dL — ABNORMAL HIGH (ref 70–99)

## 2019-05-19 SURGERY — REPAIR, HERNIA, INGUINAL, ADULT
Anesthesia: General | Laterality: Right

## 2019-05-19 MED ORDER — ONDANSETRON HCL 4 MG/2ML IJ SOLN
INTRAMUSCULAR | Status: AC
  Filled 2019-05-19: qty 2

## 2019-05-19 MED ORDER — DEXAMETHASONE SODIUM PHOSPHATE 10 MG/ML IJ SOLN
INTRAMUSCULAR | Status: DC | PRN
  Administered 2019-05-19: 8 mg via INTRAVENOUS

## 2019-05-19 MED ORDER — HYDROCODONE-ACETAMINOPHEN 5-325 MG PO TABS: 1.0000 | ORAL_TABLET | ORAL | 0 refills | Status: DC | PRN

## 2019-05-19 MED ORDER — KETOROLAC TROMETHAMINE 30 MG/ML IJ SOLN
INTRAMUSCULAR | Status: AC
  Filled 2019-05-19: qty 1

## 2019-05-19 MED ORDER — ONDANSETRON HCL 4 MG/2ML IJ SOLN: 4.0000 mg | Freq: Once | INTRAMUSCULAR | Status: DC | PRN

## 2019-05-19 MED ORDER — FENTANYL CITRATE (PF) 100 MCG/2ML IJ SOLN
INTRAMUSCULAR | Status: DC | PRN
  Administered 2019-05-19 (×3): 25 ug via INTRAVENOUS

## 2019-05-19 MED ORDER — FENTANYL CITRATE (PF) 100 MCG/2ML IJ SOLN: 25.0000 ug | INTRAMUSCULAR | Status: DC | PRN

## 2019-05-19 MED ORDER — OXYCODONE HCL 5 MG PO TABS: 5.0000 mg | ORAL_TABLET | Freq: Once | ORAL | Status: DC | PRN

## 2019-05-19 MED ORDER — ACETAMINOPHEN 10 MG/ML IV SOLN
INTRAVENOUS | Status: DC | PRN
  Administered 2019-05-19: 1000 mg via INTRAVENOUS

## 2019-05-19 MED ORDER — FENTANYL CITRATE (PF) 100 MCG/2ML IJ SOLN
INTRAMUSCULAR | Status: AC
  Filled 2019-05-19: qty 2

## 2019-05-19 MED ORDER — ACETAMINOPHEN 10 MG/ML IV SOLN: 1000.0000 mg | Freq: Once | INTRAVENOUS | Status: DC | PRN

## 2019-05-19 MED ORDER — OXYCODONE HCL 5 MG/5ML PO SOLN: 5.0000 mg | Freq: Once | ORAL | Status: DC | PRN

## 2019-05-19 MED ORDER — ACETAMINOPHEN 10 MG/ML IV SOLN
INTRAVENOUS | Status: AC
  Filled 2019-05-19: qty 100

## 2019-05-19 MED ORDER — BUPIVACAINE-EPINEPHRINE (PF) 0.5% -1:200000 IJ SOLN
INTRAMUSCULAR | Status: DC | PRN
  Administered 2019-05-19: 20 mL via PERINEURAL

## 2019-05-19 MED ORDER — CEFAZOLIN SODIUM-DEXTROSE 2-4 GM/100ML-% IV SOLN
INTRAVENOUS | Status: AC
  Filled 2019-05-19: qty 100

## 2019-05-19 MED ORDER — ONDANSETRON HCL 4 MG/2ML IJ SOLN
INTRAMUSCULAR | Status: DC | PRN
  Administered 2019-05-19: 4 mg via INTRAVENOUS

## 2019-05-19 MED ORDER — GLYCOPYRROLATE 0.2 MG/ML IJ SOLN
INTRAMUSCULAR | Status: DC | PRN
  Administered 2019-05-19: .2 mg via INTRAVENOUS

## 2019-05-19 MED ORDER — SODIUM CHLORIDE 0.9 % IV SOLN: INTRAVENOUS | Status: DC

## 2019-05-19 MED ORDER — LIDOCAINE HCL (CARDIAC) PF 100 MG/5ML IV SOSY
PREFILLED_SYRINGE | INTRAVENOUS | Status: DC | PRN
  Administered 2019-05-19: 100 mg via INTRAVENOUS

## 2019-05-19 MED ORDER — PROPOFOL 10 MG/ML IV BOLUS
INTRAVENOUS | Status: DC | PRN
  Administered 2019-05-19: 50 mg via INTRAVENOUS
  Administered 2019-05-19: 150 mg via INTRAVENOUS

## 2019-05-19 MED ORDER — DEXAMETHASONE SODIUM PHOSPHATE 10 MG/ML IJ SOLN
INTRAMUSCULAR | Status: AC
  Filled 2019-05-19: qty 1

## 2019-05-19 SURGICAL SUPPLY — 35 items
BLADE SURG 15 STRL SS SAFETY (BLADE) ×6 IMPLANT
CANISTER SUCT 1200ML W/VALVE (MISCELLANEOUS) ×3 IMPLANT
CHLORAPREP W/TINT 26 (MISCELLANEOUS) ×3 IMPLANT
CLOSURE WOUND 1/2 X4 (GAUZE/BANDAGES/DRESSINGS) ×1
COVER WAND RF STERILE (DRAPES) ×3 IMPLANT
DECANTER SPIKE VIAL GLASS SM (MISCELLANEOUS) IMPLANT
DRAIN PENROSE 1/4X12 LTX STRL (WOUND CARE) ×3 IMPLANT
DRAPE LAPAROTOMY 100X77 ABD (DRAPES) ×3 IMPLANT
DRSG TEGADERM 4X4.75 (GAUZE/BANDAGES/DRESSINGS) ×3 IMPLANT
DRSG TELFA 4X3 1S NADH ST (GAUZE/BANDAGES/DRESSINGS) ×3 IMPLANT
ELECT REM PT RETURN 9FT ADLT (ELECTROSURGICAL) ×3
ELECTRODE REM PT RTRN 9FT ADLT (ELECTROSURGICAL) ×1 IMPLANT
GLOVE BIO SURGEON STRL SZ7.5 (GLOVE) ×3 IMPLANT
GLOVE INDICATOR 8.0 STRL GRN (GLOVE) ×3 IMPLANT
GOWN STRL REUS W/ TWL LRG LVL3 (GOWN DISPOSABLE) ×2 IMPLANT
GOWN STRL REUS W/TWL LRG LVL3 (GOWN DISPOSABLE) ×4
KIT TURNOVER KIT A (KITS) ×3 IMPLANT
LABEL OR SOLS (LABEL) ×3 IMPLANT
MESH HERNIA 6X12 ULTRAPRO MED (Mesh General) ×1 IMPLANT
MESH HERNIA ULTRAPRO MED (Mesh General) ×2 IMPLANT
NEEDLE HYPO 22GX1.5 SAFETY (NEEDLE) ×6 IMPLANT
PACK BASIN MINOR ARMC (MISCELLANEOUS) ×3 IMPLANT
STRIP CLOSURE SKIN 1/2X4 (GAUZE/BANDAGES/DRESSINGS) ×2 IMPLANT
SUT PDS AB 0 CT1 27 (SUTURE) ×3 IMPLANT
SUT SURGILON 0 BLK (SUTURE) ×6 IMPLANT
SUT VIC AB 2-0 SH 27 (SUTURE) ×2
SUT VIC AB 2-0 SH 27XBRD (SUTURE) ×1 IMPLANT
SUT VIC AB 3-0 54X BRD REEL (SUTURE) ×1 IMPLANT
SUT VIC AB 3-0 BRD 54 (SUTURE) ×2
SUT VIC AB 3-0 SH 27 (SUTURE) ×2
SUT VIC AB 3-0 SH 27X BRD (SUTURE) ×1 IMPLANT
SUT VIC AB 4-0 FS2 27 (SUTURE) ×3 IMPLANT
SWABSTK COMLB BENZOIN TINCTURE (MISCELLANEOUS) ×3 IMPLANT
SYR 10ML LL (SYRINGE) ×3 IMPLANT
SYR 3ML LL SCALE MARK (SYRINGE) ×3 IMPLANT

## 2019-05-19 NOTE — Op Note (Signed)
Preoperative diagnosis: Right inguinal hernia.  Postoperative diagnosis: Same.  Operative procedure: Right inguinal hernia repair with medium Ultra Pro mesh.  Operating surgeon: Hervey Ard, MD  Anesthesia: General by LMA, Marcaine 0.5% with 1: 200,000 units of epinephrine, 30 cc;  Toradol: 30 mg.  Estimated blood loss: Less than 5 cc.  Clinical note: This 79 year old male is developed a symptomatic right inguinal hernia.  He was admitted for elective repair.  Hair was removed from the surgical site with clippers prior to the procedure.  He received Ancef without incident.  SCD stockings for DVT prevention.  Operative note: With the patient under adequate general anesthesia the area was cleansed with ChloraPrep and draped.  Field block anesthesia was established with the above-mentioned local anesthetic.  A 5 cm skin line incision was made along the anticipated course the inguinal canal.  The skin was incised sharply and the remaining dissection completed with electrocautery.  The external Bleich was opened in the direction of its fibers.  The ilioinguinal and iliohypogastric nerves were identified and protected.  The cremasteric fibers were split and the cord was exposed.  There was a sizable fatty component perhaps 1.5 cm in diameter but it was intricately involved with the adjacent vessels to the testicle.  A generous size hernia sac approximately the size of the pack of playing cards and fairly thick-walled sac suggesting chronic inflammation was identified.  This was dissected free into the preperitoneal space.  It had expanded the indirect ring significantly.  The medium ultra Pro mesh was smoothed into the preperitoneal space and the external component laid along the floor the inguinal canal.  This was anchored to the pubic tubercle with 0 Surgilon suture.  The inferior aspect of the mesh was anchored to the inguinal ligament with similar sutures.  A lateral slit was made for cord passage.   The medial and superior aspects of the mesh were anchored to the transverse abdominis aponeurosis with interrupted 0 Surgilon sutures.  The nerves were returned to their bed.  Toradol was placed into the wound.  The external oblique was closed with a running 2-0 Vicryl suture.  Scarpa's fascia was closed with a running 3-0 Vicryl suture.  The skin was closed with a running 4-0 Vicryl subcuticular suture.  Benzoin, Steri-Strips, Telfa and Tegaderm dressing applied.  The patient tolerated the procedure well and was taken recovery room in stable condition.

## 2019-05-19 NOTE — H&P (Signed)
Connor Hall [id number] 03-Apr-1941     HPI:  79 y/o male with symptomatic right inguinal hernia. For elective repair. No change in clinical history.   Medications Prior to Admission  Medication Sig Dispense Refill Last Dose  . amLODipine (NORVASC) 10 MG tablet Take 10 mg by mouth every morning.    05/19/2019 at Unknown time  . cefdinir (OMNICEF) 300 MG capsule Take 300 mg by mouth 2 (two) times daily.    Past Week at Unknown time  . cyanocobalamin 1000 MCG tablet Take 1,000 mcg by mouth daily.   05/18/2019 at Unknown time  . Ferrous Sulfate (IRON PO) Take 1 tablet by mouth daily.   05/18/2019 at Unknown time  . glipiZIDE (GLUCOTROL XL) 10 MG 24 hr tablet Take 10 mg by mouth daily with breakfast.   05/18/2019 at Unknown time  . guaiFENesin (MUCINEX) 600 MG 12 hr tablet Take 600 mg by mouth 2 (two) times daily.   Past Week at Unknown time  . hydrochlorothiazide (HYDRODIURIL) 25 MG tablet Take 25 mg by mouth daily.   05/18/2019 at Unknown time  . meloxicam (MOBIC) 7.5 MG tablet Take 7.5 mg by mouth 2 (two) times daily.   Past Week at Unknown time  . metFORMIN (GLUCOPHAGE) 1000 MG tablet Take 1,000 mg by mouth 2 (two) times daily with a meal.   Past Week at Unknown time  . mometasone (NASONEX) 50 MCG/ACT nasal spray Place 2 sprays into the nose daily.   05/18/2019 at Unknown time  . Multiple Vitamins-Minerals (CENTRUM SILVER ULTRA MENS) TABS Take 1 tablet by mouth daily.     Marland Kitchen omeprazole (PRILOSEC) 20 MG capsule Take 20 mg by mouth every morning.   05/19/2019 at Unknown time  . pravastatin (PRAVACHOL) 80 MG tablet Take 80 mg by mouth at bedtime.   05/18/2019 at Unknown time  . pyridOXINE (B-6) 50 MG tablet Take 50 mg by mouth daily.   05/18/2019 at Unknown time  . traMADol (ULTRAM) 50 MG tablet Take 50 mg by mouth every 6 (six) hours as needed for moderate pain.    05/18/2019 at Unknown time   Allergies  Allergen Reactions  . Ace Inhibitors   . Penicillins Other (See Comments)    CHILDHOOD ALLERGY   Has patient had a PCN reaction causing immediate rash, facial/tongue/throat swelling, SOB or lightheadedness with hypotension: Unknown Has patient had a PCN reaction causing severe rash involving mucus membranes or skin necrosis: Unknown Has patient had a PCN reaction that required hospitalization: No Has patient had a PCN reaction occurring within the last 10 years: No If all of the above answers are "NO", then may proceed with Cephalosporin use.    . Aspirin Rash  . Other Palpitations    MSG   Past Medical History:  Diagnosis Date  . Anemia   . Anxiety   . Arthritis   . Asthma    WELL CONTROLLED  . Blockage of coronary artery of heart (HCC)    MANAGING WITH MEDICINE-NO STENTS  . Cancer (HCC)    MELANOMA (NOSE, FACE AND HANDS), BASAL CELL  . Cervicalgia   . Depression   . Diabetes mellitus without complication ([place])   . Diverticulitis   . Dyspnea   . GERD (gastroesophageal reflux disease)   . Headache   . Hernia of abdominal cavity   . History of hiatal hernia   . Hyperlipidemia   . Hypertension   . Sinus infection 05/2019   ON CEFDINIR-FINISHED PREDNISONE AND IS IMPROVING  .  Sleep apnea    NO CPAP-PT DENIES SLEEP APNEA BUT STUDY WAS DONE AND PT WAS TOLD HE HAD SLEEP APNEA   Past Surgical History:  Procedure Laterality Date  . APPENDECTOMY    . [place]    . CARPAL TUNNEL RELEASE Bilateral   . CATARACT EXTRACTION W/PHACO Right 04/03/2016   Procedure: CATARACT EXTRACTION PHACO AND INTRAOCULAR LENS PLACEMENT (IOC);  Surgeon: Estill Cotta, MD;  Location: [facility] ORS;  Service: Ophthalmology;  Laterality: Right;  [place] 1.37 AP% 25.8 CDE 44.67 Fluid pack lot # [id number]  . CATARACT EXTRACTION W/PHACO Left 05/29/2016   Procedure: CATARACT EXTRACTION PHACO AND INTRAOCULAR LENS PLACEMENT (IOC);  Surgeon: Estill Cotta, MD;  Location: [facility] ORS;  Service: Ophthalmology;  Laterality: Left;  [place] 01:16 AP% 23.3 CDE  31.18 Fluid pack lot # [id number]  . COLONOSCOPY    . COLONOSCOPY WITH PROPOFOL N/A 01/15/2017   Procedure: COLONOSCOPY WITH PROPOFOL;  Surgeon: Manya Silvas, MD;  Location: [facility] ENDOSCOPY;  Service: Endoscopy;  Laterality: N/A;  . ESOPHAGOGASTRODUODENOSCOPY    . ESOPHAGOGASTRODUODENOSCOPY (EGD) WITH PROPOFOL N/A 01/15/2017   Procedure: ESOPHAGOGASTRODUODENOSCOPY (EGD) WITH PROPOFOL;  Surgeon: Manya Silvas, MD;  Location: [facility] ENDOSCOPY;  Service: Endoscopy;  Laterality: N/A;  . KNEE ARTHROSCOPY Left 08/30/2015   Procedure: ARTHROSCOPY KNEE LEFT, partial medial menisectomy, chondral debridement;  Surgeon: Leanor Kail, MD;  Location: [facility] ORS;  Service: Orthopedics;  Laterality: Left;  . LEFT HEART CATH AND CORONARY ANGIOGRAPHY Left 11/24/2017   Procedure: LEFT HEART CATH AND CORONARY ANGIOGRAPHY;  Surgeon: Corey Skains, MD;  Location: [place] [facility];  Service: Cardiovascular;  Laterality: Left;  . MOHS SURGERY    . POLYPECTOMY    . ROTATOR CUFF REPAIR Right   . TRIGGER FINGER RELEASE     Social History   Socioeconomic History  . Marital status: Married    Spouse name: Not on file  . Number of children: Not on file  . Years of education: Not on file  . Highest education level: Not on file  Occupational History  . Not on file  Tobacco Use  . Smoking status: Former Smoker    Packs/day: 0.50    Years: 13.00    Pack years: 6.50    Types: Cigarettes    Quit date: 08/28/1975    Years since quitting: 43.7  . Smokeless tobacco: Never Used  Substance and Sexual Activity  . 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 Drug use: No  . Sexual activity: Not on file  Other Topics Concern  . Not on file  Social History Narrative  . Not on file   Social Determinants of Health   Financial Resource Strain:   . Difficulty of Paying Living Expenses: Not on file  Food Insecurity:   . Worried About Charity fundraiser in the Last  Year: Not on file  . Ran Out of Food in the Last Year: Not on file  Transportation Needs:   . Lack of Transportation (Medical): Not on file  . Lack of Transportation (Non-Medical): Not on file  Physical Activity:   . Days of Exercise per Week: Not on file  . Minutes of Exercise per Session: Not on file  Stress:   . Feeling of Stress : Not on file  Social Connections:   . Frequency of Communication with Friends and Family: Not on file  .  Frequency of Social Gatherings with Friends and Family: Not on file  . Attends Religious Services: Not on file  . Active Member of Clubs or Organizations: Not on file  . Attends Archivist Meetings: Not on file  . Marital Status: Not on file  Intimate Partner Violence:   . Fear of Current or Ex-Partner: Not on file  . Emotionally Abused: Not on file  . Physically Abused: Not on file  . Sexually Abused: Not on file   Social History   Social History Narrative  . Not on file     ROS: Negative.     PE: HEENT: Negative. Lungs: Clear. Cardio: RR.  Assessment/Plan:  Proceed with planned right inguinal hernia repair.   Forest Gleason [facility] 05/19/2019

## 2019-05-19 NOTE — Anesthesia Procedure Notes (Signed)
Procedure Name: LMA Insertion Date/Time: 05/19/2019 12:07 PM Performed by: Hedda Slade, CRNA Pre-anesthesia Checklist: Patient identified, Patient being monitored, Timeout performed, Emergency Drugs available and Suction available Patient Re-evaluated:Patient Re-evaluated prior to induction Oxygen Delivery Method: Circle system utilized Preoxygenation: Pre-oxygenation with 100% oxygen Induction Type: IV induction Ventilation: Mask ventilation without difficulty LMA: LMA inserted LMA Size: 5.0 Tube type: Oral Number of attempts: 1 Placement Confirmation: positive ETCO2 and breath sounds checked- equal and bilateral Tube secured with: Tape Dental Injury: Teeth and Oropharynx as per pre-operative assessment  Comments: Poor seal with 4.5 LMA, unable to deliver adequate tidal volumes.

## 2019-05-19 NOTE — Anesthesia Postprocedure Evaluation (Signed)
Anesthesia Post Note  Patient: Connor Hall  Procedure(s) Performed: HERNIA REPAIR INGUINAL ADULT (Right )  Patient location during evaluation: PACU Anesthesia Type: General Level of consciousness: awake and alert Pain management: pain level controlled Vital Signs Assessment: post-procedure vital signs reviewed and stable Respiratory status: spontaneous breathing, nonlabored ventilation, respiratory function stable and patient connected to nasal cannula oxygen Cardiovascular status: blood pressure returned to baseline and stable Postop Assessment: no apparent nausea or vomiting Anesthetic complications: no     Last Vitals:  Vitals:   05/19/19 1315 05/19/19 1323  BP: (!) 114/59   Pulse: 69 88  Resp: 12 14  Temp: (!) 36.3 C   SpO2: 100% 99%    Last Pain:  Vitals:   05/19/19 1323  TempSrc:   PainSc: 0-No pain                 Arita Miss

## 2019-05-19 NOTE — Discharge Instructions (Signed)

## 2019-05-19 NOTE — Anesthesia Preprocedure Evaluation (Addendum)
Anesthesia Evaluation  Patient identified by MRN, date of birth, ID band Patient awake    Reviewed: Allergy & Precautions, NPO status , Patient's Chart, lab work & pertinent test results  History of Anesthesia Complications Negative for: history of anesthetic complications  Airway Mallampati: II  TM Distance: >3 FB Neck ROM: Full    Dental no notable dental hx. (+) Teeth Intact   Pulmonary neg sleep apnea, neg COPD, Patient abstained from smoking.Not current smoker, former smoker,  OSA in chart but patient denies Asthma as a child   Pulmonary exam normal breath sounds clear to auscultation       Cardiovascular Exercise Tolerance: Good METShypertension, + angina + CAD  (-) Past MI (-) dysrhythmias  Rhythm:Regular Rate:Normal - Systolic murmurs Cardiac cath 2019:  Mid LM lesion is 40% stenosed.  Mid LM to Ost LAD lesion is 40% stenosed with 40% stenosed side branch in Ost Cx.  Prox LAD lesion is 35% stenosed.  Ost Cx to Prox Cx lesion is 25% stenosed. Per cardiology no intervention needed. EF 60%   Neuro/Psych PSYCHIATRIC DISORDERS Anxiety Depression negative neurological ROS  negative psych ROS   GI/Hepatic hiatal hernia, GERD  ,(+)     (-) substance abuse  ,   Endo/Other  diabetes  Renal/GU negative Renal ROS     Musculoskeletal   Abdominal   Peds  Hematology   Anesthesia Other Findings Past Medical History: No date: Anemia No date: Anxiety No date: Arthritis No date: Asthma     Comment:  WELL CONTROLLED No date: Blockage of coronary artery of heart (HCC)     Comment:  MANAGING WITH MEDICINE-NO STENTS No date: Cancer ([place])     Comment:  MELANOMA (NOSE, FACE AND HANDS), BASAL CELL No date: Cervicalgia No date: Depression No date: Diabetes mellitus without complication (HCC) No date: Diverticulitis No date: Dyspnea No date: GERD (gastroesophageal reflux disease) No date: Headache No date: Hernia  of abdominal cavity No date: History of hiatal hernia No date: Hyperlipidemia No date: Hypertension 05/2019: Sinus infection     Comment:  ON CEFDINIR-FINISHED PREDNISONE AND IS IMPROVING No date: Sleep apnea     Comment:  NO CPAP-PT DENIES SLEEP APNEA BUT STUDY WAS DONE AND PT               WAS TOLD HE HAD SLEEP APNEA  Reproductive/Obstetrics                            Anesthesia Physical Anesthesia Plan  ASA: II  Anesthesia Plan: General   Post-op Pain Management:    Induction: Intravenous  PONV Risk Score and Plan: 3 and Ondansetron and Dexamethasone  Airway Management Planned: Oral ETT  Additional Equipment: None  Intra-op Plan:   Post-operative Plan: Extubation in OR  Informed Consent: I have reviewed the patients History and Physical, chart, labs and discussed the procedure including the risks, benefits and alternatives for the proposed anesthesia with the patient or authorized representative who has indicated his/her understanding and acceptance.     Dental advisory given  Plan Discussed with: CRNA and Surgeon  Anesthesia Plan Comments: (Discussed risks of anesthesia with patient, including PONV, sore throat, lip/dental damage. Rare risks discussed as well, such as cardiorespiratory sequelae. Patient understands.)        Anesthesia Quick Evaluation

## 2019-05-19 NOTE — Transfer of Care (Signed)
Immediate Anesthesia Transfer of Care Note  Patient: Connor Hall  Procedure(s) Performed: HERNIA REPAIR INGUINAL ADULT (Right )  Patient Location: PACU  Anesthesia Type:General  Level of Consciousness: sedated  Airway & Oxygen Therapy: Patient Spontanous Breathing and Patient connected to face mask oxygen  Post-op Assessment: Report given to RN and Post -op Vital signs reviewed and stable  Post vital signs: Reviewed and stable  Last Vitals:  Vitals Value Taken Time  BP 114/59 05/19/19 1315  Temp    Pulse 69 05/19/19 1316  Resp 8 05/19/19 1316  SpO2 98 % 05/19/19 1316  Vitals shown include unvalidated device data.  Last Pain:  Vitals:   05/19/19 1315  TempSrc:   PainSc: 0-No pain         Complications: No apparent anesthesia complications

## 2019-06-04 ENCOUNTER — Ambulatory Visit: Payer: Medicare HMO

## 2019-12-05 DIAGNOSIS — U071 COVID-19: Secondary | ICD-10-CM

## 2019-12-05 DIAGNOSIS — Z8616 Personal history of COVID-19: Secondary | ICD-10-CM

## 2019-12-05 HISTORY — DX: COVID-19: U07.1

## 2019-12-05 HISTORY — DX: Personal history of COVID-19: Z86.16

## 2020-02-21 ENCOUNTER — Encounter: Payer: Self-pay | Admitting: Orthopedic Surgery

## 2020-02-21 ENCOUNTER — Other Ambulatory Visit: Payer: Self-pay | Admitting: Orthopedic Surgery

## 2020-02-21 NOTE — H&P (Signed)
Connor Hall MRN:  [id number] DOB/SEX:  20-Aug-1940/male  CHIEF COMPLAINT:  Painful right Knee  HISTORY: Patient is a 79 y.o. male presented with a history of pain in the right knee. Onset of symptoms was gradual starting several years ago with gradually worsening course since that time. Prior procedures on the knee include none. Patient has been treated conservatively with over-the-counter NSAIDs and activity modification. Patient currently rates pain in the knee at 10 out of 10 with activity. There is pain at night.  PAST MEDICAL HISTORY: Patient Active Problem List   Diagnosis Date Noted  . Coronary artery disease involving native coronary artery of native heart 12/08/2017  . Angina decubitus ([place]) 11/24/2017  . SOBOE (shortness of breath on exertion) 11/20/2017  . Pain of left hand 08/21/2017  . Carpal tunnel syndrome of left wrist 08/01/2017  . Anemia 10/11/2016  . FH: colon polyps 10/11/2016  . Primary osteoarthritis of left knee 10/09/2015  . Tear of meniscus of knee 07/26/2015  . Left knee pain 07/24/2015  . Subcoracoid bursitis of right shoulder 12/09/2014  . Rotator cuff tear, non-traumatic, right 07/07/2014  . Impingement syndrome, shoulder, right 03/23/2014  . Malignant neoplasm of skin of nose 02/07/2014  . Asthma without status asthmaticus 11/03/2013  . Diabetes mellitus type 2, uncomplicated ([place]) 14/97/0263  . Hyperlipidemia 11/03/2013  . Hypertension 11/03/2013  . Obesity 11/03/2013   Past Medical History:  Diagnosis Date  . Anemia   . Anxiety   . Arthritis   . Asthma    WELL CONTROLLED  . Blockage of coronary artery of heart (HCC)    MANAGING WITH MEDICINE-NO STENTS  . Cancer (HCC)    MELANOMA (NOSE, FACE AND HANDS), BASAL CELL  . Cervicalgia   . Depression   . Diabetes mellitus without complication ([place])   . Diverticulitis   . Dyspnea   . GERD (gastroesophageal reflux disease)   . Headache   . Hernia of abdominal cavity   . History of hiatal hernia    . Hyperlipidemia   . Hypertension   . Sinus infection 05/2019   ON CEFDINIR-FINISHED PREDNISONE AND IS IMPROVING  . Sleep apnea    NO CPAP-PT DENIES SLEEP APNEA BUT STUDY WAS DONE AND PT WAS TOLD HE HAD SLEEP APNEA   Past Surgical History:  Procedure Laterality Date  . APPENDECTOMY    . [place]    . CARPAL TUNNEL RELEASE Bilateral   . CATARACT EXTRACTION W/PHACO Right 04/03/2016   Procedure: CATARACT EXTRACTION PHACO AND INTRAOCULAR LENS PLACEMENT (IOC);  Surgeon: Estill Cotta, MD;  Location: [facility] ORS;  Service: Ophthalmology;  Laterality: Right;  [place] 1.37 AP% 25.8 CDE 44.67 Fluid pack lot # [id number]  . CATARACT EXTRACTION W/PHACO Left 05/29/2016   Procedure: CATARACT EXTRACTION PHACO AND INTRAOCULAR LENS PLACEMENT (IOC);  Surgeon: Estill Cotta, MD;  Location: [facility] ORS;  Service: Ophthalmology;  Laterality: Left;  [place] 01:16 AP% 23.3 CDE 31.18 Fluid pack lot # [id number]  . COLONOSCOPY    . COLONOSCOPY WITH PROPOFOL N/A 01/15/2017   Procedure: COLONOSCOPY WITH PROPOFOL;  Surgeon: Manya Silvas, MD;  Location[facility];  Service: Endoscopy;  Laterality: N/A;  . ESOPHAGOGASTRODUODENOSCOPY    . ESOPHAGOGASTRODUODENOSCOPY (EGD) WITH PROPOFOL N/A 01/15/2017   Procedure: ESOPHAGOGASTRODUODENOSCOPY (EGD) WITH PROPOFOL;  Surgeon: Manya Silvas, MD;  [facility] END[facility];  Service: Endoscopy;  Laterality: N/A;  . INGUINAL HERNIA REPAIR Right 05/19/2019   Procedure: HERNIA REPAIR INGUINAL  ADULT;  Surgeon: Robert Bellow, MD;  Location: [facility] ORS;  Service: General;  Laterality: Right;  . KNEE ARTHROSCOPY Left 08/30/2015   Procedure: ARTHROSCOPY KNEE LEFT, partial medial menisectomy, chondral debridement;  Surgeon: Leanor Kail, MD;  Location: [facility] ORS;  Service: Orthopedics;  Laterality: Left;  . LEFT HEART CATH AND CORONARY ANGIOGRAPHY Left 11/24/2017   Procedure: LEFT HEART CATH AND CORONARY ANGIOGRAPHY;   Surgeon: Corey Skains, MD;  Location: [place] [facility] LAB;  Service: Cardiovascular;  Laterality: Left;  . MOHS SURGERY    . POLYPECTOMY    . ROTATOR CUFF REPAIR Right   . TRIGGER FINGER RELEASE       MEDICATIONS:  (Not in a hospital admission)   ALLERGIES:   Allergies  Allergen Reactions  . Ace Inhibitors Swelling  . Penicillins Other (See Comments)    CHILDHOOD ALLERGY  Has patient had a PCN reaction causing immediate rash, facial/tongue/throat swelling, SOB or lightheadedness with hypotension: Unknown Has patient had a PCN reaction causing severe rash involving mucus membranes or skin necrosis: Unknown Has patient had a PCN reaction that required hospitalization: No Has patient had a PCN reaction occurring within the last 10 years: No If all of the above answers are "NO", then may proceed with Cephalosporin use.    . Aspirin Rash  . Other Palpitations    MSG    REVIEW OF SYSTEMS:  Pertinent items noted in HPI and remainder of comprehensive ROS otherwise negative.   FAMILY HISTORY:   Family History  Problem Relation Age of Onset  . Pancreatic cancer Mother   . Heart attack Father     SOCIAL HISTORY:   Social History   Tobacco Use  . Smoking status: Former Smoker    Packs/day: 0.50    Years: 13.00    Pack years: 6.50    Types: Cigarettes    Quit date: 08/28/1975    Years since quitting: 44.5  . Smokeless tobacco: Never Used  Substance Use Topics  . Alcohol use: Not Currently    Comment: used to drink daily and quit 15 years ago     EXAMINATION:  Vital signs in last 24 hours: @VSRANGES @  General appearance: alert, cooperative and no distress Neck: no JVD and supple, symmetrical, trachea midline Lungs: clear to auscultation bilaterally Heart: regular rate and rhythm, S1, S2 normal, no murmur, click, rub or gallop Abdomen: soft, non-tender; bowel sounds normal; no masses,  no organomegaly Extremities: extremities normal, atraumatic, no cyanosis or  edema and Homans sign is negative, no sign of DVT Pulses: 2+ and symmetric Skin: Skin color, texture, turgor normal. No rashes or lesions Neurologic: Alert and oriented X 3, normal strength and tone. Normal symmetric reflexes. Normal coordination and gait  Musculoskeletal:  ROM 0-100, Ligaments intact,  Imaging Review Plain radiographs demonstrate severe degenerative joint disease of the right knee. The overall alignment is significant varus. The bone quality appears to be good for age and reported activity level.  Assessment/Plan: Primary osteoarthritis, right knee   The patient history, physical examination and imaging studies are consistent with advanced degenerative joint disease of the right knee. The patient has failed conservative treatment.  The clearance notes were reviewed.  After discussion with the patient it was felt that Total Knee Replacement was indicated. The procedure,  risks, and benefits of total knee arthroplasty were presented and reviewed. The risks including but not limited to aseptic loosening, infection, blood clots, vascular injury, stiffness, patella tracking problems complications among others were discussed. The  patient acknowledged the explanation, agreed to proceed with the plan.  Carlynn Spry 02/21/2020, 3:09 PM

## 2020-02-22 ENCOUNTER — Encounter: Payer: Self-pay | Admitting: Urgent Care

## 2020-02-22 ENCOUNTER — Other Ambulatory Visit: Payer: Self-pay

## 2020-02-22 ENCOUNTER — Encounter: Payer: Self-pay | Admitting: Orthopedic Surgery

## 2020-02-22 ENCOUNTER — Encounter
Admission: RE | Admit: 2020-02-22 | Discharge: 2020-02-22 | Disposition: A | Discharge status: Home / Self Care | Payer: Medicare HMO | Source: Ambulatory Visit | Attending: Orthopedic Surgery | Admitting: Orthopedic Surgery

## 2020-02-22 DIAGNOSIS — Z01812 Encounter for preprocedural laboratory examination: Secondary | ICD-10-CM | POA: Insufficient documentation

## 2020-02-22 LAB — URINALYSIS, ROUTINE W REFLEX MICROSCOPIC
Bilirubin Urine: NEGATIVE
Glucose, UA: NEGATIVE mg/dL
Hgb urine dipstick: NEGATIVE
Ketones, ur: NEGATIVE mg/dL
Leukocytes,Ua: NEGATIVE
Nitrite: NEGATIVE
Protein, ur: NEGATIVE mg/dL
Specific Gravity, Urine: 1.013 (ref 1.005–1.030)
pH: 5 (ref 5.0–8.0)

## 2020-02-22 LAB — BASIC METABOLIC PANEL
Anion gap: 10 (ref 5–15)
BUN: 20 mg/dL (ref 8–23)
CO2: 27 mmol/L (ref 22–32)
Calcium: 9.6 mg/dL (ref 8.9–10.3)
Chloride: 98 mmol/L (ref 98–111)
Creatinine, Ser: 1.08 mg/dL (ref 0.61–1.24)
GFR, Estimated: >60 mL/min (ref >60)
Glucose, Bld: 131 mg/dL — ABNORMAL HIGH (ref 70–99)
Potassium: 4.3 mmol/L (ref 3.5–5.1)
Sodium: 135 mmol/L (ref 135–145)

## 2020-02-22 LAB — TYPE AND SCREEN
ABO/RH(D): O POS
Antibody Screen: NEGATIVE

## 2020-02-22 LAB — SURGICAL PCR SCREEN
MRSA, PCR: POSITIVE — AB
Staphylococcus aureus: POSITIVE — AB

## 2020-02-22 LAB — CBC
HCT: 37.3 % — ABNORMAL LOW (ref 39.0–52.0)
Hemoglobin: 12.6 g/dL — ABNORMAL LOW (ref 13.0–17.0)
MCH: 30.8 pg (ref 26.0–34.0)
MCHC: 33.8 g/dL (ref 30.0–36.0)
MCV: 91.2 fL (ref 80.0–100.0)
Platelets: 253 10*3/uL (ref 150–400)
RBC: 4.09 MIL/uL — ABNORMAL LOW (ref 4.22–5.81)
RDW: 13.2 % (ref 11.5–15.5)
WBC: 5.6 10*3/uL (ref 4.0–10.5)
nRBC: 0 % (ref 0.0–0.2)

## 2020-02-22 LAB — APTT: aPTT: 32 seconds (ref 24–36)

## 2020-02-22 NOTE — Patient Instructions (Signed)
Your procedure is scheduled on: Mon 11/1 Report to Day Surgery. To find out your arrival time please call [phone] between 1PM - 3PM on 10/29  Remember: Instructions that are not followed completely may result in serious medical risk,  up to and including death, or upon the discretion of your surgeon and anesthesiologist your  surgery may need to be rescheduled.     _X__ 1. Do not eat food after midnight the night before your procedure.                 No chewing gum or hard candies. You may drink clear liquids up to 2 hours                 before you are scheduled to arrive for your surgery- DO not drink clear                 liquids within 2 hours of the start of your surgery.                 Clear Liquids include:  water, apple juice without pulp, clear Gatorade, G2 or                  Gatorade Zero (avoid Red/Purple/Blue), Black Coffee or Tea (Do not add                 anything to coffee or tea). _____2.   Complete the "Ensure Clear Pre-surgery Clear Carbohydrate Drink" provided to you, 2 hours before arrival. **If you       are diabetic you will be provided with an alternative drink, Gatorade Zero or G2.  __X__2.  On the morning of surgery brush your teeth with toothpaste and water, you                may rinse your mouth with mouthwash if you wish.  Do not swallow any toothpaste of mouthwash.     ___ 3.  No Alcohol for 24 hours before or after surgery.   ___ 4.  Do Not Smoke or use e-cigarettes For 24 Hours Prior to Your Surgery.                 Do not use any chewable tobacco products for at least 6 hours prior to                 Surgery.  ___  5.  Do not use any recreational drugs (marijuana, cocaine, heroin, ecstasy, MDMA or other)                For at least one week prior to your surgery.  Combination of these drugs with anesthesia                May have life threatening results.  ____  6.  Bring all medications with you on the day of surgery if  instructed.   _x___  7.  Notify your doctor if there is any change in your medical condition      (cold, fever, infections).     Do not wear jewelry,  Do not wear lotions, You may wear deodorant. Do not shave 48 hours prior to surgery. Men may shave face and neck. Do not bring valuables to the hospital.    [facility] is not responsible for any belongings or valuables.  Contacts, dentures or bridgework may not be worn into surgery. Leave your suitcase in the car. After surgery it may be  brought to your room. For patients admitted to the hospital, discharge time is determined by your treatment team.   Patients discharged the day of surgery will not be allowed to drive home.   Make arrangements for someone to be with you for the first 24 hours of your Same Day Discharge.    Please read over the following fact sheets that you were given:    __x__ Take these medicines the morning of surgery with A SIP OF WATER:    1. Pain med if needed  2. amLODipine (NORVASC) 10 MG tablet  3. omeprazole (PRILOSEC) 20 MG capsule night before and morning of surgery  4.  5.  6.  ____ Fleet Enema (as directed)   ___x_ Use CHG Soap (or wipes) as directed  ____ Use Benzoyl Peroxide Gel as instructed  ____ Use inhalers on the day of surgery  __x__ Stop metformin 2 days prior to surgery 10/29 last dose    ____ Take 1/2 of usual insulin dose the night before surgery. No insulin the morning          of surgery.   ____ Stop Coumadin/Plavix/aspirin on   _x___ Stop Anti-inflammatories  meloxicam (MOBIC) 7.5 MG tablet 10/25   __x__ Stop supplements until after surgery.  Melatonin 10 MG TABS 10/28  ____ Bring C-Pap to the hospital.    If you have any questions regarding your pre-procedure instructions,  Please call Pre-admit Testing at [phone]

## 2020-02-22 NOTE — Progress Notes (Signed)
[facility] Perioperative Services: Pre-Admission/Anesthesia Testing   Date: 02/22/20 Name: Connor Hall MRN:   [id number]  Re: Consideration of preoperative prophylactic antibiotic change   Request sent to: Lovell Sheehan, MD and Carlynn Spry, PA-C (routed and/or faxed via [facility])  Planned Surgical Procedure(s):    Case: [id number] Date/Time: 03/06/20 1424   Procedure: TOTAL KNEE ARTHROPLASTY (Right Knee)   Anesthesia type: Spinal   Pre-op diagnosis: M17.11 Unilateral primary osteoarthritis, right knee   Location: [facility] OR ROOM 02 / [place] ORS FOR ANESTHESIA GROUP   Surgeons: Lovell Sheehan, MD     Notes: 1. Patient has a documented allergy to PCN  2. The actual reaction to PCN is unknown. Advising that this was a childhood allergy.  3. Received cephalosporin with no documented complications . CEFAZOLIN received on 05/19/2019 4. Screened as appropriate for cephalosporin use during medication reconciliation . No immediate angioedema, dysphagia, SOB, anaphylaxis symptoms. . No severe rash involving mucous membranes or skin necrosis. . No hospital admissions related to side effects of PCN/cephalosporin use.  . No documented reaction to PCN or cephalosporin in the last 10 years.  Request:  As an evidence based approach to reducing the rate of incidence for post-operative SSI and the development of MDROs, could an agent with narrower coverage for preoperative prophylaxis in this patient's upcoming surgical course be considered?  1. Currently ordered preoperative prophylactic ABX: clindamycin.   2. Specifically requesting change to cephalosporin (CEFAZOLIN).   3. Please communicate decision with me and I will change the orders in Epic as per your direction.   Things to consider:  Many patients report that they were "allergic" to PCN earlier in life, however this does not translate into a true lifelong allergy. Patients can lose sensitivity to specific IgE  antibodies over time if PCN is avoided (Kleris & Lugar, 2019).   Up to 10% of the adult population and 15% of hospitalized patients report an allergy to PCN, however clinical studies suggest that 90% of those reporting an allergy can tolerate PCN antibiotics (Kleris & Lugar, 2019).   Cross-sensitivity between PCN and cephalosporins has been documented as being as high as 10%, however this estimation included data believed to have been collected in a setting where there was contamination. Newer data suggests that the prevalence of cross-sensitivity between PCN and cephalosporins is actually estimated to be closer to 1% (Hermanides et al., 2018).    Patients labeled as PCN allergic, whether they are truly allergic or not, have been found to have inferior outcomes in terms of rates of serious infection, and these patients tend to have longer hospital stays ([place], 2019).   Treatment related secondary infections, such as Clostridioides difficile, have been linked to the improper use of broad spectrum antibiotics in patients improperly labeled as PCN allergic (Kleris & Lugar, 2019).   Anaphylaxis from cephalosporins is rare and the evidence suggests that there is no increased risk of an anaphylactic type reaction when cephalosporins are used in a PCN allergic patient (Pichichero, 2006).  Citations: Hermanides J, Lemkes BA, Prins Pearla Dubonnet MW, [place] I. Presumed ?-Lactam Allergy and Cross-reactivity in the Operating Theater: A Practical Approach. Anesthesiology. 2018 Aug;129(2):335-342. doi: 10.1097/ALN.0000000000002252. PMID: 33295188.  Kleris, [place]., & Lugar, P. L. (2019). Things We Do For No Reason: Failing to Question a Penicillin Allergy History. Journal of hospital medicine, 14(10), [phone]. Advance online publication. https://www.wallace-[place]leton.info/  Pichichero, M. E. (2006). Cephalosporins can be prescribed safely for penicillin-allergic patients. Journal of family  medicine,  55(2), 106-112. Accessed: https://cdn.mdedge.com/files/s69fs-public/Document/September-2017/5502JFP_AppliedEvidence1.pdf   Honor Loh, MSN, APRN, FNP-C, CEN [facility]  Peri-operative Services Nurse Practitioner 02/22/20 12:58 PM

## 2020-02-22 NOTE — Progress Notes (Signed)
  [facility] Perioperative Services: Pre-Admission/Anesthesia Testing  Abnormal Lab Notification  Date: 02/22/20  Name: Connor Hall MRN:   [id number]  Re: Abnormal labs noted during PAT appointment  Provider Notified: Lovell Sheehan, MD and Carlynn Spry, PA-C Notification mode: Routed and/or faxed via [facility] of concern: Lab Results  Component Value Date   STAPHAUREUS POSITIVE (A) 02/22/2020   MRSAPCR POSITIVE (A) 02/22/2020    Notes: Patient is scheduled for a TOTAL KNEE ARTHROPLASTY (Right Knee) on 11/01/20021. This is a Community education officer; no formal response required.   Honor Loh, MSN, APRN, FNP-C, CEN [facility]  Peri-operative Services Nurse Practitioner Phone: [phone] 02/22/20 3:32 PM

## 2020-02-25 ENCOUNTER — Other Ambulatory Visit: Payer: Medicare HMO

## 2020-02-25 ENCOUNTER — Encounter: Payer: Self-pay | Admitting: Orthopedic Surgery

## 2020-02-25 ENCOUNTER — Other Ambulatory Visit: Payer: Self-pay | Admitting: Orthopedic Surgery

## 2020-02-25 NOTE — Progress Notes (Signed)
[facility] Perioperative Services  Pre-Admission/Anesthesia Testing Clinical Review  Date: 02/25/20  Patient Demographics:  Name: Connor Hall DOB:   12-19-1940 MRN:   [id number]  Planned Surgical Procedure(s):    Case: [id number] Date/Time: 03/08/20 0815   Procedure: TOTAL KNEE ARTHROPLASTY (Right Knee)   Anesthesia type: Spinal   Pre-op diagnosis: M17.11 Unilateral primary osteoarthritis, right knee   Location: [facility] OR ROOM 02 / [facility]   Surgeons: Lovell Sheehan, MD     NOTE: Available PAT nursing documentation and vital signs have been reviewed. Clinical nursing staff has updated patient's PMH/PSHx, current medication list, and drug allergies/intolerances to ensure comprehensive history available to assist in medical decision making as it pertains to the aforementioned surgical procedure and anticipated anesthetic course.   Clinical Discussion:  Connor Hall is a 79 y.o. male who is submitted for pre-surgical anesthesia review and clearance prior to him undergoing the above procedure. Patient is a Former Smoker (6.5 pack years; quit 08/1975). Pertinent PMH includes: angina decubitus, CAD, HTN, HLD, T2DM, GERD (on daily PPI), hiatal hernia, anemia, asthma, exertional dyspnea, OSAH (no nocturnal PAP therapy), OA, cervicalgia, anxiety, depression.  Patient is followed by cardiology Nehemiah Massed, MD). He was last seen in the cardiology clinic on 02/17/2020; notes reviewed.  At the time of his last clinic visit, patient was doing well overall from a cardiovascular standpoint.  He denied any chest pain, increased shortness of breath, PND, orthopnea, palpitations, peripheral edema, vertiginous symptoms, or presyncope/syncope.  Hypertension well controlled on GDMT using a thiazide diuretic and CCB.  Patient is on a statin for his HLD.  Functional capacity documented as being >4 METS.  Last TTE performed in 11/2017 revealed normal LV systolic function  with an LVEF of >55%.  Patient had an abnormal stress echocardiogram that revealed stress-induced inferolateral hypokinesis.  Subsequent left heart catheterization and coronary angiography in 11/2017 demonstrated mild two-vessel CAD; 40% mid LM lesion, 40% mid LM to ostial LAD lesion with 40% stenosed side branch and Ost Cx, 35% proximal LAD, 25% Ost Cx to proximal Cx); LVEF 60%.  No changes were made to patient's medication regimen.  Patient scheduled follow-up with outpatient cardiology in 6 months.  Patient scheduled to undergo elective orthopedic procedure on 03/08/2020 with Dr. Kurtis Bushman.  Given patient's past medical history and cardiac issues, presurgical cardiac clearance was sought by the performing surgeon's office.  Per cardiology, "patient may proceed to surgery without restriction.  He is at the lowest risk possible for cardiovascular complication with surgical intervention.  Currently has no evidence of active significant angina or CHF.  Overall risk of cardiac complication with surgery is felt to be low; <1%". This patient is not on daily anticoagulation therapy.   He reports previous perioperative complications with anesthesia.  Patient reporting that he has been advised that he has been difficult to intubate in the past.  Patient noting previous damage to his throat following intubation attempts. He underwent a general anesthetic course here (ASA II) in 05/2019 with no documented complications.   Vitals with BMI 02/22/2020 05/19/2019 05/19/2019  Height 6\' 0"  - -  Weight 213 lbs 13 oz - -  BMI 73.53 - -  Systolic - 299 242  Diastolic - 65 71  Pulse 67 77 78    Providers/Specialists:   NOTE: Primary physician provider listed below. Patient may have been seen by APP or partner within same practice.   PROVIDER ROLE LAST Jayme Cloud,  MD Orthopedic (Surgeon) 02/09/2020  Baxter Hire, MD Primary Care Provider 01/24/2020  Serafina Royals, MD Cardiology 02/17/2020    Allergies:  Ace inhibitors, Penicillins, Aspirin, and Other  Current Home Medications:   No current facility-administered medications for this encounter.   Marland Kitchen acetaminophen (TYLENOL) 650 MG CR tablet  . amLODipine (NORVASC) 10 MG tablet  . cyanocobalamin 1000 MCG tablet  . ferrous sulfate 325 (65 FE) MG tablet  . glipiZIDE (GLUCOTROL XL) 10 MG 24 hr tablet  . hydrochlorothiazide (HYDRODIURIL) 25 MG tablet  . Melatonin 10 MG TABS  . meloxicam (MOBIC) 7.5 MG tablet  . metFORMIN (GLUCOPHAGE) 1000 MG tablet  . mometasone (NASONEX) 50 MCG/ACT nasal spray  . Multiple Vitamins-Minerals (CENTRUM SILVER ULTRA MENS) TABS  . omeprazole (PRILOSEC) 20 MG capsule  . pravastatin (PRAVACHOL) 80 MG tablet  . pyridOXINE (B-6) 50 MG tablet  . Salicylic Acid-Urea (KERASAL EX)  . traMADol (ULTRAM) 50 MG tablet  . cefdinir (OMNICEF) 300 MG capsule  . Ferrous Sulfate (IRON PO)  . guaiFENesin (MUCINEX) 600 MG 12 hr tablet  . HYDROcodone-acetaminophen (NORCO/VICODIN) 5-325 MG tablet   History:   Past Medical History:  Diagnosis Date  . Anemia   . Anxiety   . Arthritis   . Asthma    WELL CONTROLLED  . Blockage of coronary artery of heart (HCC)    MANAGING WITH MEDICINE-NO STENTS  . Cancer (HCC)    MELANOMA (NOSE, FACE AND HANDS), BASAL CELL  . Cervicalgia   . Depression   . Diabetes mellitus without complication ([place])   . Difficult intubation    damage to throat  . Diverticulitis   . Dyspnea   . GERD (gastroesophageal reflux disease)   . Headache   . Hernia of abdominal cavity   . History of hiatal hernia   . Hyperlipidemia   . Hypertension   . Sleep apnea    NO CPAP-PT DENIES SLEEP APNEA BUT STUDY WAS DONE AND PT WAS TOLD HE HAD SLEEP APNEA   Past Surgical History:  Procedure Laterality Date  . APPENDECTOMY    . [place]    . CARPAL TUNNEL RELEASE Bilateral   . CATARACT EXTRACTION W/PHACO Right 04/03/2016   Procedure:  CATARACT EXTRACTION PHACO AND INTRAOCULAR LENS PLACEMENT (IOC);  Surgeon: Estill Cotta, MD;  Location: [facility] ORS;  Service: Ophthalmology;  Laterality: Right;  [place] 1.37 AP% 25.8 CDE 44.67 Fluid pack lot # [id number]  . CATARACT EXTRACTION W/PHACO Left 05/29/2016   Procedure: CATARACT EXTRACTION PHACO AND INTRAOCULAR LENS PLACEMENT (IOC);  Surgeon: Estill Cotta, MD;  Location: [facility] ORS;  Service: Ophthalmology;  Laterality: Left;  [place] 01:16 AP% 23.3 CDE 31.18 Fluid pack lot # [id number]  . COLONOSCOPY    . COLONOSCOPY WITH PROPOFOL N/A 01/15/2017   Procedure: COLONOSCOPY WITH PROPOFOL;  Surgeon: Manya Silvas, MD;  Location: [facility] ENDOSCOPY;  Service: Endoscopy;  Laterality: N/A;  . ESOPHAGOGASTRODUODENOSCOPY    . ESOPHAGOGASTRODUODENOSCOPY (EGD) WITH PROPOFOL N/A 01/15/2017   Procedure: ESOPHAGOGASTRODUODENOSCOPY (EGD) WITH PROPOFOL;  Surgeon: Manya Silvas, MD;  Location: [facility] ENDOSCOPY;  Service: Endoscopy;  Laterality: N/A;  . INGUINAL HERNIA REPAIR Right 05/19/2019   Procedure: HERNIA REPAIR INGUINAL ADULT;  Surgeon: Robert Bellow, MD;  Location: [facility] ORS;  Service: General;  Laterality: Right;  . KNEE ARTHROSCOPY Left 08/30/2015   Procedure: ARTHROSCOPY KNEE LEFT, partial medial menisectomy, chondral debridement;  Surgeon: Leanor Kail, MD;  Location: [facility] ORS;  Service:  Orthopedics;  Laterality: Left;  . LEFT HEART CATH AND CORONARY ANGIOGRAPHY Left 11/24/2017   Procedure: LEFT HEART CATH AND CORONARY ANGIOGRAPHY;  Surgeon: Corey Skains, MD;  Location: [facility] CV LAB;  Service: Cardiovascular;  Laterality: Left;  . MOHS SURGERY    . POLYPECTOMY    . ROTATOR CUFF REPAIR Right   . TRIGGER FINGER RELEASE     Family History  Problem Relation Age of Onset  . Pancreatic cancer Mother   . Heart attack Father    Social History   Tobacco Use  . Smoking status: Former Smoker    Packs/day: 0.50    Years: 13.00    Pack years: 6.50    Types: Cigarettes    Quit  date: 08/28/1975    Years since quitting: 44.5  . Smokeless tobacco: Never Used  Vaping Use  . Vaping Use: Never used  Substance Use Topics  . 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 Drug use: No    Pertinent Clinical Results:  LABS: Labs reviewed: Acceptable for surgery.  No visits with results within 3 Day(s) from this visit.  Latest known visit with results is:  Hospital Outpatient Visit on 02/22/2020  Component Date Value Ref Range Status  . MRSA, PCR 02/22/2020 POSITIVE* NEGATIVE Final   Comment: RESULT CALLED TO, READ BACK BY AND VERIFIED WITH: HEATHER DAVIS 02/22/20 1435 KLW   . Staphylococcus aureus 02/22/2020 POSITIVE* NEGATIVE Final   Comment: RESULT CALLED TO, READ BACK BY AND VERIFIED WITH: HEATHER DAVIS 02/22/20 1435 KLW (NOTE) The Xpert SA Assay (FDA approved for NASAL specimens in patients 59 years of age and older), is one component of a comprehensive surveillance program. It is not intended to diagnose infection nor to guide or monitor treatment. Performed at [facility], [address], [place], [place] [address]   . ABO/RH(D) 02/22/2020 O POS   Final  . Antibody Screen 02/22/2020 NEG   Final  . Sample Expiration 02/22/2020 03/07/2020,2359   Final  . Extend sample reason 02/22/2020    Final                   Value:NO TRANSFUSIONS OR PREGNANCY IN THE PAST 3 MONTHS Performed at [facility], [address] [place], [place] [address]   . aPTT 02/22/2020 32  24 - 36 seconds Final   Performed at [facility], [address] [place], [place] [address]  . Sodium 02/22/2020 135  135 - 145 mmol/L Final  . Potassium 02/22/2020 4.3  3.5 - 5.1 mmol/L Final  . Chloride 02/22/2020 98  98 - 111 mmol/L Final  . CO2 02/22/2020 27  22 - 32 mmol/L Final  . Glucose, Bld 02/22/2020 131* 70 - 99 mg/dL Final   Glucose reference range applies only to samples taken after fasting for at least 8 hours.   . BUN 02/22/2020 20  8 - 23 mg/dL Final  . Creatinine, Ser 02/22/2020 1.08  0.61 - 1.24 mg/dL Final  . Calcium 02/22/2020 9.6  8.9 - 10.3 mg/dL Final  . GFR, Estimated 02/22/2020 >60  >60 mL/min Final  . Anion gap 02/22/2020 10  5 - 15 Final   Performed at [facility], [address], [place], [place] [address]  . WBC 02/22/2020 5.6  4.0 - 10.5 K/uL Final  . RBC 02/22/2020 4.09* 4.22 - 5.81 MIL/uL Final  . Hemoglobin 02/22/2020 12.6* 13.0 - 17.0  g/dL Final  . HCT 02/22/2020 37.3* 39 - 52 % Final  . MCV 02/22/2020 91.2  80.0 - 100.0 fL Final  . MCH 02/22/2020 30.8  26.0 - 34.0 pg Final  . MCHC 02/22/2020 33.8  30.0 - 36.0 g/dL Final  . RDW 02/22/2020 13.2  11.5 - 15.5 % Final  . Platelets 02/22/2020 253  150 - 400 K/uL Final  . nRBC 02/22/2020 0.0  0.0 - 0.2 % Final   Performed at [facility], [address], [place], [place] [address]  . Color, Urine 02/22/2020 YELLOW* YELLOW Final  . APPearance 02/22/2020 CLEAR* CLEAR Final  . Specific Gravity, Urine 02/22/2020 1.013  1.005 - 1.030 Final  . pH 02/22/2020 5.0  5.0 - 8.0 Final  . Glucose, UA 02/22/2020 NEGATIVE  NEGATIVE mg/dL Final  . Hgb urine dipstick 02/22/2020 NEGATIVE  NEGATIVE Final  . Bilirubin Urine 02/22/2020 NEGATIVE  NEGATIVE Final  . Ketones, ur 02/22/2020 NEGATIVE  NEGATIVE mg/dL Final  . Protein, ur 02/22/2020 NEGATIVE  NEGATIVE mg/dL Final  . Nitrite 02/22/2020 NEGATIVE  NEGATIVE Final  . Chalmers Guest 02/22/2020 NEGATIVE  NEGATIVE Final   Performed at [facility], [address] [place],  [address]    ECG: Date: 02/17/2020 Rate: 69 bpm Rhythm: normal sinus; IRBBB Axis (leads I and aVF): Normal Intervals: PR 200 ms. QRS 100 ms. QTc 422 ms. ST segment and T wave changes: No evidence of acute ST segment elevation or depression Comparison: Similar to previous tracing obtained on 05/17/2019 NOTE: Tracing obtained at [facility]; unable for review. Above based on  cardiologist's interpretation.    IMAGING / PROCEDURES: LEFT HEART CATHETERIZATION done on 11/24/2017 1. Mild 2 vessel coronary artery disease   Mid LM lesion is 40% stenosed.  Mid LM to Ost LAD lesion is 40% stenosed with 40% stenosed side branch in Ost Cx.  Prox LAD lesion is 35% stenosed.  Ost Cx to Prox Cx lesion is 25% stenosed. 2. Continue medical management of CAD risk factors, Additional medications for management of angina. 3. No further cardiac intervention at this time  ECHOCARDIOGRAM STRESS TEST done on 11/19/2017 1. Abnormal stress echocardiogram 2. LVEF >55% 3. Normal RV systolic function 4. Trivial AR, MR, PR; mild TR 5. No valvular stenosis 6. Inferolateral hypokinesis post exercise   Impression and Plan:  Connor Hall has been referred for pre-anesthesia review and clearance prior to him undergoing the planned anesthetic and procedural courses. Available labs, pertinent testing, and imaging results were personally reviewed by me. This patient has been appropriately cleared by cardiology.   Based on clinical review performed today (02/25/20), barring any significant acute changes in the patient's overall condition, it is anticipated that he will be able to proceed with the planned surgical intervention. Any acute changes in clinical condition may necessitate his procedure being postponed and/or cancelled. Pre-surgical instructions were reviewed with the patient during his PAT appointment and questions were fielded by PAT clinical staff.  Honor Loh, MSN, APRN, FNP-C, CEN [facility]  Peri-operative Services Nurse Practitioner Phone: [phone] 02/25/20 12:10 PM  NOTE: This note has been prepared using Dragon dictation software. Despite my best ability to proofread, there is always the potential that unintentional transcriptional errors may still occur from this process.

## 2020-03-02 ENCOUNTER — Other Ambulatory Visit: Payer: Medicare HMO

## 2020-03-06 ENCOUNTER — Inpatient Hospital Stay: Admission: RE | Admit: 2020-03-06 | Payer: Medicare HMO | Source: Ambulatory Visit

## 2020-03-07 MED ORDER — VANCOMYCIN HCL IN DEXTROSE 1-5 GM/200ML-% IV SOLN: 1000.0000 mg | INTRAVENOUS | Status: AC

## 2020-03-07 MED ORDER — SODIUM CHLORIDE 0.9 % IV SOLN: INTRAVENOUS | Status: DC

## 2020-03-07 MED ORDER — TRANEXAMIC ACID-NACL 1000-0.7 MG/100ML-% IV SOLN: 1000.0000 mg | INTRAVENOUS | Status: AC

## 2020-03-07 MED ORDER — CLINDAMYCIN PHOSPHATE 900 MG/50ML IV SOLN: 900.0000 mg | INTRAVENOUS | Status: AC

## 2020-03-07 MED ORDER — LACTATED RINGERS IV SOLN: INTRAVENOUS | Status: DC

## 2020-03-07 MED ORDER — CHLORHEXIDINE GLUCONATE 0.12 % MT SOLN: 15.0000 mL | Freq: Once | OROMUCOSAL | Status: DC

## 2020-03-07 MED ORDER — ORAL CARE MOUTH RINSE: 15.0000 mL | Freq: Once | OROMUCOSAL | Status: DC

## 2020-03-08 ENCOUNTER — Ambulatory Visit: Admission: RE | Admit: 2020-03-08 | Payer: Medicare HMO | Source: Home / Self Care | Admitting: Orthopedic Surgery

## 2020-03-08 HISTORY — DX: Failed or difficult intubation, initial encounter: T88.4XXA

## 2020-03-08 SURGERY — ARTHROPLASTY, KNEE, TOTAL
Anesthesia: Spinal | Site: Knee | Laterality: Right

## 2020-03-19 ENCOUNTER — Other Ambulatory Visit: Payer: Self-pay

## 2020-03-19 ENCOUNTER — Observation Stay
Admission: EM | Admit: 2020-03-19 | Discharge: 2020-03-20 | Disposition: A | Discharge status: Home / Self Care | Payer: Medicare HMO | Attending: Obstetrics and Gynecology | Admitting: Obstetrics and Gynecology

## 2020-03-19 ENCOUNTER — Emergency Department: Payer: Medicare HMO

## 2020-03-19 ENCOUNTER — Observation Stay: Payer: Medicare HMO

## 2020-03-19 ENCOUNTER — Encounter: Payer: Self-pay | Admitting: Internal Medicine

## 2020-03-19 DIAGNOSIS — Z79899 Other long term (current) drug therapy: Secondary | ICD-10-CM | POA: Insufficient documentation

## 2020-03-19 DIAGNOSIS — D649 Anemia, unspecified: Secondary | ICD-10-CM | POA: Diagnosis present

## 2020-03-19 DIAGNOSIS — E119 Type 2 diabetes mellitus without complications: Secondary | ICD-10-CM | POA: Insufficient documentation

## 2020-03-19 DIAGNOSIS — R42 Dizziness and giddiness: Secondary | ICD-10-CM | POA: Diagnosis present

## 2020-03-19 DIAGNOSIS — Z959 Presence of cardiac and vascular implant and graft, unspecified: Secondary | ICD-10-CM | POA: Diagnosis not present

## 2020-03-19 DIAGNOSIS — I10 Essential (primary) hypertension: Secondary | ICD-10-CM | POA: Diagnosis present

## 2020-03-19 DIAGNOSIS — K219 Gastro-esophageal reflux disease without esophagitis: Secondary | ICD-10-CM | POA: Insufficient documentation

## 2020-03-19 DIAGNOSIS — G459 Transient cerebral ischemic attack, unspecified: Principal | ICD-10-CM | POA: Diagnosis present

## 2020-03-19 DIAGNOSIS — J45909 Unspecified asthma, uncomplicated: Secondary | ICD-10-CM | POA: Diagnosis not present

## 2020-03-19 DIAGNOSIS — Z87891 Personal history of nicotine dependence: Secondary | ICD-10-CM | POA: Insufficient documentation

## 2020-03-19 DIAGNOSIS — D509 Iron deficiency anemia, unspecified: Secondary | ICD-10-CM | POA: Diagnosis present

## 2020-03-19 DIAGNOSIS — E1159 Type 2 diabetes mellitus with other circulatory complications: Secondary | ICD-10-CM | POA: Diagnosis not present

## 2020-03-19 DIAGNOSIS — Z7984 Long term (current) use of oral hypoglycemic drugs: Secondary | ICD-10-CM | POA: Diagnosis not present

## 2020-03-19 DIAGNOSIS — I2510 Atherosclerotic heart disease of native coronary artery without angina pectoris: Secondary | ICD-10-CM | POA: Diagnosis not present

## 2020-03-19 DIAGNOSIS — G4730 Sleep apnea, unspecified: Secondary | ICD-10-CM | POA: Insufficient documentation

## 2020-03-19 DIAGNOSIS — R4701 Aphasia: Secondary | ICD-10-CM | POA: Diagnosis present

## 2020-03-19 DIAGNOSIS — Z20822 Contact with and (suspected) exposure to covid-19: Secondary | ICD-10-CM | POA: Insufficient documentation

## 2020-03-19 DIAGNOSIS — Z85828 Personal history of other malignant neoplasm of skin: Secondary | ICD-10-CM | POA: Diagnosis not present

## 2020-03-19 HISTORY — DX: Transient cerebral ischemic attack, unspecified: G45.9

## 2020-03-19 LAB — CBC
HCT: 36.7 % — ABNORMAL LOW (ref 39.0–52.0)
Hemoglobin: 12.5 g/dL — ABNORMAL LOW (ref 13.0–17.0)
MCH: 31.2 pg (ref 26.0–34.0)
MCHC: 34.1 g/dL (ref 30.0–36.0)
MCV: 91.5 fL (ref 80.0–100.0)
Platelets: 254 10*3/uL (ref 150–400)
RBC: 4.01 MIL/uL — ABNORMAL LOW (ref 4.22–5.81)
RDW: 13.1 % (ref 11.5–15.5)
WBC: 5.7 10*3/uL (ref 4.0–10.5)
nRBC: 0 % (ref 0.0–0.2)

## 2020-03-19 LAB — FERRITIN: Ferritin: 86 ng/mL (ref 24–336)

## 2020-03-19 LAB — T4, FREE: Free T4: 1.17 ng/dL — ABNORMAL HIGH (ref 0.61–1.12)

## 2020-03-19 LAB — COMPREHENSIVE METABOLIC PANEL
ALT: 20 U/L (ref 0–44)
AST: 23 U/L (ref 15–41)
Albumin: 4.7 g/dL (ref 3.5–5.0)
Alkaline Phosphatase: 42 U/L (ref 38–126)
Anion gap: 12 (ref 5–15)
BUN: 17 mg/dL (ref 8–23)
CO2: 24 mmol/L (ref 22–32)
Calcium: 9.5 mg/dL (ref 8.9–10.3)
Chloride: 96 mmol/L — ABNORMAL LOW (ref 98–111)
Creatinine, Ser: 1.22 mg/dL (ref 0.61–1.24)
GFR, Estimated: >60 mL/min (ref >60)
Glucose, Bld: 218 mg/dL — ABNORMAL HIGH (ref 70–99)
Potassium: 4.1 mmol/L (ref 3.5–5.1)
Sodium: 132 mmol/L — ABNORMAL LOW (ref 135–145)
Total Bilirubin: 0.9 mg/dL (ref 0.3–1.2)
Total Protein: 7.7 g/dL (ref 6.5–8.1)

## 2020-03-19 LAB — DIFFERENTIAL
Abs Immature Granulocytes: 0.02 10*3/uL (ref 0.00–0.07)
Basophils Absolute: 0 10*3/uL (ref 0.0–0.1)
Basophils Relative: 1 %
Eosinophils Absolute: 0.1 10*3/uL (ref 0.0–0.5)
Eosinophils Relative: 2 %
Immature Granulocytes: 0 %
Lymphocytes Relative: 23 %
Lymphs Abs: 1.3 10*3/uL (ref 0.7–4.0)
Monocytes Absolute: 0.7 10*3/uL (ref 0.1–1.0)
Monocytes Relative: 12 %
Neutro Abs: 3.5 10*3/uL (ref 1.7–7.7)
Neutrophils Relative %: 62 %

## 2020-03-19 LAB — IRON AND TIBC
Iron: 88 ug/dL (ref 45–182)
Saturation Ratios: 23 % (ref 17.9–39.5)
TIBC: 379 ug/dL (ref 250–450)
UIBC: 291 ug/dL

## 2020-03-19 LAB — PROTIME-INR
INR: 0.9 (ref 0.8–1.2)
Prothrombin Time: 12 seconds (ref 11.4–15.2)

## 2020-03-19 LAB — TROPONIN I (HIGH SENSITIVITY)
Troponin I (High Sensitivity): 9 ng/L (ref <18)
Troponin I (High Sensitivity): 8 ng/L (ref <18)
Troponin I (High Sensitivity): 9 ng/L (ref <18)

## 2020-03-19 LAB — RETICULOCYTES
Immature Retic Fract: 9 % (ref 2.3–15.9)
RBC.: 4.03 MIL/uL — ABNORMAL LOW (ref 4.22–5.81)
Retic Count, Absolute: 66.5 10*3/uL (ref 19.0–186.0)
Retic Ct Pct: 1.7 % (ref 0.4–3.1)

## 2020-03-19 LAB — CBG MONITORING, ED: Glucose-Capillary: 194 mg/dL — ABNORMAL HIGH (ref 70–99)

## 2020-03-19 LAB — APTT: aPTT: 33 seconds (ref 24–36)

## 2020-03-19 LAB — RESPIRATORY PANEL BY RT PCR (FLU A&B, COVID)
Influenza A by PCR: NEGATIVE
Influenza B by PCR: NEGATIVE
SARS Coronavirus 2 by RT PCR: NEGATIVE

## 2020-03-19 LAB — FOLATE: Folate: 35 ng/mL (ref >5.9)

## 2020-03-19 LAB — TSH: TSH: 2.1 u[IU]/mL (ref 0.350–4.500)

## 2020-03-19 MED ORDER — SODIUM CHLORIDE 0.9% FLUSH
3.0000 mL | Freq: Once | INTRAVENOUS | Status: AC
Start: 2020-03-19 — End: 2020-03-19
  Administered 2020-03-19: 3 mL via INTRAVENOUS

## 2020-03-19 MED ORDER — ACETAMINOPHEN 160 MG/5ML PO SOLN
650.0000 mg | ORAL | Status: DC | PRN
  Filled 2020-03-19: qty 20.3

## 2020-03-19 MED ORDER — ACETAMINOPHEN 325 MG PO TABS: 650.0000 mg | ORAL_TABLET | ORAL | Status: DC | PRN

## 2020-03-19 MED ORDER — ACETAMINOPHEN 650 MG RE SUPP: 650.0000 mg | RECTAL | Status: DC | PRN

## 2020-03-19 MED ORDER — HEPARIN SODIUM (PORCINE) 5000 UNIT/ML IJ SOLN
5000.0000 [IU] | Freq: Three times a day (TID) | INTRAMUSCULAR | Status: DC
  Administered 2020-03-19 – 2020-03-20 (×2): 5000 [IU] via SUBCUTANEOUS
  Filled 2020-03-19 (×2): qty 1

## 2020-03-19 MED ORDER — STROKE: EARLY STAGES OF RECOVERY BOOK: Freq: Once | Status: DC

## 2020-03-19 MED ORDER — INSULIN ASPART 100 UNIT/ML ~~LOC~~ SOLN
0.0000 [IU] | Freq: Three times a day (TID) | SUBCUTANEOUS | Status: DC
  Administered 2020-03-20: 2 [IU] via SUBCUTANEOUS
  Administered 2020-03-20: 3 [IU] via SUBCUTANEOUS
  Filled 2020-03-19 (×2): qty 1

## 2020-03-19 MED ORDER — SODIUM CHLORIDE 0.9 % IV SOLN: INTRAVENOUS | Status: DC

## 2020-03-19 NOTE — ED Triage Notes (Signed)
Pt presents via POV c/o "light headedness" x2 weeks. Reports episode of expressive aphasia x45 mins ago. Reports constipation as well. Pt reports improvement of speech currently.

## 2020-03-19 NOTE — Assessment & Plan Note (Signed)
Pt Is allergic to aspirin and consider Plavix 75 mg daily.  Pt is to continue Pravachol at 80 mg although I suspect it is suboptimal and so we will change to crestor / obtain am lipid panel.  Pt has nonobstructive CAD and needs medical optimization with medication and lifestyle.

## 2020-03-19 NOTE — ED Notes (Signed)
Pt in room from CT. EDP at bedside.

## 2020-03-19 NOTE — Assessment & Plan Note (Signed)
Ssi/a1c/glycemix protocol. Home regimen of metformin and glipizide held.

## 2020-03-19 NOTE — ED Notes (Signed)
Pt TV turned on and visitor policy explained

## 2020-03-19 NOTE — ED Notes (Signed)
Reports some "head pressure"

## 2020-03-19 NOTE — ED Notes (Signed)
Pt states had 3 minute or so episode at 1600 today where he could not formulate words correctly. Dysarthria and slurred speech. Head felt "fuzzy". Pt has clear speech now.

## 2020-03-19 NOTE — ED Notes (Signed)
Attending at bedside. Wife at bedside.

## 2020-03-19 NOTE — H&P (Signed)
History and Physical    PHI AVANS KDX:[id number] DOB: 1941/01/10 DOA: 03/19/2020  PCP: Baxter Hire, MD    Patient coming from:  Home   Chief Complaint:  Aphasia.   HPI: Connor Hall is a 78 y.o. male with medical history significant of htn,dmii, nonobstructive cad 2019, seen in ed for transient episode of aphasia that resolved after five minutes. Pt reports that it has never happened before. Pt also reports that he saw his urgent care md for rectal bleeding recently and was told it was hemorrhoidal.      ED Course:  Vitals:   03/19/20 1707 03/19/20 1712 03/19/20 1713 03/19/20 1730  BP: (!) 162/82   (!) 143/75  Pulse:   75 69  Resp:  12 19 12   Temp:      TempSrc:      SpO2:   99% 98%      Review of Systems:  Review of Systems  Neurological: Positive for dizziness and speech change.  All other systems reviewed and are negative.    Past Medical History:  Diagnosis Date  . Anemia   . Anxiety   . Arthritis   . Asthma    WELL CONTROLLED  . Blockage of coronary artery of heart (HCC)    MANAGING WITH MEDICINE-NO STENTS  . Cancer (HCC)    MELANOMA (NOSE, FACE AND HANDS), BASAL CELL  . Cervicalgia   . Depression   . Diabetes mellitus without complication ([place])   . Difficult intubation    damage to throat  . Diverticulitis   . Dyspnea   . GERD (gastroesophageal reflux disease)   . Headache   . Hernia of abdominal cavity   . History of hiatal hernia   . Hyperlipidemia   . Hypertension   . Sleep apnea    NO CPAP-PT DENIES SLEEP APNEA BUT STUDY WAS DONE AND PT WAS TOLD HE HAD SLEEP APNEA    Past Surgical History:  Procedure Laterality Date  . APPENDECTOMY    . [place]    . CARPAL TUNNEL RELEASE Bilateral   . CATARACT EXTRACTION W/PHACO Right 04/03/2016   Procedure: CATARACT EXTRACTION PHACO AND INTRAOCULAR LENS PLACEMENT (IOC);  Surgeon: Estill Cotta, MD;  Location: [facility] ORS;   Service: Ophthalmology;  Laterality: Right;  [place] 1.37 AP% 25.8 CDE 44.67 Fluid pack lot # [id number]  . CATARACT EXTRACTION W/PHACO Left 05/29/2016   Procedure: CATARACT EXTRACTION PHACO AND INTRAOCULAR LENS PLACEMENT (IOC);  Surgeon: Estill Cotta, MD;  Location: [facility] ORS;  Service: Ophthalmology;  Laterality: Left;  [place] 01:16 AP% 23.3 CDE 31.18 Fluid pack lot # [id number]  . COLONOSCOPY    . COLONOSCOPY WITH PROPOFOL N/A 01/15/2017   Procedure: COLONOSCOPY WITH PROPOFOL;  Surgeon: Manya Silvas, MD;  Location: [facility] ENDOSCOPY;  Service: Endoscopy;  Laterality: N/A;  . ESOPHAGOGASTRODUODENOSCOPY    . ESOPHAGOGASTRODUODENOSCOPY (EGD) WITH PROPOFOL N/A 01/15/2017   Procedure: ESOPHAGOGASTRODUODENOSCOPY (EGD) WITH PROPOFOL;  Surgeon: Manya Silvas, MD;  Location: [facility] ENDOSCOPY;  Service: Endoscopy;  Laterality: N/A;  . INGUINAL HERNIA REPAIR Right 05/19/2019   Procedure: HERNIA REPAIR INGUINAL ADULT;  Surgeon: Robert Bellow, MD;  Location: [facility] ORS;  Service: General;  Laterality: Right;  . KNEE ARTHROSCOPY Left 08/30/2015   Procedure: ARTHROSCOPY KNEE LEFT, partial medial menisectomy, chondral debridement;  Surgeon: Leanor Kail, MD;  Location: [facility] ORS;  Service: Orthopedics;  Laterality: Left;  . LEFT HEART CATH AND CORONARY ANGIOGRAPHY  Left 11/24/2017   Procedure: LEFT HEART CATH AND CORONARY ANGIOGRAPHY;  Surgeon: Corey Skains, MD;  Location: [place] CV LAB;  Service: Cardiovascular;  Laterality: Left;  . MOHS SURGERY    . POLYPECTOMY    . ROTATOR CUFF REPAIR Right   . TRIGGER FINGER RELEASE       reports that he quit smoking about 44 years ago. His smoking use included cigarettes. He has a 6.50 pack-year smoking history. He has never used smokeless tobacco. He reports previous alcohol use. He reports that he does not use drugs.  Allergies  Allergen Reactions  . Ace Inhibitors Swelling    Face and lips  . Penicillins Other (See Comments)    CHILDHOOD ALLERGY    Has patient had a PCN reaction causing immediate rash, facial/tongue/throat swelling, SOB or lightheadedness with hypotension: Unknown Has patient had a PCN reaction causing severe rash involving mucus membranes or skin necrosis: Unknown Has patient had a PCN reaction that required hospitalization: No Has patient had a PCN reaction occurring within the last 10 years: No If all of the above answers are "NO", then may proceed with Cephalosporin use.    . Aspirin Rash  . Other Palpitations    MSG    Family History  Problem Relation Age of Onset  . Pancreatic cancer Mother   . Heart attack Father     Prior to Admission medications   Medication Sig Start Date End Date Taking? Authorizing Provider  acetaminophen (TYLENOL) 650 MG CR tablet Take 650 mg by mouth every 8 (eight) hours as needed for pain.   Yes [provider]  amLODipine (NORVASC) 10 MG tablet Take 10 mg by mouth every morning.    Yes [provider]  cyanocobalamin 1000 MCG tablet Take 1,000 mcg by mouth daily.   Yes [provider]  docusate sodium (COLACE) 100 MG capsule Take 100 mg by mouth 2 (two) times daily as needed for mild constipation.   Yes [provider]  ferrous sulfate 325 (65 FE) MG tablet Take 325 mg by mouth daily with breakfast.   Yes [provider]  glipiZIDE (GLUCOTROL XL) 10 MG 24 hr tablet Take 10 mg by mouth daily with breakfast.   Yes [provider]  hydrochlorothiazide (HYDRODIURIL) 25 MG tablet Take 25 mg by mouth daily.   Yes [provider]  levofloxacin (LEVAQUIN) 500 MG tablet Take 500 mg by mouth daily. 03/17/20 03/22/20 Yes [provider]  Melatonin 10 MG TABS Take 10 mg by mouth at bedtime as needed (sleep).   Yes [provider]  meloxicam (MOBIC) 7.5 MG tablet Take 7.5 mg by mouth in the morning and at bedtime.    Yes [provider]  metFORMIN (GLUCOPHAGE) 1000 MG tablet Take 1,000 mg by mouth 2  (two) times daily with a meal.   Yes [provider]  mometasone (NASONEX) 50 MCG/ACT nasal spray Place 2 sprays into the nose daily.   Yes [provider]  Multiple Vitamins-Minerals (CENTRUM SILVER ULTRA MENS) TABS Take 1 tablet by mouth daily.   Yes [provider]  omeprazole (PRILOSEC) 20 MG capsule Take 20 mg by mouth every morning.   Yes [provider]  polyethylene glycol (MIRALAX / GLYCOLAX) 17 g packet Take 17 g by mouth daily as needed for moderate constipation.   Yes [provider]  pravastatin (PRAVACHOL) 80 MG tablet Take 80 mg by mouth at bedtime.   Yes [provider]  pyridOXINE (  B-6) 50 MG tablet Take 50 mg by mouth daily.   Yes [provider]  Salicylic Acid-Urea (KERASAL EX) Apply 1 application topically daily as needed (toe irritation).   Yes [provider]  traMADol (ULTRAM) 50 MG tablet Take 50 mg by mouth every 6 (six) hours as needed for moderate pain.    Yes [provider]    Physical Exam: Vitals:   03/19/20 1707 03/19/20 1712 03/19/20 1713 03/19/20 1730  BP: (!) 162/82   (!) 143/75  Pulse:   75 69  Resp:  12 19 12   Temp:      TempSrc:      SpO2:   99% 98%    Physical Exam Vitals and nursing note reviewed.  Constitutional:      Appearance: Normal appearance.  HENT:     Head: Normocephalic and atraumatic.     Right Ear: External ear normal.     Left Ear: External ear normal.     Nose: Nose normal.     Mouth/Throat:     Mouth: Mucous membranes are moist.  Eyes:     Extraocular Movements: Extraocular movements intact.     Pupils: Pupils are equal, round, and reactive to light.  Cardiovascular:     Rate and Rhythm: Normal rate and regular rhythm.     Pulses: Normal pulses.     Heart sounds: Normal heart sounds.  Pulmonary:     Effort: Pulmonary effort is normal.     Breath sounds: Normal breath sounds.  Abdominal:     General: Bowel sounds are normal. There is no  distension.     Palpations: Abdomen is soft. There is no mass.     Tenderness: There is no abdominal tenderness. There is no right CVA tenderness, left CVA tenderness or guarding.  Musculoskeletal:        General: Normal range of motion.     Right lower leg: No edema.     Left lower leg: No edema.  Skin:    General: Skin is warm.  Neurological:     General: No focal deficit present.     Mental Status: He is alert and oriented to person, place, and time.  Psychiatric:        Mood and Affect: Mood normal.        Behavior: Behavior normal.      Labs on Admission: I have personally reviewed following labs and imaging studies  CBC: Recent Labs  Lab 03/19/20 1612  WBC 5.7  NEUTROABS 3.5  HGB 12.5*  HCT 36.7*  MCV 91.5  PLT 939   Basic Metabolic Panel: Recent Labs  Lab 03/19/20 1612  NA 132*  K 4.1  CL 96*  CO2 24  GLUCOSE 218*  BUN 17  CREATININE 1.22  CALCIUM 9.5   GFR: CrCl cannot be calculated (Unknown ideal weight.). Liver Function Tests: Recent Labs  Lab 03/19/20 1612  AST 23  ALT 20  ALKPHOS 42  BILITOT 0.9  PROT 7.7  ALBUMIN 4.7   No results for input(s): LIPASE, AMYLASE in the last 168 hours. No results for input(s): AMMONIA in the last 168 hours. Coagulation Profile: Recent Labs  Lab 03/19/20 1612  INR 0.9   Cardiac Enzymes: No results for input(s): CKTOTAL, CKMB, CKMBINDEX, TROPONINI in the last 168 hours. BNP (last 3 results) No results for input(s): PROBNP in the last 8760 hours. HbA1C: No results for input(s): HGBA1C in the last 72 hours. CBG: Recent Labs  Lab 03/19/20 1709  GLUCAP 194*   Lipid Profile: No results for input(s): CHOL, HDL, LDLCALC, TRIG, CHOLHDL, LDLDIRECT in the last 72 hours. Thyroid Function Tests: No results for input(s): TSH, T4TOTAL, FREET4, T3FREE, THYROIDAB in the last 72 hours. Anemia Panel: Recent Labs    03/19/20 1612  RETICCTPCT 1.7   Urine analysis:    Component Value Date/Time   COLORURINE  YELLOW (A) 02/22/2020 1018   APPEARANCEUR CLEAR (A) 02/22/2020 1018   LABSPEC 1.013 02/22/2020 1018   PHURINE 5.0 02/22/2020 1018   GLUCOSEU NEGATIVE 02/22/2020 [place] 02/22/2020 [place] 02/22/2020 1018   KETONESUR NEGATIVE 02/22/2020 1018   PROTEINUR NEGATIVE 02/22/2020 1018   NITRITE NEGATIVE 02/22/2020 1018   LEUKOCYTESUR NEGATIVE 02/22/2020 1018   No intake or output data in the 24 hours ending 03/19/20 1909 Lab Results  Component Value Date   CREATININE 1.22 03/19/2020   CREATININE 1.08 02/22/2020   CREATININE 1.04 05/17/2019    COVID-19 Labs  No results for input(s): DDIMER, FERRITIN, LDH, CRP in the last 72 hours.  Lab Results  Component Value Date   [place] NEGATIVE 05/17/2019    Radiological Exams on Admission: CT HEAD WO CONTRAST  Result Date: 03/19/2020 CLINICAL DATA:  Lightheadedness for 2 weeks. Episode of expressive aphasia today. EXAM: CT HEAD WITHOUT CONTRAST TECHNIQUE: Contiguous axial images were obtained from the base of the skull through the vertex without intravenous contrast. COMPARISON:  Head MRI 02/06/2009 FINDINGS: Brain: There is no evidence of an acute infarct, intracranial hemorrhage, mass, midline shift, or extra-axial fluid collection. Patchy T2 hyperintensities in the cerebral white matter bilaterally are nonspecific but compatible with mild-to-moderate chronic small vessel ischemic disease, progressed from the prior MRI. The ventricles and sulci are within normal limits for age. Vascular: Calcified atherosclerosis at the skull base. No hyperdense vessel. Skull: No fracture or suspicious osseous lesion. Sinuses/Orbits: Visualized paranasal sinuses and mastoid air cells are clear. Visualized orbits are unremarkable. Other: None. IMPRESSION: 1. No evidence of acute intracranial abnormality. 2. Mild-to-moderate chronic small vessel ischemic disease. Electronically Signed   By: Logan Bores M.D.   On: 03/19/2020 17:34     DG Chest Portable 1 View  Result Date: 03/19/2020 CLINICAL DATA:  Acute onset expressive aphasia 1 hour ago dizziness. EXAM: PORTABLE CHEST 1 VIEW COMPARISON:  None. FINDINGS: The heart size and mediastinal contours are within normal limits. Aortic atherosclerotic calcification noted. Both lungs are clear. Pulmonary hyperinflation is seen, suspicious for COPD. 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  No active cardiopulmonary disease. Electronically Signed   By: Marlaine Hind M.D.   On: 03/19/2020 17:56    EKG: Independently reviewed.  Sinus rhythm 79 and 1 st deg av block .    Assessment/Plan Principal Problem:   TIA (transient ischemic attack) Active Problems:   Anemia   Coronary artery disease involving native coronary artery of native heart   Diabetes mellitus type 2, uncomplicated ([place])   Hypertension Hypertension Assessment & Plan Pt is on amlodipine 10 and hctz 25mg .  He is allergic to ACEI .  Coronary artery disease involving native coronary artery of native heart Assessment & Plan Pt Is allergic to aspirin and consider Plavix 75 mg daily.  Pt is to continue Pravachol at 80 mg although I suspect it is suboptimal and so we will change to crestor / obtain am lipid panel.  Pt has nonobstructive CAD and needs medical optimization with medication and lifestyle.   TIA (transient ischemic attack) Assessment & Plan Pt presents with transient  episode of aphasia lasting few minutes and resolving quickly and both wife and daughter were there with patient.  We will admit in med tele floor and pursue ischemic cva workup and also orthostatic BP and anemia workup.   Diabetes mellitus type 2, uncomplicated [facility]) Assessment & Plan Ssi/a1c/glycemix protocol. Home regimen of metformin and glipizide held.   Anemia Assessment & Plan Pt has anemia since 2021 and has been on meloxicam for his arthritis.  D/w pt and wife to refrain from NSAID's.  We will start iv ppi. Pt has right sided abdominal  pain since about a month as well.  Pt was told by pcp to take iron supplements.  We will obtain anemia panel/ thyroid studies/viut b12/folate.  Stool occult. Consider GI consult pt is also advised for outpatient GI followup.     DVT prophylaxis:  heparin   Code Status:  Full code   Family Communication:  Wife at bedside   Disposition Plan:  home   Consults called:  none  Admission status: Status is: Observation  The patient remains OBS appropriate and will d/c before 2 midnights.  Dispo: The patient is from: Home              Anticipated d/c is to: Home              Anticipated d/c date is: 2 days              Patient currently is not medically stable to d/c.          Para Skeans MD [facility] [phone] How to contact the [facility] Attending or Consulting provider [place] or covering provider during after hours [place], for this patient?    1. Check the care team in [facility] and look for a) attending/consulting [facility] provider listed and b) the [facility] team listed 2. Log into [email or web address] and use [place]'s universal password to access. If you do not have the password, please contact the hospital operator. 3. Locate the [facility] provider you are looking for under [facility] and page to a number that you can be directly reached. 4. If you still have difficulty reaching the provider, please page the [facility] (Director on Call) for the Hospitalists listed on amion for assistance. [email or web address] Password [email or web address] 03/19/2020, 7:09 PM

## 2020-03-19 NOTE — ED Provider Notes (Signed)
[facility] Emergency Department Provider Note   ____________________________________________   First MD Initiated Contact with Patient 03/19/20 1655     (approximate)  I have reviewed the triage vital signs and the nursing notes.   HISTORY  Chief Complaint Dizziness and Aphasia    HPI Connor Hall is a 79 y.o. male patient had had some lightheadedness earlier but today seem to be doing well had an episode that lasted 5 minutes or less where he could not say words properly could not think of overhead fan for example to talk to his daughter was not making any sense when he spoke.  That resolved quickly.  After that he just says his head did not feel right.  He was not lightheaded or achy just did not feel right.  He came in here to be checked.  He is back to normal now.  He is never had anything like this before.         Past Medical History:  Diagnosis Date  . Anemia   . Anxiety   . Arthritis   . Asthma    WELL CONTROLLED  . Blockage of coronary artery of heart (HCC)    MANAGING WITH MEDICINE-NO STENTS  . Cancer (HCC)    MELANOMA (NOSE, FACE AND HANDS), BASAL CELL  . Cervicalgia   . Depression   . Diabetes mellitus without complication ([place])   . Difficult intubation    damage to throat  . Diverticulitis   . Dyspnea   . GERD (gastroesophageal reflux disease)   . Headache   . Hernia of abdominal cavity   . History of hiatal hernia   . Hyperlipidemia   . Hypertension   . Sleep apnea    NO CPAP-PT DENIES SLEEP APNEA BUT STUDY WAS DONE AND PT WAS TOLD HE HAD SLEEP APNEA    Patient Active Problem List   Diagnosis Date Noted  . Coronary artery disease involving native coronary artery of native heart 12/08/2017  . Angina decubitus ([place]) 11/24/2017  . SOBOE (shortness of breath on exertion) 11/20/2017  . Pain of left hand 08/21/2017  . Carpal tunnel syndrome of left wrist 08/01/2017  . Anemia 10/11/2016  . FH: colon polyps 10/11/2016  .  Primary osteoarthritis of left knee 10/09/2015  . Tear of meniscus of knee 07/26/2015  . Left knee pain 07/24/2015  . Subcoracoid bursitis of right shoulder 12/09/2014  . Rotator cuff tear, non-traumatic, right 07/07/2014  . Impingement syndrome, shoulder, right 03/23/2014  . Malignant neoplasm of skin of nose 02/07/2014  . Asthma without status asthmaticus 11/03/2013  . Diabetes mellitus type 2, uncomplicated ([place]) 65/46/5035  . Hyperlipidemia 11/03/2013  . Hypertension 11/03/2013  . Obesity 11/03/2013    Past Surgical History:  Procedure Laterality Date  . APPENDECTOMY    . [place]    . CARPAL TUNNEL RELEASE Bilateral   . CATARACT EXTRACTION W/PHACO Right 04/03/2016   Procedure: CATARACT EXTRACTION PHACO AND INTRAOCULAR LENS PLACEMENT (IOC);  Surgeon: Estill Cotta, MD;  Location: [facility] ORS;  Service: Ophthalmology;  Laterality: Right;  [place] 1.37 AP% 25.8 CDE 44.67 Fluid pack lot # [id number]  . CATARACT EXTRACTION W/PHACO Left 05/29/2016   Procedure: CATARACT EXTRACTION PHACO AND INTRAOCULAR LENS PLACEMENT (IOC);  Surgeon: Estill Cotta, MD;  Location: [facility] ORS;  Service: Ophthalmology;  Laterality: Left;  [place] 01:16 AP% 23.3 CDE 31.18 Fluid pack lot # [id number]  . COLONOSCOPY    .  COLONOSCOPY WITH PROPOFOL N/A 01/15/2017   Procedure: COLONOSCOPY WITH PROPOFOL;  Surgeon: Manya Silvas, MD;  Location[facility] ENDOSCOPY;  Service: Endoscopy;  Laterality: N/A;  . ESOPHAGOGASTRODUODENOSCOPY    . ESOPHAGOGASTRODUODENOSCOPY (EGD) WITH PROPOFOL N/A 01/15/2017   Procedure: ESOPHAGOGASTRODUODENOSCOPY (EGD) WITH PROPOFOL;  Surgeon: Manya Silvas, MD;  Location: [facility] ENDOSCOPY;  Service: Endoscopy;  Laterality: N/A;  . INGUINAL HERNIA REPAIR Right 05/19/2019   Procedure: HERNIA REPAIR INGUINAL ADULT;  Surgeon: Robert Bellow, MD;  Location: [facility] ORS;  Service: General;  Laterality: Right;  . KNEE ARTHROSCOPY Left 08/30/2015    Procedure: ARTHROSCOPY KNEE LEFT, partial medial menisectomy, chondral debridement;  Surgeon: Leanor Kail, MD;  Location: [facility] ORS;  Service: Orthopedics;  Laterality: Left;  . LEFT HEART CATH AND CORONARY ANGIOGRAPHY Left 11/24/2017   Procedure: LEFT HEART CATH AND CORONARY ANGIOGRAPHY;  Surgeon: Corey Skains, MD;  Location: [place] CV LAB;  Service: Cardiovascular;  Laterality: Left;  . MOHS SURGERY    . POLYPECTOMY    . ROTATOR CUFF REPAIR Right   . TRIGGER FINGER RELEASE      Prior to Admission medications   Medication Sig Start Date End Date Taking? Authorizing Provider  acetaminophen (TYLENOL) 650 MG CR tablet Take 650 mg by mouth every 8 (eight) hours as needed for pain.    [provider]  amLODipine (NORVASC) 10 MG tablet Take 10 mg by mouth every morning.     [provider]  cefdinir (OMNICEF) 300 MG capsule Take 300 mg by mouth 2 (two) times daily.  Patient not taking: Reported on 02/10/2020    [provider]  cyanocobalamin 1000 MCG tablet Take 1,000 mcg by mouth daily.    [provider]  Ferrous Sulfate (IRON PO) Take 1 tablet by mouth daily. Patient not taking: Reported on 02/10/2020    [provider]  ferrous sulfate 325 (65 FE) MG tablet Take 325 mg by mouth daily with breakfast.    [provider]  glipiZIDE (GLUCOTROL XL) 10 MG 24 hr tablet Take 10 mg by mouth daily with breakfast.    [provider]  guaiFENesin (MUCINEX) 600 MG 12 hr tablet Take 600 mg by mouth 2 (two) times daily. Patient not taking: Reported on 02/10/2020    [provider]  hydrochlorothiazide (HYDRODIURIL) 25 MG tablet Take 25 mg by mouth daily.    [provider]  HYDROcodone-acetaminophen (NORCO/VICODIN) 5-325 MG tablet Take 1 tablet by mouth every 4 (four) hours as needed for moderate pain. Patient not taking: Reported on 02/10/2020 05/19/19 05/18/20  Robert Bellow, MD  Melatonin 10 MG TABS Take 10 mg  by mouth at bedtime as needed (sleep).    [provider]  meloxicam (MOBIC) 7.5 MG tablet Take 7.5 mg by mouth daily.     [provider]  metFORMIN (GLUCOPHAGE) 1000 MG tablet Take 1,000 mg by mouth 2 (two) times daily with a meal.    [provider]  mometasone (NASONEX) 50 MCG/ACT nasal spray Place 2 sprays into the nose daily.    [provider]  Multiple Vitamins-Minerals (CENTRUM SILVER ULTRA MENS) TABS Take 1 tablet by mouth daily.    [provider]  omeprazole (PRILOSEC) 20 MG capsule Take 20 mg by mouth every morning.    [provider]  pravastatin (PRAVACHOL) 80 MG tablet Take 80 mg by mouth at bedtime.    [provider]  pyridOXINE (B-6) 50 MG tablet Take 50 mg by mouth daily.  [provider]  Salicylic Acid-Urea (KERASAL EX) Apply 1 application topically daily as needed (toe irritation).    [provider]  traMADol (ULTRAM) 50 MG tablet Take 50 mg by mouth every 6 (six) hours as needed for moderate pain.     [provider]    Allergies Ace inhibitors, Penicillins, Aspirin, and Other  Family History  Problem Relation Age of Onset  . Pancreatic cancer Mother   . Heart attack Father     Social History Social History   Tobacco Use  . Smoking status: Former Smoker    Packs/day: 0.50    Years: 13.00    Pack years: 6.50    Types: Cigarettes    Quit date: 08/28/1975    Years since quitting: 44.5  . Smokeless tobacco: Never Used  Vaping Use  . Vaping Use: Never used  Substance Use Topics  . 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 Drug use: No    Review of Systems  Constitutional: No fever/chills Eyes: No visual changes. ENT: No sore throat. Cardiovascular: Denies chest pain. Respiratory: Denies shortness of breath. Gastrointestinal: No abdominal pain.  No nausea, no vomiting.  No diarrhea.  No constipation. Genitourinary:  Negative for dysuria. Musculoskeletal: Negative for back pain. Skin: Negative for rash. Neurological: Negative for headaches, focal weakness or numbness.   ____________________________________________   PHYSICAL EXAM:  VITAL SIGNS: ED Triage Vitals [03/19/20 1609]  Enc Vitals Group     BP (!) 169/75     Pulse Rate 95     Resp 14     Temp 98.6 F (37 C)     Temp Source Oral     SpO2 98 %     Weight      Height      Head Circumference      Peak Flow      Pain Score      Pain Loc      Pain Edu?      Excl. in [place]?     Constitutional: Alert and oriented. Well appearing and in no acute distress. Eyes: Conjunctivae are normal. PERRL. EOMI. Head: Atraumatic. Nose: No congestion/rhinnorhea. Mouth/Throat: Mucous membranes are moist.  Oropharynx non-erythematous. Neck: No stridor.   Cardiovascular: Normal rate, regular rhythm. Grossly normal heart sounds.  Good peripheral circulation. Respiratory: Normal respiratory effort.  No retractions. Lungs CTAB. Gastrointestinal: Soft and nontender. No distention. No abdominal bruits. Musculoskeletal: No lower extremity tenderness nor edema.   Neurologic:  Normal speech and language. No gross focal neurologic deficits are appreciated.  Cranial nerves II through XII are intact although visual fields were not checked finger-to-nose and rapid alternating movements in the hands were normal motor strength is 5/5 throughout patient does not report any numbness no gait instability. Skin:  Skin is warm, dry and intact. No rash noted.   ____________________________________________   LABS (all labs ordered are listed, but only abnormal results are displayed)  Labs Reviewed  CBC - Abnormal; Notable for the following components:      Result Value   RBC 4.01 (*)    Hemoglobin 12.5 (*)    HCT 36.7 (*)    All other components within normal limits  COMPREHENSIVE METABOLIC PANEL - Abnormal; Notable for the following components:   Sodium 132 (*)      Chloride 96 (*)    Glucose, Bld 218 (*)    All other components within normal limits  PROTIME-INR  APTT  DIFFERENTIAL  CBG MONITORING, ED   ____________________________________________  EKG  EKG read interpreted by me shows normal sinus rhythm rate of 79 left axis no acute ST-T wave changes there is first-degree AV block. ____________________________________________  RADIOLOGY Gertha Calkin, personally viewed and evaluated these images (plain radiographs) as part of my medical decision making, as well as reviewing the written report by the radiologist.  ED MD interpretation: CT read by radiology reviewed by me shows no acute disease chest x-ray reviewed by me shows no acute disease  Official radiology report(s): No results found.  ____________________________________________   PROCEDURES  Procedure(s) performed (including Critical Care):  Procedures   ____________________________________________   INITIAL IMPRESSION / ASSESSMENT AND PLAN / ED COURSE  Patient with no current funny feeling in the head or lightheadedness.  He was not lightheaded as I understand it when he had the episode of aphasia or dysphagia.  This has resolved.  He did not report any visual problems or numbness or weakness in his extremities.  It does however sound like he had a mini stroke or TIA.  He is too old to make a first episode of multiple sclerosis or something similar likely.             ____________________________________________   FINAL CLINICAL IMPRESSION(S) / ED DIAGNOSES  Final diagnoses:  TIA (transient ischemic attack)     ED Discharge Orders    None      *Please note:  Connor Hall was evaluated in Emergency Department on 03/19/2020 for the symptoms described in the history of present illness. He was evaluated in the context of the global COVID-19 pandemic, which necessitated consideration that the patient might be at risk for infection with the SARS-CoV-2  virus that causes COVID-19. Institutional protocols and algorithms that pertain to the evaluation of patients at risk for COVID-19 are in a state of rapid change based on information released by regulatory bodies including the CDC and federal and state organizations. These policies and algorithms were followed during the patient's care in the ED.  Some ED evaluations and interventions may be delayed as a result of limited staffing during and the pandemic.*   Note:  This document was prepared using Dragon voice recognition software and may include unintentional dictation errors.    Nena Polio, MD 03/19/20 1743

## 2020-03-19 NOTE — Assessment & Plan Note (Signed)
Pt is on amlodipine 10 and hctz 25mg .  He is allergic to ACEI .

## 2020-03-19 NOTE — Assessment & Plan Note (Signed)
Pt has anemia since 2021 and has been on meloxicam for his arthritis.  D/w pt and wife to refrain from NSAID's.  We will start iv ppi. Pt has right sided abdominal pain since about a month as well.  Pt was told by pcp to take iron supplements.  We will obtain anemia panel/ thyroid studies/viut b12/folate.  Stool occult. Consider GI consult pt is also advised for outpatient GI followup.

## 2020-03-19 NOTE — ED Notes (Signed)
Patient transported to MRI 

## 2020-03-19 NOTE — Assessment & Plan Note (Signed)
Pt presents with transient episode of aphasia lasting few minutes and resolving quickly and both wife and daughter were there with patient.  We will admit in med tele floor and pursue ischemic cva workup and also orthostatic BP and anemia workup.

## 2020-03-20 DIAGNOSIS — R4701 Aphasia: Secondary | ICD-10-CM | POA: Diagnosis present

## 2020-03-20 DIAGNOSIS — G459 Transient cerebral ischemic attack, unspecified: Secondary | ICD-10-CM

## 2020-03-20 LAB — LIPID PANEL
Cholesterol: 158 mg/dL (ref 0–200)
HDL: 62 mg/dL (ref >40)
LDL Cholesterol: 72 mg/dL (ref 0–99)
Total CHOL/HDL Ratio: 2.5 RATIO
Triglycerides: 121 mg/dL (ref <150)
VLDL: 24 mg/dL (ref 0–40)

## 2020-03-20 LAB — GLUCOSE, CAPILLARY
Glucose-Capillary: 232 mg/dL — ABNORMAL HIGH (ref 70–99)
Glucose-Capillary: 124 mg/dL — ABNORMAL HIGH (ref 70–99)
Glucose-Capillary: 199 mg/dL — ABNORMAL HIGH (ref 70–99)

## 2020-03-20 LAB — VITAMIN B12: Vitamin B-12: 653 pg/mL (ref 180–914)

## 2020-03-20 LAB — HEMOGLOBIN A1C
Hgb A1c MFr Bld: 7 % — ABNORMAL HIGH (ref 4.8–5.6)
Mean Plasma Glucose: 154.2 mg/dL

## 2020-03-20 LAB — MAGNESIUM: Magnesium: 2.1 mg/dL (ref 1.7–2.4)

## 2020-03-20 LAB — PHOSPHORUS: Phosphorus: 3.6 mg/dL (ref 2.5–4.6)

## 2020-03-20 MED ORDER — AMLODIPINE BESYLATE 10 MG PO TABS
10.0000 mg | ORAL_TABLET | ORAL | Status: DC
  Administered 2020-03-20: 10 mg via ORAL
  Filled 2020-03-20: qty 1

## 2020-03-20 MED ORDER — METFORMIN HCL 500 MG PO TABS: 1000.0000 mg | ORAL_TABLET | Freq: Two times a day (BID) | ORAL | Status: DC

## 2020-03-20 MED ORDER — DOCUSATE SODIUM 100 MG PO CAPS: 100.0000 mg | ORAL_CAPSULE | Freq: Two times a day (BID) | ORAL | Status: DC | PRN

## 2020-03-20 MED ORDER — CLOPIDOGREL BISULFATE 75 MG PO TABS: 75.0000 mg | ORAL_TABLET | Freq: Every day | ORAL | 1 refills | Status: AC

## 2020-03-20 MED ORDER — PANTOPRAZOLE SODIUM 40 MG PO TBEC
40.0000 mg | DELAYED_RELEASE_TABLET | Freq: Every day | ORAL | Status: DC
  Administered 2020-03-20: 40 mg via ORAL
  Filled 2020-03-20: qty 1

## 2020-03-20 MED ORDER — TRAMADOL HCL 50 MG PO TABS: 50.0000 mg | ORAL_TABLET | Freq: Four times a day (QID) | ORAL | Status: DC | PRN

## 2020-03-20 MED ORDER — FERROUS SULFATE 325 (65 FE) MG PO TABS: 325.0000 mg | ORAL_TABLET | Freq: Every day | ORAL | Status: DC

## 2020-03-20 MED ORDER — ENOXAPARIN SODIUM 40 MG/0.4ML ~~LOC~~ SOLN: 40.0000 mg | SUBCUTANEOUS | Status: DC

## 2020-03-20 MED ORDER — PANTOPRAZOLE SODIUM 20 MG PO TBEC: 20.0000 mg | DELAYED_RELEASE_TABLET | Freq: Every day | ORAL | 1 refills | Status: AC

## 2020-03-20 MED ORDER — PRAVASTATIN SODIUM 40 MG PO TABS: 80.0000 mg | ORAL_TABLET | Freq: Every day | ORAL | Status: DC

## 2020-03-20 NOTE — Evaluation (Signed)
Physical Therapy Evaluation Patient Details Name: Connor Hall MRN: [id number] DOB: 04/25/1941 Today's Date: 03/20/2020   History of Present Illness  Pt is a 79 y.o. male with medical history significant of htn, dm, nonobstructive cad 2019, seen in ed for transient episode of aphasia that resolved after five minutes.    Clinical Impression  Pt was A&)x4, no complaints of pain. Did report that he feels as if his word finding ability has improved since episode that led to admission, but may still be worsened compared to his baseline the last couple of months. Did endorse difficulty with recall of people's names/objects recently. Independent at baseline, intermittently will use a SPC for pain/instability due to his R knee. Very active community member.  Upon assessment the patient demonstrated UE and LE sensation, strength, and coordination WFLs. Some occasional word finding difficulty noted, but pt able to complete independently with extended time. He was able to perform all mobility independently, and ambulated ~221ft independently as well. DGI performed, indicative of low fall risk. The patient demonstrated and reported return to baseline level of functioning, no further acute PT needs indicated. PT to sign off. Please reconsult PT if pt status changes or acute needs are identified.      Follow Up Recommendations No PT follow up    Equipment Recommendations  None recommended by PT    Recommendations for Other Services       Precautions / Restrictions Precautions Precautions: None Restrictions Weight Bearing Restrictions: No      Mobility  Bed Mobility Overal bed mobility: Independent                  Transfers Overall transfer level: Independent Equipment used: None                Ambulation/Gait Ambulation/Gait assistance: Independent Gait Distance (Feet): 200 Feet Assistive device: None Gait Pattern/deviations: WFL(Within Functional Limits) Gait  velocity: slightly decreased but WFLs Gait velocity interpretation: >2.62 ft/sec, indicative of community ambulatory    Stairs            Wheelchair Mobility    Modified Rankin (Stroke Patients Only)       Balance Overall balance assessment: Independent                               Standardized Balance Assessment Standardized Balance Assessment : Dynamic Gait Index   Dynamic Gait Index Level Surface: Normal Change in Gait Speed: Mild Impairment Gait with Horizontal Head Turns: Mild Impairment Gait with Vertical Head Turns: Mild Impairment Gait and Pivot Turn: Normal Step Over Obstacle: Mild Impairment Step Around Obstacles: Normal Steps: Normal (clinical judgement/pt report) Total Score: 20       Pertinent Vitals/Pain Pain Assessment: No/denies pain    Home Living Family/patient expects to be discharged to:: Private residence Living Arrangements: Spouse/significant other Available Help at Discharge: Family Type of Home: House Home Access: Stairs to enter Entrance Stairs-Rails: Left Entrance Stairs-Number of Steps: 3 Home Layout: Two level;Able to live on main level with bedroom/bathroom Home Equipment: Kasandra Knudsen - single point      Prior Function Level of Independence: Independent;Independent with assistive device(s)         Comments: intermittently uses a cane for community ambulation due to R knee pain/instability     Hand Dominance   Dominant Hand: Right    Extremity/Trunk Assessment   Upper Extremity Assessment Upper Extremity Assessment: Overall WFL for tasks assessed (grossly  5/5, no coordination or sensation deficits)    Lower Extremity Assessment Lower Extremity Assessment: Overall WFL for tasks assessed (grossly 5/5, no coordination or sensation deficits)    Cervical / Trunk Assessment Cervical / Trunk Assessment: Normal  Communication   Communication: No difficulties;HOH  Cognition Arousal/Alertness:  Awake/alert Behavior During Therapy: WFL for tasks assessed/performed Overall Cognitive Status: Within Functional Limits for tasks assessed                                        General Comments      Exercises     Assessment/Plan    PT Assessment Patent does not need any further PT services  PT Problem List         PT Treatment Interventions      PT Goals (Current goals can be found in the Care Plan section)       Frequency     Barriers to discharge        Co-evaluation               AM-PAC PT "6 Clicks" Mobility  Outcome Measure Help needed turning from your back to your side while in a flat bed without using bedrails?: None Help needed moving from lying on your back to sitting on the side of a flat bed without using bedrails?: None Help needed moving to and from a bed to a chair (including a wheelchair)?: None Help needed standing up from a chair using your arms (e.g., wheelchair or bedside chair)?: None Help needed to walk in hospital room?: None Help needed climbing 3-5 steps with a railing? : None 6 Click Score: 24    End of Session   Activity Tolerance: Patient tolerated treatment well Patient left: in chair;with call bell/phone within reach Nurse Communication: Mobility status PT Visit Diagnosis: Other symptoms and signs involving the nervous system (C58.527)    Time: 7824-2353 PT Time Calculation (min) (ACUTE ONLY): 34 min   Charges:   PT Evaluation $PT Eval Low Complexity: 1 Low         Lieutenant Diego PT, DPT 9:26 AM,03/20/20

## 2020-03-20 NOTE — Progress Notes (Signed)
Chart reviewed, Pt visited. Speech appropriate and clear. Eating breakfast with no dysphagia. Pt advised to notify primary care MD if any further speech or language problems. Plans for DC home today. No ST indicated at discharge.

## 2020-03-20 NOTE — Progress Notes (Signed)
Connor Hall to be D/C'd Home per MD order.  Discussed prescriptions and follow up appointments with the patient. Prescriptions electronically submitted medication list explained in detail. Pt verbalized understanding.  Allergies as of 03/20/2020      Reactions   Ace Inhibitors Swelling   Face and lips   Penicillins Other (See Comments)   CHILDHOOD ALLERGY  Has patient had a PCN reaction causing immediate rash, facial/tongue/throat swelling, SOB or lightheadedness with hypotension: Unknown Has patient had a PCN reaction causing severe rash involving mucus membranes or skin necrosis: Unknown Has patient had a PCN reaction that required hospitalization: No Has patient had a PCN reaction occurring within the last 10 years: No If all of the above answers are "NO", then may proceed with Cephalosporin use.   Aspirin Rash   Other Palpitations   MSG      Medication List    STOP taking these medications   omeprazole 20 MG capsule Commonly known as: PRILOSEC     TAKE these medications   acetaminophen 650 MG CR tablet Commonly known as: TYLENOL Take 650 mg by mouth every 8 (eight) hours as needed for pain.   amLODipine 10 MG tablet Commonly known as: NORVASC Take 10 mg by mouth every morning.   Centrum Silver Ultra Mens Tabs Take 1 tablet by mouth daily.   clopidogrel 75 MG tablet Commonly known as: Plavix Take 1 tablet (75 mg total) by mouth daily.   cyanocobalamin 1000 MCG tablet Take 1,000 mcg by mouth daily.   docusate sodium 100 MG capsule Commonly known as: COLACE Take 100 mg by mouth 2 (two) times daily as needed for mild constipation.   ferrous sulfate 325 (65 FE) MG tablet Take 325 mg by mouth daily with breakfast.   glipiZIDE 10 MG 24 hr tablet Commonly known as: GLUCOTROL XL Take 10 mg by mouth daily with breakfast.   hydrochlorothiazide 25 MG tablet Commonly known as: HYDRODIURIL Take 25 mg by mouth daily.   KERASAL EX Apply 1 application topically daily  as needed (toe irritation).   levofloxacin 500 MG tablet Commonly known as: LEVAQUIN Take 500 mg by mouth daily.   Melatonin 10 MG Tabs Take 10 mg by mouth at bedtime as needed (sleep).   meloxicam 7.5 MG tablet Commonly known as: MOBIC Take 7.5 mg by mouth in the morning and at bedtime.   metFORMIN 1000 MG tablet Commonly known as: GLUCOPHAGE Take 1,000 mg by mouth 2 (two) times daily with a meal.   mometasone 50 MCG/ACT nasal spray Commonly known as: NASONEX Place 2 sprays into the nose daily.   pantoprazole 20 MG tablet Commonly known as: Protonix Take 1 tablet (20 mg total) by mouth daily.   polyethylene glycol 17 g packet Commonly known as: MIRALAX / GLYCOLAX Take 17 g by mouth daily as needed for moderate constipation.   pravastatin 80 MG tablet Commonly known as: PRAVACHOL Take 80 mg by mouth at bedtime.   pyridOXINE 50 MG tablet Commonly known as: B-6 Take 50 mg by mouth daily.   traMADol 50 MG tablet Commonly known as: ULTRAM Take 50 mg by mouth every 6 (six) hours as needed for moderate pain.       Vitals:   03/20/20 0811 03/20/20 1211  BP: 138/70 138/71  Pulse: 70 71  Resp: 16 16  Temp: 98.6 F (37 C) 99.4 F (37.4 C)  SpO2: 97% 97%    Skin clean, dry and intact without evidence of skin break down, no  evidence of skin tears noted. IV catheter discontinued intact. Site without signs and symptoms of complications. Dressing and pressure applied. Pt denies pain at this time. No complaints noted.  An After Visit Summary was printed and given to the patient. Patient waiting on ride to be discharged home [place]

## 2020-03-20 NOTE — Discharge Summary (Signed)
Connor Hall:[id number] DOB: 25-Dec-1940 DOA: 03/19/2020  PCP: Baxter Hire, MD  Admit date: 03/19/2020 Discharge date: 03/20/2020  Time spent: 45 minutes  Recommendations for Outpatient Follow-up:  1. Outpatient pcp f/u 2. Neurology referred     Discharge Diagnoses:  Principal Problem:   TIA (transient ischemic attack) Active Problems:   Anemia   Coronary artery disease involving native coronary artery of native heart   Diabetes mellitus type 2, uncomplicated ([employer])   Hypertension   Aphasia   Discharge Condition: good  Diet recommendation: regular  Filed Weights   03/19/20 2235  Weight: 90.8 kg    History of present illness:  Connor Hall is a 79 y.o. male with medical history significant of htn,dmii, nonobstructive cad 2019, seen in ed for transient episode of aphasia that resolved after five minutes. Pt reports that it has never happened before. Pt also reports that he saw his urgent care md for rectal bleeding recently and was told it was hemorrhoidal.   Hospital Course:   # Aphasia - one episode of trouble finding the word for "ceiling fan" at home. Reports does have occasional word finding difficulties particularly since covid infection few months ago. No other neurologic symptoms and neuro exam is normal. Mri/mra and carotid dopplers normal, no events on tele. - will d/c home to continue statin, will add plavix given aspirin history - outpatient neurology f/u, can likely d/c plavix next month or so  # DM, HTN, chronic anemia - continued home meds  Procedures:  none   Consultations:  none  Discharge Exam: Vitals:   03/20/20 0418 03/20/20 0811  BP: 134/77 138/70  Pulse: 67 70  Resp:  16  Temp: 97.7 F (36.5 C) 98.6 F (37 C)  SpO2: 97% 97%    General: nad Cardiovascular: rrr, no murmur Respiratory: ctab Neuro: cn 2-12 grossly intact, no drift, 5/5 strength, distal sensation intact b/l  Discharge Instructions   Discharge  Instructions    Ambulatory referral to Neurology   Complete by: As directed    An appointment is requested in approximately: 1-2 weeks   Call MD for:  difficulty breathing, headache or visual disturbances   Complete by: As directed    Call MD for:  extreme fatigue   Complete by: As directed    Call MD for:  persistant dizziness or light-headedness   Complete by: As directed    Call MD for:  persistant nausea and vomiting   Complete by: As directed    Call MD for:  temperature >100.4   Complete by: As directed    Diet - low sodium heart healthy   Complete by: As directed    Increase activity slowly   Complete by: As directed      Allergies as of 03/20/2020      Reactions   Ace Inhibitors Swelling   Face and lips   Penicillins Other (See Comments)   CHILDHOOD ALLERGY  Has patient had a PCN reaction causing immediate rash, facial/tongue/throat swelling, SOB or lightheadedness with hypotension: Unknown Has patient had a PCN reaction causing severe rash involving mucus membranes or skin necrosis: Unknown Has patient had a PCN reaction that required hospitalization: No Has patient had a PCN reaction occurring within the last 10 years: No If all of the above answers are "NO", then may proceed with Cephalosporin use.   Aspirin Rash   Other Palpitations   MSG      Medication List    TAKE these medications  acetaminophen 650 MG CR tablet Commonly known as: TYLENOL Take 650 mg by mouth every 8 (eight) hours as needed for pain.   amLODipine 10 MG tablet Commonly known as: NORVASC Take 10 mg by mouth every morning.   Centrum Silver Ultra Mens Tabs Take 1 tablet by mouth daily.   clopidogrel 75 MG tablet Commonly known as: Plavix Take 1 tablet (75 mg total) by mouth daily.   cyanocobalamin 1000 MCG tablet Take 1,000 mcg by mouth daily.   docusate sodium 100 MG capsule Commonly known as: COLACE Take 100 mg by mouth 2 (two) times daily as needed for mild constipation.    ferrous sulfate 325 (65 FE) MG tablet Take 325 mg by mouth daily with breakfast.   glipiZIDE 10 MG 24 hr tablet Commonly known as: GLUCOTROL XL Take 10 mg by mouth daily with breakfast.   hydrochlorothiazide 25 MG tablet Commonly known as: HYDRODIURIL Take 25 mg by mouth daily.   KERASAL EX Apply 1 application topically daily as needed (toe irritation).   levofloxacin 500 MG tablet Commonly known as: LEVAQUIN Take 500 mg by mouth daily.   Melatonin 10 MG Tabs Take 10 mg by mouth at bedtime as needed (sleep).   meloxicam 7.5 MG tablet Commonly known as: MOBIC Take 7.5 mg by mouth in the morning and at bedtime.   metFORMIN 1000 MG tablet Commonly known as: GLUCOPHAGE Take 1,000 mg by mouth 2 (two) times daily with a meal.   mometasone 50 MCG/ACT nasal spray Commonly known as: NASONEX Place 2 sprays into the nose daily.   omeprazole 20 MG capsule Commonly known as: PRILOSEC Take 20 mg by mouth every morning.   polyethylene glycol 17 g packet Commonly known as: MIRALAX / GLYCOLAX Take 17 g by mouth daily as needed for moderate constipation.   pravastatin 80 MG tablet Commonly known as: PRAVACHOL Take 80 mg by mouth at bedtime.   pyridOXINE 50 MG tablet Commonly known as: B-6 Take 50 mg by mouth daily.   traMADol 50 MG tablet Commonly known as: ULTRAM Take 50 mg by mouth every 6 (six) hours as needed for moderate pain.      Allergies  Allergen Reactions  . Ace Inhibitors Swelling    Face and lips  . Penicillins Other (See Comments)    CHILDHOOD ALLERGY  Has patient had a PCN reaction causing immediate rash, facial/tongue/throat swelling, SOB or lightheadedness with hypotension: Unknown Has patient had a PCN reaction causing severe rash involving mucus membranes or skin necrosis: Unknown Has patient had a PCN reaction that required hospitalization: No Has patient had a PCN reaction occurring within the last 10 years: No If all of the above answers are  "NO", then may proceed with Cephalosporin use.    . Aspirin Rash  . Other Palpitations    MSG    Follow-up Information    Baxter Hire, MD. Schedule an appointment as soon as possible for a visit.   Specialty: Internal Medicine Contact information: [place] [address] [phone]                The results of significant diagnostics from this hospitalization (including imaging, microbiology, ancillary and laboratory) are listed below for reference.    Significant Diagnostic Studies: CT HEAD WO CONTRAST  Result Date: 03/19/2020 CLINICAL DATA:  Lightheadedness for 2 weeks. Episode of expressive aphasia today. EXAM: CT HEAD WITHOUT CONTRAST TECHNIQUE: Contiguous axial images were obtained from the base of the skull through the  vertex without intravenous contrast. COMPARISON:  Head MRI 02/06/2009 FINDINGS: Brain: There is no evidence of an acute infarct, intracranial hemorrhage, mass, midline shift, or extra-axial fluid collection. Patchy T2 hyperintensities in the cerebral white matter bilaterally are nonspecific but compatible with mild-to-moderate chronic small vessel ischemic disease, progressed from the prior MRI. The ventricles and sulci are within normal limits for age. Vascular: Calcified atherosclerosis at the skull base. No hyperdense vessel. Skull: No fracture or suspicious osseous lesion. Sinuses/Orbits: Visualized paranasal sinuses and mastoid air cells are clear. Visualized orbits are unremarkable. Other: None. IMPRESSION: 1. No evidence of acute intracranial abnormality. 2. Mild-to-moderate chronic small vessel ischemic disease. Electronically Signed   By: Logan Bores M.D.   On: 03/19/2020 17:34   MR ANGIO HEAD WO CONTRAST  Result Date: 03/19/2020 CLINICAL DATA:  TIA. Lightheadedness and transient speech disturbance. EXAM: MRI HEAD WITHOUT CONTRAST MRA HEAD WITHOUT CONTRAST TECHNIQUE: Multiplanar, multiecho pulse sequences of the brain and  surrounding structures were obtained without intravenous contrast. Angiographic images of the head were obtained using MRA technique without contrast. COMPARISON:  Head CT 03/19/2020 and MRI 02/06/2009 FINDINGS: MRI HEAD FINDINGS Brain: No acute infarct, mass, midline shift, or extra-axial fluid collection is identified. There is a single focus of chronic microhemorrhage in the posterior left frontal lobe. Patchy T2 hyperintensities in the cerebral white matter bilaterally are nonspecific but compatible with mild-to-moderate chronic small vessel ischemic disease and have progressed from the prior MRI. Mild cerebral atrophy is within normal limits for age. A nonenlarged partially empty sella is unchanged from the prior MRI. Vascular: Major intracranial vascular flow voids are preserved. Skull and upper cervical spine: Unremarkable bone marrow signal. Sinuses/Orbits: Bilateral cataract extraction. Paranasal sinuses and mastoid air cells are clear. Other: None. MRA HEAD FINDINGS The visualized distal vertebral arteries are widely patent to the basilar with the left being moderately dominant. Patent left PICA, bilateral AICA, and bilateral SCA origins are visualized. The basilar artery is widely patent. The PCAs are patent with asymmetric attenuation and irregularity of distal right PCA branch vessels but no evidence of a flow limiting proximal stenosis. The internal carotid arteries are widely patent from skull base to carotid termini. ACAs and MCAs are patent with distal branch vessel irregularity but no evidence of a proximal branch occlusion or flow limiting proximal stenosis. No aneurysm is identified. IMPRESSION: 1. No acute intracranial abnormality. 2. Mild-to-moderate chronic small vessel ischemic disease, progressed from 2010. 3. Intracranial atherosclerosis without evidence of a large or medium vessel occlusion or flow limiting proximal stenosis. Electronically Signed   By: Logan Bores M.D.   On: 03/19/2020  20:03   MR BRAIN WO CONTRAST  Result Date: 03/19/2020 CLINICAL DATA:  TIA. Lightheadedness and transient speech disturbance. EXAM: MRI HEAD WITHOUT CONTRAST MRA HEAD WITHOUT CONTRAST TECHNIQUE: Multiplanar, multiecho pulse sequences of the brain and surrounding structures were obtained without intravenous contrast. Angiographic images of the head were obtained using MRA technique without contrast. COMPARISON:  Head CT 03/19/2020 and MRI 02/06/2009 FINDINGS: MRI HEAD FINDINGS Brain: No acute infarct, mass, midline shift, or extra-axial fluid collection is identified. There is a single focus of chronic microhemorrhage in the posterior left frontal lobe. Patchy T2 hyperintensities in the cerebral white matter bilaterally are nonspecific but compatible with mild-to-moderate chronic small vessel ischemic disease and have progressed from the prior MRI. Mild cerebral atrophy is within normal limits for age. A nonenlarged partially empty sella is unchanged from the prior MRI. Vascular: Major intracranial vascular flow voids are preserved. Skull  and upper cervical spine: Unremarkable bone marrow signal. Sinuses/Orbits: Bilateral cataract extraction. Paranasal sinuses and mastoid air cells are clear. Other: None. MRA HEAD FINDINGS The visualized distal vertebral arteries are widely patent to the basilar with the left being moderately dominant. Patent left PICA, bilateral AICA, and bilateral SCA origins are visualized. The basilar artery is widely patent. The PCAs are patent with asymmetric attenuation and irregularity of distal right PCA branch vessels but no evidence of a flow limiting proximal stenosis. The internal carotid arteries are widely patent from skull base to carotid termini. ACAs and MCAs are patent with distal branch vessel irregularity but no evidence of a proximal branch occlusion or flow limiting proximal stenosis. No aneurysm is identified. IMPRESSION: 1. No acute intracranial abnormality. 2.  Mild-to-moderate chronic small vessel ischemic disease, progressed from 2010. 3. Intracranial atherosclerosis without evidence of a large or medium vessel occlusion or flow limiting proximal stenosis. Electronically Signed   By: Logan Bores M.D.   On: 03/19/2020 20:03   US Carotid Bilateral (at [facility] and [facility] only)  Result Date: 03/20/2020 CLINICAL DATA:  TIA. EXAM: BILATERAL CAROTID DUPLEX ULTRASOUND TECHNIQUE: Pearline Cables scale imaging, color Doppler and duplex ultrasound were performed of bilateral carotid and vertebral arteries in the neck. COMPARISON:  None. FINDINGS: Criteria: Quantification of carotid stenosis is based on velocity parameters that correlate the residual internal carotid diameter with NASCET-based stenosis levels, using the diameter of the distal internal carotid lumen as the denominator for stenosis measurement. The following velocity measurements were obtained: RIGHT ICA: 114/27 cm/sec CCA: 44/31 cm/sec SYSTOLIC ICA/CCA RATIO:  1.3 ECA: 82 cm/sec LEFT ICA: 94/25 cm/sec CCA: 54/00 cm/sec SYSTOLIC ICA/CCA RATIO:  1.0 ECA: 89 cm/sec RIGHT CAROTID ARTERY: Minimal plaque and intimal thickening at the carotid bulb. External carotid artery is patent with normal waveform. Normal waveforms and velocities in the internal carotid artery. RIGHT VERTEBRAL ARTERY: Antegrade flow and normal waveform in the right vertebral artery. LEFT CAROTID ARTERY: Intimal thickening at the left carotid bulb. External carotid artery is patent with normal waveform. Normal waveforms and velocities in the internal carotid artery. LEFT VERTEBRAL ARTERY: Antegrade flow and normal waveform in the left vertebral artery. IMPRESSION: 1. Minimal plaque and intimal thickening in the bilateral carotid arteries. No significant carotid artery stenosis. 2. Patent vertebral arteries with antegrade flow. Electronically Signed   By: Markus Daft M.D.   On: 03/20/2020 07:55   DG Chest Portable 1 View  Result Date: 03/19/2020 CLINICAL DATA:   Acute onset expressive aphasia 1 hour ago dizziness. EXAM: PORTABLE CHEST 1 VIEW COMPARISON:  None. FINDINGS: The heart size and mediastinal contours are within normal limits. Aortic atherosclerotic calcification noted. Both lungs are clear. Pulmonary hyperinflation is seen, suspicious for COPD. 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  No active cardiopulmonary disease. Electronically Signed   By: Marlaine Hind M.D.   On: 03/19/2020 17:56    Microbiology: Recent Results (from the past 240 hour(s))  Respiratory Panel by RT PCR (Flu A&B, Covid) - Nasopharyngeal Swab     Status: None   Collection Time: 03/19/20  8:49 PM   Specimen: Nasopharyngeal Swab  Result Value Ref Range Status   SARS Coronavirus 2 by RT PCR NEGATIVE NEGATIVE Final    Comment: (NOTE) SARS-CoV-2 target nucleic acids are NOT DETECTED.  The SARS-CoV-2 RNA is generally detectable in upper respiratoy specimens during the acute phase of infection. The lowest concentration of SARS-CoV-2 viral copies this assay can detect is 131 copies/mL. A negative result does not preclude SARS-Cov-2 infection and  should not be used as the sole basis for treatment or other patient management decisions. A negative result may occur with  improper specimen collection/handling, submission of specimen other than nasopharyngeal swab, presence of viral mutation(s) within the areas targeted by this assay, and inadequate number of viral copies (<131 copies/mL). A negative result must be combined with clinical observations, patient history, and epidemiological information. The expected result is Negative.  Fact Sheet for Patients:  PinkCheek.be  Fact Sheet for Healthcare Providers:  GravelBags.it  This test is no t yet approved or cleared by the [place] FDA and  has been authorized for detection and/or diagnosis of SARS-CoV-2 by FDA under an Emergency Use Authorization (EUA). This EUA will remain   in effect (meaning this test can be used) for the duration of the COVID-19 declaration under Section 564(b)(1) of the Act, 21 U.S.C. section 360bbb-3(b)(1), unless the authorization is terminated or revoked sooner.     Influenza A by PCR NEGATIVE NEGATIVE Final   Influenza B by PCR NEGATIVE NEGATIVE Final    Comment: (NOTE) The Xpert Xpress SARS-CoV-2/FLU/RSV assay is intended as an aid in  the diagnosis of influenza from Nasopharyngeal swab specimens and  should not be used as a sole basis for treatment. Nasal washings and  aspirates are unacceptable for Xpert Xpress SARS-CoV-2/FLU/RSV  testing.  Fact Sheet for Patients: PinkCheek.be  Fact Sheet for Healthcare Providers: GravelBags.it  This test is not yet approved or cleared by the [place] FDA and  has been authorized for detection and/or diagnosis of SARS-CoV-2 by  FDA under an Emergency Use Authorization (EUA). This EUA will remain  in effect (meaning this test can be used) for the duration of the  Covid-19 declaration under Section 564(b)(1) of the Act, 21  U.S.C. section 360bbb-3(b)(1), unless the authorization is  terminated or revoked. Performed at [facility], [address] [place], [place] [address]      Labs: Basic Metabolic Panel: Recent Labs  Lab 03/19/20 1612 03/20/20 0411  NA 132*  --   K 4.1  --   CL 96*  --   CO2 24  --   GLUCOSE 218*  --   BUN 17  --   CREATININE 1.22  --   CALCIUM 9.5  --   MG  --  2.1  PHOS  --  3.6   Liver Function Tests: Recent Labs  Lab 03/19/20 1612  AST 23  ALT 20  ALKPHOS 42  BILITOT 0.9  PROT 7.7  ALBUMIN 4.7   No results for input(s): LIPASE, AMYLASE in the last 168 hours. No results for input(s): AMMONIA in the last 168 hours. CBC: Recent Labs  Lab 03/19/20 1612  WBC 5.7  NEUTROABS 3.5  HGB 12.5*  HCT 36.7*  MCV 91.5  PLT 254   Cardiac Enzymes: No results for input(s):  CKTOTAL, CKMB, CKMBINDEX, TROPONINI in the last 168 hours. BNP: BNP (last 3 results) No results for input(s): BNP in the last 8760 hours.  ProBNP (last 3 results) No results for input(s): PROBNP in the last 8760 hours.  CBG: Recent Labs  Lab 03/19/20 1709 03/20/20 0811  GLUCAP 194* 124*       Signed:  Desma Maxim MD.  [facility] 03/20/2020, 10:13 AM

## 2020-03-20 NOTE — Discharge Instructions (Signed)
Transient Ischemic Attack  A transient ischemic attack (TIA) is a "warning stroke" that causes stroke-like symptoms that go away quickly. A TIA does not cause lasting damage to the brain. But having a TIA is a sign that you may be at risk for a stroke. Lifestyle changes and medical treatments can help prevent a stroke. It is important to know the symptoms of a TIA and what to do. Get help right away, even if your symptoms go away. The symptoms of a TIA are the same as those of a stroke. They can happen fast, and they usually go away within minutes or hours. They can include:  Weakness or loss of feeling in your face, arm, or leg. This often happens on one side of your body.  Trouble walking.  Trouble moving your arms or legs.  Trouble talking or understanding what people are saying.  Trouble seeing.  Seeing two of one object (double vision).  Feeling dizzy.  Feeling confused.  Loss of balance or coordination.  Feeling sick to your stomach (nauseous) and throwing up (vomiting).  A very bad headache for no reason. What increases the risk? Certain things may make you more likely to have a TIA. Some of these are things that you can change, such as:  Being very overweight (obese).  Using products that contain nicotine or tobacco, such as cigarettes and e-cigarettes.  Taking birth control pills.  Not being active.  Drinking too much alcohol.  Using drugs. Other risk factors include:  Having an irregular heartbeat (atrial fibrillation).  Being African American or Hispanic.  Having had blood clots, stroke, TIA, or heart attack in the past.  Being a woman with a history of high blood pressure in pregnancy (preeclampsia).  Being over the age of 60.  Being male.  Having family history of stroke.  Having the following diseases or conditions: ? High blood pressure. ? High cholesterol. ? Diabetes. ? Heart disease. ? Sickle cell disease. ? Sleep apnea. ? Migraine  headache. ? Long-term (chronic) diseases that cause soreness and swelling (inflammation). ? Disorders that affect how your blood clots. Follow these instructions at home: Medicines   Take over-the-counter and prescription medicines only as told by your doctor.  If you were told to take aspirin or another medicine to thin your blood, take it exactly as told by your doctor. ? Taking too much of the medicine can cause bleeding. ? Taking too little of the medicine may not work to treat the problem. Eating and drinking   Eat 5 or more servings of fruits and vegetables each day.  Follow instructions from your doctor about your diet. You may need to follow a certain diet to help lower your risk of having a stroke. You may need to: ? Eat a diet that is low in fat and salt. ? Eat foods that contain a lot of fiber. ? Limit the amount of carbohydrates and sugar in your diet.  Limit alcohol intake to 1 drink a day for nonpregnant women and 2 drinks a day for men. One drink equals 12 oz of beer, 5 oz of wine, or 1 oz of hard liquor. General instructions  Keep a healthy weight.  Stay active. Try to get at least 30 minutes of activity on all or most days.  Find out if you have a condition called sleep apnea. Get treatment if needed.  Do not use any products that contain nicotine or tobacco, such as cigarettes and e-cigarettes. If you need help quitting,   ask your doctor.  Do not abuse drugs.  Keep all follow-up visits as told by your doctor. This is important. Get help right away if:  You have any signs of stroke. "BE FAST" is an easy way to remember the main warning signs: ? B - Balance. Signs are dizziness, sudden trouble walking, or loss of balance. ? E - Eyes. Signs are trouble seeing or a sudden change in how you see. ? F - Face. Signs are sudden weakness or loss of feeling of the face, or the face or eyelid drooping on one side. ? A - Arms. Signs are weakness or loss of feeling in an  arm. This happens suddenly and usually on one side of the body. ? S - Speech. Signs are sudden trouble speaking, slurred speech, or trouble understanding what people say. ? T - Time. Time to call emergency services. Write down what time symptoms started.  You have other signs of stroke, such as: ? A sudden, very bad headache with no known cause. ? Feeling sick to your stomach (nausea). ? Throwing up (vomiting). ? Jerky movements that you cannot control (seizure). These symptoms may be an emergency. Do not wait to see if the symptoms will go away. Get medical help right away. Call your local emergency services (911 in the [place] Do not drive yourself to the hospital. Summary  A transient ischemic attack (TIA) is a "warning stroke" that causes stroke-like symptoms that go away quickly.  A TIA is a medical emergency. Get help right away, even if your symptoms go away.  A TIA does not cause lasting damage to the brain.  Having a TIA is a sign that you may be at risk for a stroke. Lifestyle changes and medical treatments can help prevent a stroke. This information is not intended to replace advice given to you by your health care provider. Make sure you discuss any questions you have with your health care provider. Document Revised: 01/16/2018 Document Reviewed: 07/24/2016 Elsevier Patient Education  2020 [employer]  

## 2020-03-20 NOTE — Plan of Care (Signed)
Pt admitted to unit. In no acute distress. Oriented to room and call bell. Will continue to monitor.  Problem: Education: Goal: Knowledge of General Education information will improve Description: Including pain rating scale, medication(s)/side effects and non-pharmacologic comfort measures Outcome: Progressing   Problem: Health Behavior/Discharge Planning: Goal: Ability to manage health-related needs will improve Outcome: Progressing   Problem: Clinical Measurements: Goal: Ability to maintain clinical measurements within normal limits will improve Outcome: Progressing Goal: Will remain free from infection Outcome: Progressing Goal: Diagnostic test results will improve Outcome: Progressing Goal: Respiratory complications will improve Outcome: Progressing Goal: Cardiovascular complication will be avoided Outcome: Progressing   Problem: Activity: Goal: Risk for activity intolerance will decrease Outcome: Progressing   Problem: Nutrition: Goal: Adequate nutrition will be maintained Outcome: Progressing   Problem: Coping: Goal: Level of anxiety will decrease Outcome: Progressing   Problem: Elimination: Goal: Will not experience complications related to bowel motility Outcome: Progressing Goal: Will not experience complications related to urinary retention Outcome: Progressing   Problem: Pain Managment: Goal: General experience of comfort will improve Outcome: Progressing   Problem: Safety: Goal: Ability to remain free from injury will improve Outcome: Progressing   Problem: Skin Integrity: Goal: Risk for impaired skin integrity will decrease Outcome: Progressing

## 2020-03-20 NOTE — Progress Notes (Signed)
OT Cancellation Note  Patient Details Name: Connor Hall MRN: [id number] DOB: 1940-07-14   Cancelled Treatment:    Reason Eval/Treat Not Completed: OT screened, no needs identified, will sign off. Consult received, chart reviewed. Upon attempt, pt independent, endorses feeling at baseline. Denies difficulty with ADL, mobility, higher level IADL tasks such as using his phone. No skilled OT needs identified. Will sign off. Please re-consult if additional acute OT needs arise. Thank you for this consult.  Jeni Salles, MPH, MS, OTR/L ascom [phone] 03/20/20, 10:32 AM

## 2020-04-05 ENCOUNTER — Emergency Department
Admission: EM | Admit: 2020-04-05 | Discharge: 2020-04-05 | Disposition: A | Discharge status: Home / Self Care | Payer: Medicare HMO | Attending: Student in an Organized Health Care Education/Training Program | Admitting: Student in an Organized Health Care Education/Training Program

## 2020-04-05 ENCOUNTER — Other Ambulatory Visit: Payer: Self-pay

## 2020-04-05 DIAGNOSIS — M436 Torticollis: Secondary | ICD-10-CM | POA: Diagnosis not present

## 2020-04-05 DIAGNOSIS — Z79899 Other long term (current) drug therapy: Secondary | ICD-10-CM | POA: Insufficient documentation

## 2020-04-05 DIAGNOSIS — Z87891 Personal history of nicotine dependence: Secondary | ICD-10-CM | POA: Diagnosis not present

## 2020-04-05 DIAGNOSIS — E119 Type 2 diabetes mellitus without complications: Secondary | ICD-10-CM | POA: Insufficient documentation

## 2020-04-05 DIAGNOSIS — J45909 Unspecified asthma, uncomplicated: Secondary | ICD-10-CM | POA: Insufficient documentation

## 2020-04-05 DIAGNOSIS — I2510 Atherosclerotic heart disease of native coronary artery without angina pectoris: Secondary | ICD-10-CM | POA: Insufficient documentation

## 2020-04-05 DIAGNOSIS — I10 Essential (primary) hypertension: Secondary | ICD-10-CM | POA: Diagnosis not present

## 2020-04-05 DIAGNOSIS — Z7984 Long term (current) use of oral hypoglycemic drugs: Secondary | ICD-10-CM | POA: Diagnosis not present

## 2020-04-05 DIAGNOSIS — M542 Cervicalgia: Secondary | ICD-10-CM | POA: Diagnosis present

## 2020-04-05 MED ORDER — CYCLOBENZAPRINE HCL 10 MG PO TABS
5.0000 mg | ORAL_TABLET | Freq: Once | ORAL | Status: AC
  Administered 2020-04-05: 5 mg via ORAL
  Filled 2020-04-05: qty 1

## 2020-04-05 MED ORDER — CYCLOBENZAPRINE HCL 5 MG PO TABS: 5.0000 mg | ORAL_TABLET | Freq: Three times a day (TID) | ORAL | 0 refills | Status: AC | PRN

## 2020-04-05 NOTE — Discharge Instructions (Addendum)
You exam is normal without any signs of serious findings. Take the muscle relaxant as needed. Apply moist heat or cool compresses to promote healing. Follow-up with your provider for continued symptoms.

## 2020-04-05 NOTE — ED Triage Notes (Signed)
Pt states he woke up with a "crick" in the right side of the neck and by Monday that side was better but started having pain to the left side, states heat seems to help a little, denies any known history.

## 2020-04-06 NOTE — ED Provider Notes (Signed)
[facility] Emergency Department Provider Note ____________________________________________  Time seen: 1040  I have reviewed the triage vital signs and the nursing notes.  HISTORY  Chief Complaint  Neck Pain  HPI Connor Hall is a 79 y.o. male the below medical history, presents to the ED for evaluation of  a "crick" on the rest of his neck.  Patient describes onset over the weekend, but reports that by Monday, the right side was improving, but he started to have pain on left side.  He does report some improvement with applying moist heat.  He denies any distal paresthesias, headache, nausea, vomiting, or dizziness.  Patient believes onset occurred after he tried to move a heavy piece of furniture that was about chest high, prior to onset of his symptoms.  Past Medical History:  Diagnosis Date  . Anemia   . Anxiety   . Arthritis   . Asthma    WELL CONTROLLED  . Blockage of coronary artery of heart (HCC)    MANAGING WITH MEDICINE-NO STENTS  . Cancer (HCC)    MELANOMA (NOSE, FACE AND HANDS), BASAL CELL  . Cervicalgia   . Depression   . Diabetes mellitus without complication ([place])   . Difficult intubation    damage to throat  . Diverticulitis   . Dyspnea   . GERD (gastroesophageal reflux disease)   . Headache   . Hernia of abdominal cavity   . History of hiatal hernia   . Hyperlipidemia   . Hypertension   . Sleep apnea    NO CPAP-PT DENIES SLEEP APNEA BUT STUDY WAS DONE AND PT WAS TOLD HE HAD SLEEP APNEA    Patient Active Problem List   Diagnosis Date Noted  . Aphasia 03/20/2020  . TIA (transient ischemic attack) 03/19/2020  . GERD (gastroesophageal reflux disease)   . Diabetes mellitus without complication ([place])   . Sleep apnea   . Coronary artery disease involving native coronary artery of native heart 12/08/2017  . Angina decubitus ([place]) 11/24/2017  . SOBOE (shortness of breath on exertion) 11/20/2017  . Pain of left hand 08/21/2017  .  Carpal tunnel syndrome of left wrist 08/01/2017  . Anemia 10/11/2016  . FH: colon polyps 10/11/2016  . Primary osteoarthritis of left knee 10/09/2015  . Tear of meniscus of knee 07/26/2015  . Left knee pain 07/24/2015  . Subcoracoid bursitis of right shoulder 12/09/2014  . Rotator cuff tear, non-traumatic, right 07/07/2014  . Impingement syndrome, shoulder, right 03/23/2014  . Malignant neoplasm of skin of nose 02/07/2014  . Asthma without status asthmaticus 11/03/2013  . Diabetes mellitus type 2, uncomplicated ([place]) 44/81/8563  . Hyperlipidemia 11/03/2013  . Hypertension 11/03/2013  . Obesity 11/03/2013    Past Surgical History:  Procedure Laterality Date  . APPENDECTOMY    . [place]    . CARPAL TUNNEL RELEASE Bilateral   . CATARACT EXTRACTION W/PHACO Right 04/03/2016   Procedure: CATARACT EXTRACTION PHACO AND INTRAOCULAR LENS PLACEMENT (IOC);  Surgeon: Estill Cotta, MD;  Location: [facility] ORS;  Service: Ophthalmology;  Laterality: Right;  [place] 1.37 AP% 25.8 CDE 44.67 Fluid pack lot # [id number]  . CATARACT EXTRACTION W/PHACO Left 05/29/2016   Procedure: CATARACT EXTRACTION PHACO AND INTRAOCULAR LENS PLACEMENT (IOC);  Surgeon: Estill Cotta, MD;  Location: [facility] ORS;  Service: Ophthalmology;  Laterality: Left;  [place] 01:16 AP% 23.3 CDE 31.18 Fluid pack lot # [id number]  . COLONOSCOPY    .  COLONOSCOPY WITH PROPOFOL N/A 01/15/2017   Procedure: COLONOSCOPY WITH PROPOFOL;  Surgeon: Manya Silvas, MD;  Location: [facility] ENDOSCOPY;  Service: Endoscopy;  Laterality: N/A;  . ESOPHAGOGASTRODUODENOSCOPY    . ESOPHAGOGASTRODUODENOSCOPY (EGD) WITH PROPOFOL N/A 01/15/2017   Procedure: ESOPHAGOGASTRODUODENOSCOPY (EGD) WITH PROPOFOL;  Surgeon: Manya Silvas, MD;  Location[facility]COPY;  Service: Endoscopy;  Laterality: N/A;  . INGUINAL HERNIA REPAIR Right 05/19/2019   Procedure: HERNIA REPAIR INGUINAL ADULT;  Surgeon: Robert Bellow, MD;  Location: [facility] ORS;  Service: General;  Laterality: Right;  . KNEE ARTHROSCOPY Left 08/30/2015   Procedure: ARTHROSCOPY KNEE LEFT, partial medial menisectomy, chondral debridement;  Surgeon: Leanor Kail, MD;  Location: [facility] ORS;  Service: Orthopedics;  Laterality: Left;  . LEFT HEART CATH AND CORONARY ANGIOGRAPHY Left 11/24/2017   Procedure: LEFT HEART CATH AND CORONARY ANGIOGRAPHY;  Surgeon: Corey Skains, MD;  Location: [place] CV LAB;  Service: Cardiovascular;  Laterality: Left;  . MOHS SURGERY    . POLYPECTOMY    . ROTATOR CUFF REPAIR Right   . TRIGGER FINGER RELEASE      Prior to Admission medications   Medication Sig Start Date End Date Taking? Authorizing Provider  acetaminophen (TYLENOL) 650 MG CR tablet Take 650 mg by mouth every 8 (eight) hours as needed for pain.    [provider]  amLODipine (NORVASC) 10 MG tablet Take 10 mg by mouth every morning.     [provider]  clopidogrel (PLAVIX) 75 MG tablet Take 1 tablet (75 mg total) by mouth daily. 03/20/20 03/20/21  Wouk, Ailene Rud, MD  cyanocobalamin 1000 MCG tablet Take 1,000 mcg by mouth daily.    [provider]  cyclobenzaprine (FLEXERIL) 5 MG tablet Take 1 tablet (5 mg total) by mouth 3 (three) times daily as needed for up to 4 days. 04/05/20 04/09/20  Nakhi Choi, Dannielle Karvonen, PA-C  docusate sodium (COLACE) 100 MG capsule Take 100 mg by mouth 2 (two) times daily as needed for mild constipation.    [provider]  ferrous sulfate 325 (65 FE) MG tablet Take 325 mg by mouth daily with breakfast.    [provider]  glipiZIDE (GLUCOTROL XL) 10 MG 24 hr tablet Take 10 mg by mouth daily with breakfast.    [provider]  hydrochlorothiazide (HYDRODIURIL) 25 MG tablet Take 25 mg by mouth daily.    [provider]  Melatonin 10 MG TABS Take 10 mg by mouth at bedtime as needed (sleep).    [provider]  metFORMIN (GLUCOPHAGE) 1000 MG tablet  Take 1,000 mg by mouth 2 (two) times daily with a meal.    [provider]  mometasone (NASONEX) 50 MCG/ACT nasal spray Place 2 sprays into the nose daily.    [provider]  Multiple Vitamins-Minerals (CENTRUM SILVER ULTRA MENS) TABS Take 1 tablet by mouth daily.    [provider]  pantoprazole (PROTONIX) 20 MG tablet Take 1 tablet (20 mg total) by mouth daily. 03/20/20 03/20/21  Wouk, Ailene Rud, MD  polyethylene glycol (MIRALAX / GLYCOLAX) 17 g packet Take 17 g by mouth daily as needed for moderate constipation.    [provider]  pravastatin (PRAVACHOL) 80 MG tablet Take 80 mg by mouth at bedtime.    [provider]  pyridOXINE (B-6) 50 MG tablet Take 50 mg by mouth daily.    [provider]  Salicylic Acid-Urea (KERASAL EX) Apply 1 application topically daily as needed (toe irritation).  [provider]  traMADol (ULTRAM) 50 MG tablet Take 50 mg by mouth every 6 (six) hours as needed for moderate pain.     [provider]    Allergies Ace inhibitors, Penicillins, Aspirin, and Other  Family History  Problem Relation Age of Onset  . Pancreatic cancer Mother   . Heart attack Father     Social History Social History   Tobacco Use  . Smoking status: Former Smoker    Packs/day: 0.50    Years: 13.00    Pack years: 6.50    Types: Cigarettes    Quit date: 08/28/1975    Years since quitting: 44.6  . Smokeless tobacco: Never Used  Vaping Use  . Vaping Use: Never used  Substance Use Topics  . 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 Drug use: No    Review of Systems  Constitutional: Negative for fever. Eyes: Negative for visual changes. ENT: Negative for sore throat. Cardiovascular: Negative for chest pain. Respiratory: Negative for shortness of breath. Gastrointestinal: Negative for abdominal pain, vomiting and diarrhea. Genitourinary: Negative for  dysuria. Musculoskeletal: Negative for back pain. Reports lateral neck pain as above.  Skin: Negative for rash. Neurological: Negative for headaches, focal weakness or numbness. ____________________________________________  PHYSICAL EXAM:  VITAL SIGNS: ED Triage Vitals  Enc Vitals Group     BP 04/05/20 0939 (!) 150/74     Pulse Rate 04/05/20 0939 78     Resp 04/05/20 0939 17     Temp 04/05/20 0939 98 F (36.7 C)     Temp Source 04/05/20 0939 Oral     SpO2 04/05/20 0939 99 %     Weight 04/05/20 0940 200 lb 1.6 oz (90.8 kg)     Height 04/05/20 0940 6' (1.829 m)     Head Circumference --      Peak Flow --      Pain Score 04/05/20 0940 10     Pain Loc --      Pain Edu? --      Excl. in [place]? --     Constitutional: Alert and oriented. Well appearing and in no distress. GCS = 15 Head: Normocephalic and atraumatic. Eyes: Conjunctivae are normal. PERRL. Normal extraocular movements Neck: Supple. Normal ROM without crepitus.  No distracting midline tenderness is noted.  Patient mildly tender to palpation to the left SCM musculature.  No nuchal rigidity is appreciated. Cardiovascular: Normal rate, regular rhythm. Normal distal pulses. Respiratory: Normal respiratory effort. No wheezes/rales/rhonchi. Gastrointestinal: Soft and nontender. No distention. Musculoskeletal: Nontender with normal range of motion in all extremities.  Neurologic: Cranial nerves II to XII grossly intact.  Normal UE DTRs bilaterally.  Normal gait without ataxia. Normal speech and language. No gross focal neurologic deficits are appreciated. Skin:  Skin is warm, dry and intact. No rash noted. Psychiatric: Mood and affect are normal. Patient exhibits appropriate insight and judgment. ____________________________________________  PROCEDURES  Cyclobenzaprine 5 mg PO  Procedures ____________________________________________  INITIAL IMPRESSION / ASSESSMENT AND PLAN / ED COURSE  Patient presents to the ED for  evaluation of left-sided neck pain and spasm.  Exam is overall benign reassuring at this time.  No signs of acute neuromuscular deficit or nuchal rigidity.  Presents with reproducible pain to the lateral left neck on palpation.  Symptoms likely represent musculoskeletal spasm related to mechanical injury and strain.  Reports improvement of his pain at the time of this disposition.  He  will be discharged with a small prescription for Flexeril to take as needed.  Patient will follow up with primary provider or return to the ED as discussed.  DEEP BONAWITZ was evaluated in Emergency Department on 04/06/2020 for the symptoms described in the history of present illness. He was evaluated in the context of the global COVID-19 pandemic, which necessitated consideration that the patient might be at risk for infection with the SARS-CoV-2 virus that causes COVID-19. Institutional protocols and algorithms that pertain to the evaluation of patients at risk for COVID-19 are in a state of rapid change based on information released by regulatory bodies including the CDC and federal and state organizations. These policies and algorithms were followed during the patient's care in the ED. ____________________________________________  FINAL CLINICAL IMPRESSION(S) / ED DIAGNOSES  Final diagnoses:  Torticollis, acute      Carmie End, Dannielle Karvonen, PA-C 04/12/20 1808    Merlyn Lot, MD 04/12/20 2303

## 2020-05-30 ENCOUNTER — Ambulatory Visit: Payer: Medicare HMO | Admitting: Neurology

## 2020-06-07 ENCOUNTER — Other Ambulatory Visit: Payer: Self-pay | Admitting: General Surgery

## 2020-06-07 NOTE — Progress Notes (Signed)
Subjective:     Patient ID: Connor Hall is a 80 y.o. male.  HPI  The following portions of the patient's history were reviewed and updated as appropriate.  This an established patient is here today for: office visit. The patient is here today for evaluation of a left inguinal hernia. He had a previous right inguinal hernia repair done last year in January.  He states he has left groin discomfort that comes and goes at random times. He has to sit down for discomfort to go away. He has no t had ahis knee replacement because he had a TIA in November. He has seen Dr Manuella Ghazi and been cleared for surgery and he sees Dr Nehemiah Massed this Thursday, stress test was last week.       Chief Complaint  Patient presents with  . Inguinal Hernia    left     BP (!) 142/70   Pulse 85   Temp 37 C (98.6 F)   Ht 182.9 cm (6')   Wt 96.6 kg (213 lb)   SpO2 98%   BMI 28.89 kg/m       Past Medical History:  Diagnosis Date  . Abdominal hernia   . Allergic state   . Anemia 10/11/2016  . Anxiety   . Arthritis   . Asthma without status asthmaticus   . Cancer (CMS-HCC) Skin Cancer  . Cataract cortical, senile Removed 11/17 & 1/18  . Cervicalgia   . Colon polyp   . COVID-19 12/18/2019  . Diabetes mellitus type 2, uncomplicated (CMS-HCC) 16/02/9603  . Diverticulitis   . Diverticulosis   . Dyspnea   . GERD (gastroesophageal reflux disease)   . Headache   . Hernia of abdominal cavity   . History of hiatal hernia   . Hyperlipidemia   . Hypertension   . Hypogonadism in male   . Microalbuminuria   . Obesity   . Sleep apnea   . 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 Trigger middle finger of left hand 03/23/2014  . Trigger ring finger of right hand 03/23/2014          Past Surgical History:  Procedure Laterality Date  . APPENDECTOMY    . Arthrocopic release of the long head of the biceps tendon, followed by arthroscopic subacromial decompression  and mini incision rotator cuff repair Right 07/01/2014  . Back surgery     1978, 1989, 1996  . CATARACT EXTRACTION Left 05/29/2016  . CATARACT EXTRACTION Right 04/03/2016  . COLONOSCOPY  06/19/2011, 02/20/2006   FH Colon Polyps (Father): CBF 06/2016  . COLONOSCOPY  01/15/2017   FH Colon Polyps (Father): CBF 01/2022  . EGD  06/19/2011   Normal EGD: No repeat per PYO  . EGD  01/15/2017   Gastritis: No repeat per RTE  . ENDOSCOPIC CARPAL TUNNEL RELEASE Bilateral   . INGUINAL HERNIA REPAIR Right 05/19/2019  . KNEE ARTHROSCOPY Left 08/30/2015  . left heart cath and coronary angiography Left 11/24/2017  . MOHS SURGERY    . SIGMOIDOSCOPY    . TONSILLECTOMY    . Trigger release Right    Middle finger  . UPPER GASTROINTESTINAL ENDOSCOPY         Social History          Socioeconomic History  . Marital status: Married    Spouse name: Not on file  . Number of children: Not on file  . Years of education: Not on file  . Highest education level: Not on  file  Occupational History  . Not on file  Tobacco Use  . Smoking status: Former Smoker    Packs/day: 1.00    Years: 15.00    Pack years: 15.00    Types: Cigarettes    Start date: 05/06/1958    Quit date: 05/06/1973    Years since quitting: 47.1  . Smokeless tobacco: Never Used  . Tobacco comment: Light smoker  Vaping Use  . Vaping Use: Never used  Substance and Sexual Activity  . Alcohol use: No    Alcohol/week: 0.0 standard drinks  . Drug use: No  . Sexual activity: Yes    Partners: Female  Other Topics Concern  . Not on file  Social History Narrative  . Not on file   Social Determinants of Health   Financial Resource Strain: Not on file  Food Insecurity: Not on file  Transportation Needs: Not on file            Allergies  Allergen Reactions  . Ace Inhibitors Swelling    Can take pravastatin  . Arb-Angiotensin Receptor Antagonist Unknown    Patient unsure  what this is and does not remember an allergy.  . Aspirin Hives  . Penicillins Unknown    80 yrs old and was told he was allergic/does not know reaction  . Other Palpitations    MSG    Current Medications        Current Outpatient Medications  Medication Sig Dispense Refill  . amLODIPine (NORVASC) 10 MG tablet Take 1 tablet (10 mg total) by mouth once daily 90 tablet 3  . ascorbic acid, vitamin C, (VITAMIN C) 1000 MG tablet Take 1,000 mg by mouth once daily    . blood glucose diagnostic (ONETOUCH ULTRA TEST) test strip Use once daily Use as instructed. 100 each 3  . clopidogreL (PLAVIX) 75 mg tablet Take 1 tablet (75 mg total) by mouth once daily 90 tablet 1  . cyanocobalamin (VITAMIN B12) 1000 MCG tablet Take 1,000 mcg by mouth once daily    . docusate (COLACE) 100 MG capsule Take 100 mg by mouth every other day    . ferrous sulfate 325 (65 FE) MG tablet Take 325 mg by mouth daily with breakfast    . glipiZIDE (GLUCOTROL XL) 10 MG XL tablet TAKE 1 TABLET EVERY MORNING 90 tablet 1  . hydroCHLOROthiazide (HYDRODIURIL) 25 MG tablet TAKE 1 TABLET ONCE DAILY 90 tablet 3  . metFORMIN (GLUCOPHAGE) 1000 MG tablet TAKE 1 TABLET TWICE A DAY  WITH MEALS 180 tablet 1  . mometasone (NASONEX) 50 mcg/actuation nasal spray USE 2 SPRAYS IN EACH       NOSTRIL ONCE DAILY 51 g 3  . multivit-min-FA-lycopen-lutein (CENTRUM SILVER ULTRA MEN'S) 300-600-300 mcg Tab Take 1 tablet by mouth once daily      . ONETOUCH ULTRAMINI kit Use as instructed. 1 each 0  . pantoprazole (PROTONIX) 20 MG DR tablet Take 1 tablet (20 mg total) by mouth once daily 90 tablet 1  . polyethylene glycol (MIRALAX) packet Take 1 packet (17 g total) by mouth once daily Mix in 4-8ounces of fluid prior to taking. 14 packet 0  . pravastatin (PRAVACHOL) 80 MG tablet TAKE 1 TABLET ONCE DAILY 90 tablet 1  . pyridoxine, vitamin B6, (VITAMIN B-6) 50 MG tablet Take 50 mg by mouth once daily    . traMADoL (ULTRAM) 50 mg tablet TAKE  1 TABLET (50 MG TOTAL) BY MOUTH EVERY 6 (SIX) HOURS AS NEEDED 60 tablet 0  No current facility-administered medications for this visit.           Family History  Problem Relation Age of Onset  . Pancreatic cancer Mother   . Myocardial Infarction (Heart attack) Father 42  . Colon polyps Father        Deceased 10  . Blindness Sister   . Diabetes Brother   . Diabetes Maternal Grandmother   . No Known Problems Maternal Grandfather   . No Known Problems Paternal Grandmother   . No Known Problems Paternal Grandfather   . Diabetes Daughter   . Diabetes Brother       Review of Systems  Constitutional: Negative for chills and fever.  Respiratory: Negative for cough.        Objective:   Physical Exam Constitutional:      Appearance: Normal appearance.  Cardiovascular:     Rate and Rhythm: Normal rate and regular rhythm.     Pulses: Normal pulses.     Heart sounds: Normal heart sounds.  Pulmonary:     Effort: Pulmonary effort is normal.     Breath sounds: Normal breath sounds.  Abdominal:    Neurological:     Mental Status: He is alert and oriented to person, place, and time.  Psychiatric:        Mood and Affect: Mood normal.        Behavior: Behavior normal.    Labs and Radiology:   Neurology notes reviewed.  Ideal recommendation would be for 1-monthobservation, neurology has approved earlier intervention which will require Plavix be held.  Patient is undergone carotid ultrasound and MRI/MRI imaging without defect noted.  CBC of 04/25/2020 showed normal hemoglobin and white blood cell count.  Basic metabolic panel of 177/37/3668showed scant hyponatremia at 135.  Mild elevation of blood sugar 115.  05/30/2020 stress echo reviewed: Normal.     Assessment:     Symptomatic left inguinal hernia.  Single episode of TIA with complete recovery.  Etiology identified.      Plan:     The patient reported extensive ecchymosis  after his initial right inguinal hernia repair in January of this year.  He will require having his Plavix held for 5 days prior procedure.  We discussed that normally we would wait 6 months after the TIA, but he does have approval from Dr. SManuella Ghazineurology to proceed as he is very symptomatic.  Surgery to be scheduled for 06-21-20. The patient has been asked to hold Plavix 5 days prior to surgery.     Entered by MLedell Noss CMA, acting as a scribe for Dr. JHervey Ard MD.   The documentation recorded by the scribe accurately reflects the service I personally performed and the decisions made by me.   JRobert Bellow MD FACS

## 2020-06-12 ENCOUNTER — Other Ambulatory Visit: Payer: Self-pay

## 2020-06-12 ENCOUNTER — Encounter
Admission: RE | Admit: 2020-06-12 | Discharge: 2020-06-12 | Disposition: A | Discharge status: Home / Self Care | Payer: Medicare HMO | Source: Ambulatory Visit | Attending: General Surgery | Admitting: General Surgery

## 2020-06-12 DIAGNOSIS — Z01818 Encounter for other preprocedural examination: Secondary | ICD-10-CM | POA: Insufficient documentation

## 2020-06-12 NOTE — Patient Instructions (Addendum)
Your procedure is scheduled on:06-21-20 [facility] Report to the Registration Desk on the 1st floor of the [facility]-Then proceed to the 2nd floor Surgery Desk in the [place] To find out your arrival time, please call [phone] between 1PM - 3PM on:06-20-20 TUESDAY  REMEMBER: Instructions that are not followed completely may result in serious medical risk, up to and including death; or upon the discretion of your surgeon and anesthesiologist your surgery may need to be rescheduled.  Do not eat food after midnight the night before surgery.  No gum chewing, lozengers or hard candies.  You may however, drink WATER up to 2 hours before you are scheduled to arrive for your surgery. Do not drink anything within 2 hours of your scheduled arrival time.  Type 1 and Type 2 diabetics should only drink water.  TAKE THESE MEDICATIONS THE MORNING OF SURGERY WITH A SIP OF WATER: -NORVASC (AMLODIPINE) -PROTONIX (PANTOPRAZOLE)-take one the night before and one on the morning of surgery - helps to prevent nausea after surgery.)  Stop Metformin 2 days prior to surgery-LAST DOSE ON 06-18-20 SUNDAY  Follow recommendations from Cardiologist, Pulmonologist or PCP regarding stopping Aspirin, Coumadin, Plavix, Eliquis, Pradaxa, or Pletal-INSTRUCTED BY DR Dwyane Luo OFFICE TO STOP PLAVIX 5 DAYS PRIOR TO SURGERY-LAST DOSE ON 06-15-20 THURSDAY  One week prior to surgery: Stop Anti-inflammatories (NSAIDS) such as Advil, Aleve, Ibuprofen, Motrin, Naproxen, Naprosyn and Aspirin based products such as Excedrin, Goodys Powder, BC Powder-OK TO TAKE TYLENOL/TRAMADOL IF NEEDED  Stop ANY OVER THE COUNTER supplements until after surgery-STOP YOUR VITAMIN B6 AND MELATONIN NOW-YOU MAY RESUME AFTER SURGERY (However, you may continue taking multivitamin and Vitamin B12 up until the day before surgery.)  No Alcohol for 24 hours before or after surgery.  No Smoking including e-cigarettes for 24 hours prior to surgery.  No  chewable tobacco products for at least 6 hours prior to surgery.  No nicotine patches on the day of surgery.  Do not use any "recreational" drugs for at least a week prior to your surgery.  Please be advised that the combination of cocaine and anesthesia may have negative outcomes, up to and including death. If you test positive for cocaine, your surgery will be cancelled.  On the morning of surgery brush your teeth with toothpaste and water, you may rinse your mouth with mouthwash if you wish. Do not swallow any toothpaste or mouthwash.  Do not wear jewelry, make-up, hairpins, clips or nail polish.  Do not wear lotions, powders, or perfumes.   Do not shave body from the neck down 48 hours prior to surgery just in case you cut yourself which could leave a site for infection.  Also, freshly shaved skin may become irritated if using the CHG soap.  Contact lenses, hearing aids and dentures may not be worn into surgery.  Do not bring valuables to the hospital. [facility] is not responsible for any missing/lost belongings or valuables.   Use CHG Soap as directed on instruction sheet  Notify your doctor if there is any change in your medical condition (cold, fever, infection).  Wear comfortable clothing (specific to your surgery type) to the hospital.  Plan for stool softeners for home use; pain medications have a tendency to cause constipation. You can also help prevent constipation by eating foods high in fiber such as fruits and vegetables and drinking plenty of fluids as your diet allows.  After surgery, you can help prevent lung complications by doing breathing exercises.  Take deep breaths  and cough every 1-2 hours. Your doctor may order a device called an Incentive Spirometer to help you take deep breaths. When coughing or sneezing, hold a pillow firmly against your incision with both hands. This is called "splinting." Doing this helps protect your incision. It also decreases belly  discomfort.  If you are being admitted to the hospital overnight, leave your suitcase in the car. After surgery it may be brought to your room.  If you are being discharged the day of surgery, you will not be allowed to drive home. You will need a responsible adult (18 years or older) to drive you home and stay with you that night.   If you are taking public transportation, you will need to have a responsible adult (18 years or older) with you. Please confirm with your physician that it is acceptable to use public transportation.   Please call the [facility] at [phone] if you have any questions about these instructions.  Visitation Policy:  Patients undergoing a surgery or procedure may have one family member or support person with them as long as that person is not COVID-19 positive or experiencing its symptoms.  That person may remain in the waiting area during the procedure.  Inpatient Visitation:    Visiting hours are 7 a.m. to 8 p.m. Patients will be allowed one visitor. The visitor may change daily. The visitor must pass COVID-19 screenings, use hand sanitizer when entering and exiting the patient's room and wear a mask at all times, including in the patient's room. Patients must also wear a mask when staff or their visitor are in the room. Masking is required regardless of vaccination status. Systemwide, no visitors 17 or younger.

## 2020-06-13 ENCOUNTER — Encounter: Payer: Self-pay | Admitting: General Surgery

## 2020-06-13 NOTE — Progress Notes (Signed)
[facility] Perioperative Services  Pre-Admission/Anesthesia Testing Clinical Review  Date: 06/13/20  Patient Demographics:  Name: Connor Hall DOB:   [birth year] MRN:   [id number]  Planned Surgical Procedure(s):    Case: [id number] Date/Time: 06/21/20 1305   Procedure: HERNIA REPAIR INGUINAL ADULT (Left )   Anesthesia type: General   Pre-op diagnosis: left inguinal hernia   Location: [facility] OR ROOM 03 / [place] [facility] FOR ANESTHESIA GROUP   Surgeons: Robert Bellow, MD    NOTE: Available PAT nursing documentation and vital signs have been reviewed. Clinical nursing staff has updated patient's PMH/PSHx, current medication list, and drug allergies/intolerances to ensure comprehensive history available to assist in medical decision making as it pertains to the aforementioned surgical procedure and anticipated anesthetic course.   Clinical Discussion:  Connor Hall is a 80 y.o. male who is submitted for pre-surgical anesthesia review and clearance prior to him undergoing the above procedure. Patient is a Former Smoker (6.5 pack years; quit 08/1975). Pertinent PMH includes: CAD, TIA, PVD, HTN, HLD, T2DM, DOE, OSAH (does not nocturnal PAP therapy), GERD (on daily PPI therapy), anemia, OA, anxiety, depression.  Patient recently seen in the ED at [facility] on 03/19/2020; notes reviewed.  Patient presented with complaints of an episode of lightheadedness and expressive aphasia that lasted +/- 5 minutes.  Patient reported that his head "just did not feel right" by the time patient was seen in the ED, he had returned to baseline and was essentially asymptomatic.  Patient denied previous similar episodes in the past.  Blood pressure was elevated 169/75 upon arrival.  Neurological exam was unremarkable.  ECG revealed sinus rhythm with first-degree AV block at a rate of 79 bpm.  Lab work-up was unremarkable.  CT imaging of the head revealed no evidence of acute intracranial  abnormality, however there was mild to moderate chronic small vessel ischemic disease noted.  Subsequent MRI imaging revealed no acute intracranial abnormalities, progressive mild to moderate chronic small vessel ischemic disease, and intracranial atherosclerosis without evidence of occlusion or flow-limiting proximal stenosis.  Carotid dopplers demonstrated minimal plaque and intimal thickening of bilateral carotid arteries with no significant stenosis.  There was patent antegrade flow in the vertebral arteries bilaterally.  Patient was diagnosed with TIA and was subsequently admitted overnight for observation.  Observation period was unremarkable.  Patient was started on clopidogrel and discharged home with plans for an outpatient neurology follow-up.  Patient was seen in post hospital follow-up consult on 05/29/2020 by Dr. Jennings Books (neurology); notes reviewed.  Recent events from hospitalization reviewed.  MD agreed with TIA diagnosis.  ABCD2 score was 4 indicating a moderate risk for CVA.  Patient to remain on clopidogrel.  Risk factor modifications discussed.  Patient discussed plans for elective orthopedic (knee) surgery and inguinal hernia repair.  Patient not symptomatic for me at this time, however hernia is bothering him. Neurologist discussed that patient should ideally wait 6 months after TIA prior to undergoing surgical intervention, however he noted that given the fact the patient was symptomatic from his inguinal hernia it would be acceptable for him to proceed at this point.  Patient to postpone knee surgery for 6 months. Patient to follow-up with outpatient neurology in 4 months or sooner if needed  Patient is followed by cardiology Nehemiah Massed, MD). He was last seen in the cardiology clinic on 06/08/2020; notes reviewed.  At the time of his clinic visit, patient noted to be doing well overall from a cardiovascular perspective. He  denied chest pain, shortness of breath, orthopnea, PND,  palpitations, peripheral edema, vertiginous symptoms, and presyncope/syncope.  Patient has a PMH (+) for CAD.  He underwent PCI in 11/2017 that revealed mild two-vessel CAD.  Medical management was recommended.  Stress echocardiogram performed on 05/30/2020 revealed normal right ventricular systolic function with mild valvular insufficiency; LVEF >55% (see full interpretation of cardiovascular testing below).  Patient is on GDMT for his HTN and HLD diagnoses.  Blood pressure was well controlled at 124/62 on currently prescribed CCB and diuretic therapies.  Patient is on a statin for his HLD.  T2DM fairly well controlled on currently prescribed regimen; last Hgb A1c 7.0% on 03/30/2020. Functional capacity, as defined by DASI, is documented as being >/= 4 METS.  No changes were made to patient's medication regimen.  Patient follow-up with outpatient cardiology 9 months or sooner if needed.  Mr. Connor Hall is scheduled to undergo an elective inguinal hernia repair on 06/21/2020 with Dr. Hervey Ard.  Given patient's past medical history significant for cardiovascular disease and recent neurological diagnosis (TIA), presurgical cardiac and neurological clearances were sought by the performing surgeon's office and PAT team.  Specialty clearances for this patient were issued as follows:   Per neurology Manuella Ghazi, MD), "patient is at a overall LOW to MEDIUM risk for perioperative stroke, however due to his symptoms, he can pursue surgical intervention at this time". Neurology advising that clopidogrel should be held for the least amount of time possible to minimize the aforementioned risk of perioperative stroke.  Additionally, neurology advising the cardiology clearance is also required prior to proceeding with the planned surgical intervention.   Per cardiology Nehemiah Massed, MD), "this patient is at the lowest risk possible for cardiovascular complication with surgical intervention.  He has no evidence of active or  significant angina and/or CHF.  Patient may proceed to surgery without restriction with a less than 1% chance of perioperative cardiovascular complications".  Again, this patient is on daily anticoagulation therapy.  He has been instructed on recommendations from both general surgery, cardiology, and neurology regarding his clopidogrel.  Specialist all agree that a 4-day hold on patient's clopidogrel should be sufficient for the planned surgical course.  Patient should restart antiplatelet medication as soon as postoperative bleeding risk deemed to be minimal by his attending surgeon. Patient is aware that his last dose of clopidogrel will be on 06/16/2020.  Patient denies perioperative complications with anesthesia in the past.  That being said, patient reports that he has been advised that he has been difficult to intubate in the past.  Patient noted previous damage to his throat following intubation attempts.  He underwent a general anesthetic course here on 05/19/2019 with no documented complications.  Vitals with BMI 04/05/2020 04/05/2020 04/05/2020  Height - 6\' 0"  -  Weight - 200 lbs 2 oz -  BMI - 36.14 -  Systolic 431 - 540  Diastolic 63 - 74  Pulse 68 - 78    Providers/Specialists:   NOTE: Primary physician provider listed below. Patient may have been seen by APP or partner within same practice.   PROVIDER ROLE / SPECIALTY LAST OV  Robert Bellow, MD General Surgery  06/07/2020  Baxter Hire, MD Primary Care Provider  04/25/2020  Serafina Royals, MD Cardiology  06/08/2020  Jennings Books, MD  neurology  05/29/2020   Allergies:  Ace inhibitors, Penicillins, Aspirin, and Other  Current Home Medications:   No current facility-administered medications for this encounter.   Marland Kitchen acetaminophen (TYLENOL) 650 MG  CR tablet  . amLODipine (NORVASC) 10 MG tablet  . clopidogrel (PLAVIX) 75 MG tablet  . Ferrous Sulfate Dried ([employer] SLOW RELEASE IRON) 45 MG TBCR  . glipiZIDE (GLUCOTROL XL)  10 MG 24 hr tablet  . hydrochlorothiazide (HYDRODIURIL) 25 MG tablet  . HYDROcodone-acetaminophen (NORCO/VICODIN) 5-325 MG tablet  . Melatonin 10 MG TABS  . metFORMIN (GLUCOPHAGE) 1000 MG tablet  . mometasone (NASONEX) 50 MCG/ACT nasal spray  . Multiple Vitamin (MULTIVITAMIN WITH MINERALS) TABS tablet  . pantoprazole (PROTONIX) 20 MG tablet  . polyethylene glycol (MIRALAX / GLYCOLAX) 17 g packet  . pravastatin (PRAVACHOL) 80 MG tablet  . pyridOXINE (B-6) 50 MG tablet  . Salicylic Acid-Urea (KERASAL EX)  . sennosides-docusate sodium (SENOKOT-S) 8.6-50 MG tablet  . traMADol (ULTRAM) 50 MG tablet  . vitamin B-12 (CYANOCOBALAMIN) 1000 MCG tablet   . 0.9 %  sodium chloride infusion  . chlorhexidine (PERIDEX) 0.12 % solution 15 mL   Or  . MEDLINE mouth rinse  . lactated ringers infusion   History:   Past Medical History:  Diagnosis Date  . Anemia   . Anxiety   . Arthritis   . Asthma    WELL CONTROLLED  . CAD (coronary artery disease)    MANAGING WITH MEDICINE-NO STENTS  . Cancer (HCC)    MELANOMA (NOSE, FACE AND HANDS), BASAL CELL  . Cervicalgia   . Depression   . Diabetes mellitus without complication ([place])   . Difficult intubation    damage to throat  . Diverticulitis   . Dyspnea   . GERD (gastroesophageal reflux disease)   . Headache   . Hernia of abdominal cavity   . History of 2019 novel coronavirus disease (COVID-19) 12/2019  . History of hiatal hernia   . Hyperlipidemia   . Hypertension   . PVD (peripheral vascular disease) ([place])   . Sleep apnea    NO CPAP-PT DENIES SLEEP APNEA BUT STUDY WAS DONE AND PT WAS TOLD HE HAD SLEEP APNEA  . TIA (transient ischemic attack) 03/19/2020   Past Surgical History:  Procedure Laterality Date  . APPENDECTOMY    . [place]    . CARPAL TUNNEL RELEASE Bilateral   . CATARACT EXTRACTION W/PHACO Right 04/03/2016   Procedure: CATARACT EXTRACTION PHACO AND INTRAOCULAR LENS  PLACEMENT (IOC);  Surgeon: Estill Cotta, MD;  Location: [facility] ORS;  Service: Ophthalmology;  Laterality: Right;  [place] 1.37 AP% 25.8 CDE 44.67 Fluid pack lot # [id number]  . CATARACT EXTRACTION W/PHACO Left 05/29/2016   Procedure: CATARACT EXTRACTION PHACO AND INTRAOCULAR LENS PLACEMENT (IOC);  Surgeon: Estill Cotta, MD;  Location: [facility] ORS;  Service: Ophthalmology;  Laterality: Left;  [place] 01:16 AP% 23.3 CDE 31.18 Fluid pack lot # [id number]  . COLONOSCOPY    . COLONOSCOPY WITH PROPOFOL N/A 01/15/2017   Procedure: COLONOSCOPY WITH PROPOFOL;  Surgeon: Manya Silvas, MD;  Location: [facility] ENDOSCOPY;  Service: Endoscopy;  Laterality: N/A;  . ESOPHAGOGASTRODUODENOSCOPY    . ESOPHAGOGASTRODUODENOSCOPY (EGD) WITH PROPOFOL N/A 01/15/2017   Procedure: ESOPHAGOGASTRODUODENOSCOPY (EGD) WITH PROPOFOL;  Surgeon: Manya Silvas, MD;  Location: [facility] ENDOSCOPY;  Service: Endoscopy;  Laterality: N/A;  . INGUINAL HERNIA REPAIR Right 05/19/2019   Procedure: HERNIA REPAIR INGUINAL ADULT;  Surgeon: Robert Bellow, MD;  Location: [facility] ORS;  Service: General;  Laterality: Right;  . KNEE ARTHROSCOPY Left 08/30/2015   Procedure: ARTHROSCOPY KNEE LEFT, partial medial menisectomy, chondral debridement;  Surgeon: Leanor Kail, MD;  Location: [facility] ORS;  Service: Orthopedics;  Laterality: Left;  . LEFT HEART CATH AND CORONARY ANGIOGRAPHY Left 11/24/2017   Procedure: LEFT HEART CATH AND CORONARY ANGIOGRAPHY;  Surgeon: Corey Skains, MD;  Location: [facility];  Service: Cardiovascular;  Laterality: Left;  . MOHS SURGERY    . POLYPECTOMY    . ROTATOR CUFF REPAIR Right   . TRIGGER FINGER RELEASE     Family History  Problem Relation Age of Onset  . Pancreatic cancer Mother   . Heart attack Father    Social History   Tobacco Use  . Smoking status: Former Smoker    Packs/day: 0.50    Years: 13.00    Pack years: 6.50    Types: Cigarettes    Quit date: 08/28/1975    Years since quitting: 44.8   . Smokeless tobacco: Never Used  Vaping Use  . Vaping Use: Never used  Substance Use Topics  . 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 Drug use: No    Pertinent Clinical Results:  LABS: Labs reviewed: Acceptable for surgery.   Lab Results  Component Value Date   WBC 5.7 03/19/2020   HGB 12.5 (L) 03/19/2020   HCT 36.7 (L) 03/19/2020   MCV 91.5 03/19/2020   PLT 254 03/19/2020   Lab Results  Component Value Date   NA 132 (L) 03/19/2020   K 4.1 03/19/2020   CO2 24 03/19/2020   GLUCOSE 218 (H) 03/19/2020   BUN 17 03/19/2020   CREATININE 1.22 03/19/2020   CALCIUM 9.5 03/19/2020   GFRNONAA >60 03/19/2020   GFRAA >60 05/17/2019    ECG: Date: 03/19/2020 Time ECG obtained: 1614 PM Rate: 79 bpm Rhythm: Sinus rhythm with first-degree AV block Axis (leads I and aVF): Normal Intervals: PR 216 ms. QRS 92  ms. QTc 403 ms. ST segment and T wave changes: No evidence of acute ST segment elevation or depression Comparison: Similar to previous tracing obtained on 05/17/2019  IMAGING / PROCEDURES: MRI ANGIOGRAPHY HEAD/BRAIN WITHOUT CONTRAST performed on 03/19/2020 1. No acute intracranial abnormality 2. Mild to moderate chronic vessel ischemic disease that has progressed from 2010 3. Intracranial atherosclerosis without evidence of a large for immediate vessel occlusion or flow-limiting proximal stenosis  DIAGNOSTIC RADIOGRAPHS OF CHEST PORTABLE 1 VIEW performed on 03/19/2020 1. Pulmonary hyperinflation is seen; suspicious for COPD 2. No active cardiopulmonary disease  CT HEAD WITHOUT CONTRAST performed on 05/20/2019 1. No evidence of acute intracranial abnormality 2. Mild to moderate chronic small vessel ischemic disease  BILATERAL CAROTID DOPPLER performed on 03/19/2020 1. Right:  Minimal plaque and intimal thickening at the carotid bulb  ECA is patent with normal waveform  Normal waveforms and velocities in the  ICA 2. Left:  Intimal thickening at the left carotid bulb  ECA is patent with normal waveform  Normal waveforms and velocities in the ICA 3. Vertebrals:  Antegrade flow and normal waveforms in the vertebral arteries bilaterally  LEFT HEART CATHETERIZATION AND CORONARY ANGIOGRAPHY performed on 11/24/2017 1. Mild two-vessel CAD  Mid LM lesion is 40% stenosed  Mid LM to ostial LAD lesion is 40% stenosed with 40% stenosed side branch and ostial circumflex  Proximal LAD is 35% stenosed  Ostial circumflex proximal circumflex lesion is 25% stenosis 2. Continue medical management of CAD risk factors for management angina/anginal equivalent symptoms; no further cardiac intervention at this time  Impression and Plan:  SPARROW SANZO has been  referred for pre-anesthesia review and clearance prior to him undergoing the planned anesthetic and procedural courses. Available labs, pertinent testing, and imaging results were personally reviewed by me.  Given patient's history of cardiovascular disease and recent neurological diagnoses, presurgical cardiac and neurological clearances were obtained.  Patient issued a MODERATE risk for perioperative stroke by neurology and an overall LOW risk of perioperative cardiovascular complications by cardiology.  Based on clinical review performed today (06/13/20), barring any significant acute changes in the patient's overall condition, it is anticipated that he will be able to proceed with the planned surgical intervention. Any acute changes in clinical condition may necessitate his procedure being postponed and/or cancelled. Pre-surgical instructions were reviewed with the patient during his PAT appointment and questions were fielded by PAT clinical staff.  Honor Loh, MSN, APRN, FNP-C, CEN [facility]  Peri-operative Services Nurse Practitioner Phone: [phone] 06/13/20 11:53 AM  NOTE: This note has been prepared using Dragon  dictation software. Despite my best ability to proofread, there is always the potential that unintentional transcriptional errors may still occur from this process.

## 2020-06-19 ENCOUNTER — Other Ambulatory Visit: Payer: Self-pay

## 2020-06-19 ENCOUNTER — Other Ambulatory Visit: Payer: Medicare HMO

## 2020-06-19 ENCOUNTER — Encounter
Admission: RE | Admit: 2020-06-19 | Discharge: 2020-06-19 | Disposition: A | Discharge status: Home / Self Care | Payer: Medicare HMO | Source: Ambulatory Visit | Attending: General Surgery | Admitting: General Surgery

## 2020-06-19 DIAGNOSIS — Z20822 Contact with and (suspected) exposure to covid-19: Secondary | ICD-10-CM | POA: Insufficient documentation

## 2020-06-19 DIAGNOSIS — Z01812 Encounter for preprocedural laboratory examination: Secondary | ICD-10-CM | POA: Insufficient documentation

## 2020-06-19 LAB — POTASSIUM: Potassium: 4 mmol/L (ref 3.5–5.1)

## 2020-06-20 LAB — SARS CORONAVIRUS 2 (TAT 6-24 HRS): SARS Coronavirus 2: NEGATIVE

## 2020-06-20 MED ORDER — CHLORHEXIDINE GLUCONATE CLOTH 2 % EX PADS
6.0000 | MEDICATED_PAD | Freq: Once | CUTANEOUS | Status: AC
  Administered 2020-06-20: 6 via TOPICAL

## 2020-06-20 MED ORDER — CEFAZOLIN SODIUM-DEXTROSE 2-4 GM/100ML-% IV SOLN
2.0000 g | INTRAVENOUS | Status: AC
  Administered 2020-06-21: 2 g via INTRAVENOUS

## 2020-06-20 MED ORDER — SODIUM CHLORIDE 0.9 % IV SOLN: INTRAVENOUS | Status: DC

## 2020-06-20 MED ORDER — CHLORHEXIDINE GLUCONATE CLOTH 2 % EX PADS
6.0000 | MEDICATED_PAD | Freq: Once | CUTANEOUS | Status: AC
  Administered 2020-06-21: 6 via TOPICAL

## 2020-06-20 MED ORDER — CHLORHEXIDINE GLUCONATE 0.12 % MT SOLN: 15.0000 mL | Freq: Once | OROMUCOSAL | Status: AC

## 2020-06-20 MED ORDER — ORAL CARE MOUTH RINSE: 15.0000 mL | Freq: Once | OROMUCOSAL | Status: AC

## 2020-06-21 ENCOUNTER — Ambulatory Visit: Payer: Medicare HMO | Admitting: Urgent Care

## 2020-06-21 ENCOUNTER — Other Ambulatory Visit: Payer: Self-pay

## 2020-06-21 ENCOUNTER — Encounter: Payer: Self-pay | Admitting: General Surgery

## 2020-06-21 ENCOUNTER — Ambulatory Visit
Admission: RE | Admit: 2020-06-21 | Discharge: 2020-06-21 | Disposition: A | Discharge status: Home / Self Care | Payer: Medicare HMO | Attending: General Surgery | Admitting: General Surgery

## 2020-06-21 ENCOUNTER — Encounter
Admission: RE | Disposition: A | Discharge status: Home / Self Care | Payer: Self-pay | Source: Home / Self Care | Attending: General Surgery

## 2020-06-21 DIAGNOSIS — Z85820 Personal history of malignant melanoma of skin: Secondary | ICD-10-CM | POA: Insufficient documentation

## 2020-06-21 DIAGNOSIS — Z87891 Personal history of nicotine dependence: Secondary | ICD-10-CM | POA: Diagnosis not present

## 2020-06-21 DIAGNOSIS — Z880 Allergy status to penicillin: Secondary | ICD-10-CM | POA: Insufficient documentation

## 2020-06-21 DIAGNOSIS — Z79899 Other long term (current) drug therapy: Secondary | ICD-10-CM | POA: Insufficient documentation

## 2020-06-21 DIAGNOSIS — Z8616 Personal history of COVID-19: Secondary | ICD-10-CM | POA: Insufficient documentation

## 2020-06-21 DIAGNOSIS — Z7984 Long term (current) use of oral hypoglycemic drugs: Secondary | ICD-10-CM | POA: Diagnosis not present

## 2020-06-21 DIAGNOSIS — Z888 Allergy status to other drugs, medicaments and biological substances status: Secondary | ICD-10-CM | POA: Insufficient documentation

## 2020-06-21 DIAGNOSIS — K4090 Unilateral inguinal hernia, without obstruction or gangrene, not specified as recurrent: Secondary | ICD-10-CM | POA: Insufficient documentation

## 2020-06-21 DIAGNOSIS — D176 Benign lipomatous neoplasm of spermatic cord: Secondary | ICD-10-CM | POA: Insufficient documentation

## 2020-06-21 DIAGNOSIS — Z7902 Long term (current) use of antithrombotics/antiplatelets: Secondary | ICD-10-CM | POA: Insufficient documentation

## 2020-06-21 DIAGNOSIS — Z886 Allergy status to analgesic agent status: Secondary | ICD-10-CM | POA: Diagnosis not present

## 2020-06-21 HISTORY — DX: Atherosclerotic heart disease of native coronary artery without angina pectoris: I25.10

## 2020-06-21 HISTORY — DX: Peripheral vascular disease, unspecified: I73.9

## 2020-06-21 HISTORY — PX: INGUINAL HERNIA REPAIR: SHX194

## 2020-06-21 LAB — GLUCOSE, CAPILLARY
Glucose-Capillary: 161 mg/dL — ABNORMAL HIGH (ref 70–99)
Glucose-Capillary: 194 mg/dL — ABNORMAL HIGH (ref 70–99)

## 2020-06-21 SURGERY — REPAIR, HERNIA, INGUINAL, ADULT
Anesthesia: General | Site: Groin | Laterality: Left

## 2020-06-21 MED ORDER — OXYCODONE HCL 5 MG PO TABS: 5.0000 mg | ORAL_TABLET | Freq: Once | ORAL | Status: DC | PRN

## 2020-06-21 MED ORDER — DEXAMETHASONE SODIUM PHOSPHATE 10 MG/ML IJ SOLN
INTRAMUSCULAR | Status: DC | PRN
  Administered 2020-06-21: 5 mg via INTRAVENOUS

## 2020-06-21 MED ORDER — CEFAZOLIN SODIUM-DEXTROSE 2-4 GM/100ML-% IV SOLN
INTRAVENOUS | Status: AC
  Filled 2020-06-21: qty 100

## 2020-06-21 MED ORDER — FENTANYL CITRATE (PF) 100 MCG/2ML IJ SOLN: 25.0000 ug | INTRAMUSCULAR | Status: DC | PRN

## 2020-06-21 MED ORDER — BUPIVACAINE HCL (PF) 0.5 % IJ SOLN
INTRAMUSCULAR | Status: AC
  Filled 2020-06-21: qty 30

## 2020-06-21 MED ORDER — FENTANYL CITRATE (PF) 100 MCG/2ML IJ SOLN
INTRAMUSCULAR | Status: DC | PRN
  Administered 2020-06-21 (×2): 50 ug via INTRAVENOUS

## 2020-06-21 MED ORDER — ACETAMINOPHEN 10 MG/ML IV SOLN
INTRAVENOUS | Status: DC | PRN
  Administered 2020-06-21: 1000 mg via INTRAVENOUS

## 2020-06-21 MED ORDER — ONDANSETRON HCL 4 MG/2ML IJ SOLN
INTRAMUSCULAR | Status: DC | PRN
  Administered 2020-06-21: 4 mg via INTRAVENOUS

## 2020-06-21 MED ORDER — LIDOCAINE HCL (CARDIAC) PF 100 MG/5ML IV SOSY
PREFILLED_SYRINGE | INTRAVENOUS | Status: DC | PRN
  Administered 2020-06-21: 100 mg via INTRAVENOUS

## 2020-06-21 MED ORDER — GLYCOPYRROLATE 0.2 MG/ML IJ SOLN
INTRAMUSCULAR | Status: DC | PRN
  Administered 2020-06-21: .2 mg via INTRAVENOUS

## 2020-06-21 MED ORDER — EPINEPHRINE PF 1 MG/ML IJ SOLN
INTRAMUSCULAR | Status: AC
  Filled 2020-06-21: qty 1

## 2020-06-21 MED ORDER — SUCCINYLCHOLINE CHLORIDE 200 MG/10ML IV SOSY
PREFILLED_SYRINGE | INTRAVENOUS | Status: AC
  Filled 2020-06-21: qty 10

## 2020-06-21 MED ORDER — PROPOFOL 10 MG/ML IV BOLUS
INTRAVENOUS | Status: AC
  Filled 2020-06-21: qty 60

## 2020-06-21 MED ORDER — PROPOFOL 10 MG/ML IV BOLUS
INTRAVENOUS | Status: DC | PRN
  Administered 2020-06-21: 150 mg via INTRAVENOUS

## 2020-06-21 MED ORDER — BUPIVACAINE-EPINEPHRINE (PF) 0.5% -1:200000 IJ SOLN
INTRAMUSCULAR | Status: DC | PRN
  Administered 2020-06-21: 20 mL
  Administered 2020-06-21: 10 mL

## 2020-06-21 MED ORDER — SUCCINYLCHOLINE CHLORIDE 20 MG/ML IJ SOLN
INTRAMUSCULAR | Status: DC | PRN
  Administered 2020-06-21: 100 mg via INTRAVENOUS

## 2020-06-21 MED ORDER — ONDANSETRON HCL 4 MG/2ML IJ SOLN: 4.0000 mg | Freq: Once | INTRAMUSCULAR | Status: DC | PRN

## 2020-06-21 MED ORDER — EPHEDRINE 5 MG/ML INJ
INTRAVENOUS | Status: AC
  Filled 2020-06-21: qty 10

## 2020-06-21 MED ORDER — CHLORHEXIDINE GLUCONATE 0.12 % MT SOLN
OROMUCOSAL | Status: AC
  Administered 2020-06-21: 15 mL via OROMUCOSAL
  Filled 2020-06-21: qty 15

## 2020-06-21 MED ORDER — EPHEDRINE SULFATE 50 MG/ML IJ SOLN
INTRAMUSCULAR | Status: DC | PRN
  Administered 2020-06-21: 5 mg via INTRAVENOUS
  Administered 2020-06-21 (×2): 10 mg via INTRAVENOUS

## 2020-06-21 MED ORDER — FENTANYL CITRATE (PF) 100 MCG/2ML IJ SOLN
INTRAMUSCULAR | Status: AC
  Filled 2020-06-21: qty 2

## 2020-06-21 MED ORDER — OXYCODONE HCL 5 MG/5ML PO SOLN: 5.0000 mg | Freq: Once | ORAL | Status: DC | PRN

## 2020-06-21 MED ORDER — ACETAMINOPHEN 10 MG/ML IV SOLN
INTRAVENOUS | Status: AC
  Filled 2020-06-21: qty 100

## 2020-06-21 SURGICAL SUPPLY — 35 items
BLADE SURG 15 STRL SS SAFETY (BLADE) ×4 IMPLANT
CANISTER SUCT 1200ML W/VALVE (MISCELLANEOUS) IMPLANT
CHLORAPREP W/TINT 26 (MISCELLANEOUS) ×2 IMPLANT
COVER WAND RF STERILE (DRAPES) IMPLANT
DECANTER SPIKE VIAL GLASS SM (MISCELLANEOUS) IMPLANT
DRAIN PENROSE 1/4X12 LTX STRL (WOUND CARE) ×2 IMPLANT
DRAPE LAPAROTOMY 100X77 ABD (DRAPES) ×2 IMPLANT
DRSG TEGADERM 4X4.75 (GAUZE/BANDAGES/DRESSINGS) ×2 IMPLANT
DRSG TELFA 4X3 1S NADH ST (GAUZE/BANDAGES/DRESSINGS) ×2 IMPLANT
ELECT REM PT RETURN 9FT ADLT (ELECTROSURGICAL) ×2
ELECTRODE REM PT RTRN 9FT ADLT (ELECTROSURGICAL) ×1 IMPLANT
GLOVE INDICATOR 8.0 STRL GRN (GLOVE) ×2 IMPLANT
GLOVE SURG ENC MOIS LTX SZ7.5 (GLOVE) ×2 IMPLANT
GOWN STRL REUS W/ TWL LRG LVL3 (GOWN DISPOSABLE) ×2 IMPLANT
GOWN STRL REUS W/TWL LRG LVL3 (GOWN DISPOSABLE) ×2
KIT TURNOVER KIT A (KITS) ×2 IMPLANT
LABEL OR SOLS (LABEL) ×2 IMPLANT
MANIFOLD NEPTUNE II (INSTRUMENTS) ×2 IMPLANT
MESH HERNIA 6X12 ULTRAPRO MED (Mesh General) ×1 IMPLANT
MESH HERNIA ULTRAPRO MED (Mesh General) ×1 IMPLANT
NEEDLE HYPO 22GX1.5 SAFETY (NEEDLE) ×4 IMPLANT
PACK BASIN MINOR ARMC (MISCELLANEOUS) ×2 IMPLANT
STRIP CLOSURE SKIN 1/2X4 (GAUZE/BANDAGES/DRESSINGS) ×2 IMPLANT
SUT PDS AB 0 CT1 27 (SUTURE) ×2 IMPLANT
SUT SURGILON 0 BLK (SUTURE) ×6 IMPLANT
SUT VIC AB 2-0 SH 27 (SUTURE) ×1
SUT VIC AB 2-0 SH 27XBRD (SUTURE) ×1 IMPLANT
SUT VIC AB 3-0 54X BRD REEL (SUTURE) ×1 IMPLANT
SUT VIC AB 3-0 BRD 54 (SUTURE) ×1
SUT VIC AB 3-0 SH 27 (SUTURE) ×1
SUT VIC AB 3-0 SH 27X BRD (SUTURE) ×1 IMPLANT
SUT VIC AB 4-0 FS2 27 (SUTURE) ×2 IMPLANT
SWABSTK COMLB BENZOIN TINCTURE (MISCELLANEOUS) ×2 IMPLANT
SYR 10ML LL (SYRINGE) ×2 IMPLANT
SYR 3ML LL SCALE MARK (SYRINGE) ×2 IMPLANT

## 2020-06-21 NOTE — H&P (Signed)
Connor Hall [id number] Jan 27, 1941     HPI:  80 y/o with symptomatic left inguinal hernia. For open repair. HX of ASA allergy, but has taken ibuprofen and Mobic without reaction in the past.   Medications Prior to Admission  Medication Sig Dispense Refill Last Dose  . acetaminophen (TYLENOL) 650 MG CR tablet Take 650 mg by mouth every 8 (eight) hours as needed for pain.   06/19/2020  . amLODipine (NORVASC) 10 MG tablet Take 10 mg by mouth every morning.    06/21/2020 at Unknown time  . clopidogrel (PLAVIX) 75 MG tablet Take 1 tablet (75 mg total) by mouth daily. 30 tablet 1 06/16/2020  . Ferrous Sulfate Dried ([employer] SLOW RELEASE IRON) 45 MG TBCR Take 45 mg by mouth daily.   06/17/2020  . glipiZIDE (GLUCOTROL XL) 10 MG 24 hr tablet Take 10 mg by mouth daily with breakfast.   06/19/2020  . hydrochlorothiazide (HYDRODIURIL) 25 MG tablet Take 25 mg by mouth daily.   06/20/2020 at Unknown time  . HYDROcodone-acetaminophen (NORCO/VICODIN) 5-325 MG tablet Take 1 tablet by mouth daily as needed for severe pain. (Patient not taking: Reported on 06/12/2020)     . Melatonin 10 MG TABS Take 10 mg by mouth at bedtime.   Past Week at Unknown time  . metFORMIN (GLUCOPHAGE) 1000 MG tablet Take 1,000 mg by mouth 2 (two) times daily with a meal.   06/19/2020  . mometasone (NASONEX) 50 MCG/ACT nasal spray Place 2 sprays into the nose daily.   06/21/2020 at Unknown time  . Multiple Vitamin (MULTIVITAMIN WITH MINERALS) TABS tablet Take 1 tablet by mouth daily. Centrum Silver   06/20/2020 at Unknown time  . pantoprazole (PROTONIX) 20 MG tablet Take 1 tablet (20 mg total) by mouth daily. (Patient taking differently: Take 20 mg by mouth every morning.) 30 tablet 1 06/21/2020 at Unknown time  . polyethylene glycol (MIRALAX / GLYCOLAX) 17 g packet Take 17 g by mouth daily as needed for moderate constipation.   06/18/2020  . pravastatin (PRAVACHOL) 80 MG tablet Take 80 mg by mouth at bedtime.   06/19/2020 at Unknown time  . pyridOXINE  (B-6) 50 MG tablet Take 50 mg by mouth daily.   Past Week at Unknown time  . sennosides-docusate sodium (SENOKOT-S) 8.6-50 MG tablet Take 1 tablet by mouth 2 (two) times daily as needed for constipation.   06/19/2020  . traMADol (ULTRAM) 50 MG tablet Take 50 mg by mouth every 6 (six) hours as needed for moderate pain.    06/18/2020  . vitamin B-12 (CYANOCOBALAMIN) 1000 MCG tablet Take 1,000 mcg by mouth daily.   06/20/2020 at Unknown time  . Salicylic Acid-Urea (KERASAL EX) Apply 1 application topically daily as needed (toe irritation). (Patient not taking: No sig reported)   Not Taking at Unknown time   Allergies  Allergen Reactions  . Ace Inhibitors Swelling    Face and lips  . Penicillins Other (See Comments)    CHILDHOOD ALLERGY  Has patient had a PCN reaction causing immediate rash, facial/tongue/throat swelling, SOB or lightheadedness with hypotension: Unknown Has patient had a PCN reaction causing severe rash involving mucus membranes or skin necrosis: Unknown Has patient had a PCN reaction that required hospitalization: No Has patient had a PCN reaction occurring within the last 10 years: No If all of the above answers are "NO", then may proceed with Cephalosporin use.    . Aspirin Hives and Rash  . Other Palpitations    MSG-headache  Past Medical History:  Diagnosis Date  . Anemia   . Anxiety   . Arthritis   . Asthma    WELL CONTROLLED  . CAD (coronary artery disease)    MANAGING WITH MEDICINE-NO STENTS  . Cancer (HCC)    MELANOMA (NOSE, FACE AND HANDS), BASAL CELL  . Cervicalgia   . Depression   . Diabetes mellitus without complication ([place])   . Difficult intubation    damage to throat  . Diverticulitis   . Dyspnea   . GERD (gastroesophageal reflux disease)   . Headache   . Hernia of abdominal cavity   . History of 2019 novel coronavirus disease (COVID-19) 12/2019  . History of hiatal hernia   . Hyperlipidemia   . Hypertension   . PVD (peripheral vascular  disease) ([place])   . Sleep apnea    NO CPAP-PT DENIES SLEEP APNEA BUT STUDY WAS DONE AND PT WAS TOLD HE HAD SLEEP APNEA  . TIA (transient ischemic attack) 03/19/2020   Past Surgical History:  Procedure Laterality Date  . APPENDECTOMY    . [place]    . CARPAL TUNNEL RELEASE Bilateral   . CATARACT EXTRACTION W/PHACO Right 04/03/2016   Procedure: CATARACT EXTRACTION PHACO AND INTRAOCULAR LENS PLACEMENT (IOC);  Surgeon: Estill Cotta, MD;  Location: [facility] ORS;  Service: Ophthalmology;  Laterality: Right;  [place] 1.37 AP% 25.8 CDE 44.67 Fluid pack lot # [id number]  . CATARACT EXTRACTION W/PHACO Left 05/29/2016   Procedure: CATARACT EXTRACTION PHACO AND INTRAOCULAR LENS PLACEMENT (IOC);  Surgeon: Estill Cotta, MD;  Location: [facility] ORS;  Service: Ophthalmology;  Laterality: Left;  [place] 01:16 AP% 23.3 CDE 31.18 Fluid pack lot # [id number]  . COLONOSCOPY    . COLONOSCOPY WITH PROPOFOL N/A 01/15/2017   Procedure: COLONOSCOPY WITH PROPOFOL;  Surgeon: Manya Silvas, MD;  Location: [facility] ENDOSCOPY;  Service: Endoscopy;  Laterality: N/A;  . ESOPHAGOGASTRODUODENOSCOPY    . ESOPHAGOGASTRODUODENOSCOPY (EGD) WITH PROPOFOL N/A 01/15/2017   Procedure: ESOPHAGOGASTRODUODENOSCOPY (EGD) WITH PROPOFOL;  Surgeon: Manya Silvas, MD;  Location: [facility] ENDOSCOPY;  Service: Endoscopy;  Laterality: N/A;  . INGUINAL HERNIA REPAIR Right 05/19/2019   Procedure: HERNIA REPAIR INGUINAL ADULT;  Surgeon: Robert Bellow, MD;  Location: [facility] ORS;  Service: General;  Laterality: Right;  . KNEE ARTHROSCOPY Left 08/30/2015   Procedure: ARTHROSCOPY KNEE LEFT, partial medial menisectomy, chondral debridement;  Surgeon: Leanor Kail, MD;  Location: [facility] ORS;  Service: Orthopedics;  Laterality: Left;  . LEFT HEART CATH AND CORONARY ANGIOGRAPHY Left 11/24/2017   Procedure: LEFT HEART CATH AND CORONARY ANGIOGRAPHY;  Surgeon: Corey Skains, MD;  Location: [facility] CV  LAB;  Service: Cardiovascular;  Laterality: Left;  . MOHS SURGERY    . POLYPECTOMY    . ROTATOR CUFF REPAIR Right   . TRIGGER FINGER RELEASE     Social History   Socioeconomic History  . Marital status: Married    Spouse name: Not on file  . Number of children: Not on file  . Years of education: Not on file  . Highest education level: Not on file  Occupational History  . Not on file  Tobacco Use  . Smoking status: Former Smoker    Packs/day: 0.50    Years: 13.00    Pack years: 6.50    Types: Cigarettes    Quit date: 08/28/1975    Years since quitting: 44.8  . Smokeless tobacco: Never Used  Vaping Use  .  Vaping Use: Never used  Substance and Sexual Activity  . 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 Drug use: No  . Sexual activity: Not on file  Other Topics Concern  . Not on file  Social History Narrative  . Not on file   Social Determinants of Health   Financial Resource Strain: Not on file  Food Insecurity: Not on file  Transportation Needs: Not on file  Physical Activity: Not on file  Stress: Not on file  Social Connections: Not on file  Intimate Partner Violence: Not on file   Social History   Social History Narrative  . Not on file     ROS: Negative.     PE: HEENT: Negative. Lungs: Clear. Cardio: RR.  Assessment/Plan:  Proceed with planned left inguinal hernia repair.   Forest Gleason [facility] 06/21/2020

## 2020-06-21 NOTE — Discharge Instructions (Signed)

## 2020-06-21 NOTE — Anesthesia Procedure Notes (Signed)
Procedure Name: Intubation Date/Time: 06/21/2020 11:23 AM Performed by: Jerrye Noble, CRNA Pre-anesthesia Checklist: Emergency Drugs available, Suction available, Patient identified and Patient being monitored Patient Re-evaluated:Patient Re-evaluated prior to induction Oxygen Delivery Method: Circle system utilized Preoxygenation: Pre-oxygenation with 100% oxygen Induction Type: IV induction Ventilation: Mask ventilation without difficulty Laryngoscope Size: McGraph and 4 Grade View: Grade I Tube type: Oral Tube size: 7.0 mm Number of attempts: 1 Airway Equipment and Method: Stylet and Video-laryngoscopy Placement Confirmation: ETT inserted through vocal cords under direct vision,  positive ETCO2 and breath sounds checked- equal and bilateral Secured at: 22 cm Tube secured with: Tape Dental Injury: Teeth and Oropharynx as per pre-operative assessment

## 2020-06-21 NOTE — Transfer of Care (Signed)
Immediate Anesthesia Transfer of Care Note  Patient: Connor Hall  Procedure(s) Performed: HERNIA REPAIR INGUINAL ADULT (Left Groin)  Patient Location: PACU  Anesthesia Type:General  Level of Consciousness: awake, drowsy and patient cooperative  Airway & Oxygen Therapy: Patient Spontanous Breathing  Post-op Assessment: Report given to RN and Post -op Vital signs reviewed and stable  Post vital signs: Reviewed and stable  Last Vitals:  Vitals Value Taken Time  BP 160/79 06/21/20 1250  Temp 36.2 C 06/21/20 1250  Pulse 82 06/21/20 1253  Resp 17 06/21/20 1253  SpO2 97 % 06/21/20 1253  Vitals shown include unvalidated device data.  Last Pain:  Vitals:   06/21/20 1250  TempSrc:   PainSc: 0-No pain      Patients Stated Pain Goal: 0 (47/65/46 5035)  Complications: No complications documented.

## 2020-06-21 NOTE — Anesthesia Preprocedure Evaluation (Addendum)
Anesthesia Evaluation  Patient identified by MRN, date of birth, ID band Patient awake    Reviewed: Allergy & Precautions, NPO status , Patient's Chart, lab work & pertinent test results  History of Anesthesia Complications (+) history of anesthetic complications (throat was "black, blue and red" and "very sore" after his last hernia repair)  Airway Mallampati: II  TM Distance: >3 FB     Dental  (+) Chipped, Teeth Intact   Pulmonary sleep apnea , neg COPD, Patient abstained from smoking.Not current smoker, former smoker,  OSA in chart but patient denies Asthma as a child   breath sounds clear to auscultation       Cardiovascular Exercise Tolerance: Good METShypertension, + angina + CAD and + Peripheral Vascular Disease  (-) Past MI (-) dysrhythmias  Rhythm:regular Rate:Normal - Systolic murmurs Cardiac cath 2019:  Mid LM lesion is 40% stenosed.  Mid LM to Ost LAD lesion is 40% stenosed with 40% stenosed side branch in Ost Cx.  Prox LAD lesion is 35% stenosed.  Ost Cx to Prox Cx lesion is 25% stenosed. Per cardiology no intervention needed. EF 60%   Neuro/Psych  Headaches, PSYCHIATRIC DISORDERS Anxiety Depression TIA   GI/Hepatic hiatal hernia, GERD  ,(+)     (-) substance abuse  ,   Endo/Other  diabetes  Renal/GU negative Renal ROS     Musculoskeletal   Abdominal   Peds  Hematology   Anesthesia Other Findings Past Medical History: No date: Anemia No date: Anxiety No date: Arthritis No date: Asthma     Comment:  WELL CONTROLLED No date: Blockage of coronary artery of heart (HCC)     Comment:  MANAGING WITH MEDICINE-NO STENTS No date: Cancer ([place])     Comment:  MELANOMA (NOSE, FACE AND HANDS), BASAL CELL No date: Cervicalgia No date: Depression No date: Diabetes mellitus without complication (HCC) No date: Diverticulitis No date: Dyspnea No date: GERD (gastroesophageal reflux disease) No date:  Headache No date: Hernia of abdominal cavity No date: History of hiatal hernia No date: Hyperlipidemia No date: Hypertension 05/2019: Sinus infection     Comment:  ON CEFDINIR-FINISHED PREDNISONE AND IS IMPROVING No date: Sleep apnea     Comment:  NO CPAP-PT DENIES SLEEP APNEA BUT STUDY WAS DONE AND PT               WAS TOLD HE HAD SLEEP APNEA  Reproductive/Obstetrics                            Anesthesia Physical  Anesthesia Plan  ASA: III  Anesthesia Plan: General ETT   Post-op Pain Management:    Induction: Intravenous  PONV Risk Score and Plan: 3 and Ondansetron and Dexamethasone  Airway Management Planned: Oral ETT  Additional Equipment: None  Intra-op Plan:   Post-operative Plan: Extubation in OR  Informed Consent: I have reviewed the patients History and Physical, chart, labs and discussed the procedure including the risks, benefits and alternatives for the proposed anesthesia with the patient or authorized representative who has indicated his/her understanding and acceptance.     Dental advisory given  Plan Discussed with: CRNA and Surgeon  Anesthesia Plan Comments: (Plan GETA and will avoid LMA given pt's negative experience with LMA during last anesthetic.)       Anesthesia Quick Evaluation

## 2020-06-21 NOTE — Anesthesia Postprocedure Evaluation (Signed)
Anesthesia Post Note  Patient: Connor Hall  Procedure(s) Performed: HERNIA REPAIR INGUINAL ADULT (Left Groin)  Patient location during evaluation: PACU Anesthesia Type: General Level of consciousness: awake and alert Pain management: pain level controlled Vital Signs Assessment: post-procedure vital signs reviewed and stable Respiratory status: spontaneous breathing, nonlabored ventilation and respiratory function stable Cardiovascular status: blood pressure returned to baseline and stable Postop Assessment: no apparent nausea or vomiting Anesthetic complications: no   No complications documented.   Last Vitals:  Vitals:   06/21/20 1345 06/21/20 1357  BP:  (!) 142/78  Pulse: 72 72  Resp: 13 18  Temp:  (!) 36 C  SpO2: 98% 99%    Last Pain:  Vitals:   06/21/20 1357  TempSrc: Temporal  PainSc: 0-No pain                 Brett Canales [name]

## 2020-06-21 NOTE — Op Note (Signed)
Preoperative diagnosis: Symptomatic left inguinal hernia.  Postoperative diagnosis: Same, indirect with lipoma of the cord.  Operative procedure: Left inguinal hernia repair excision lipoma of the cord with medium Ultra Pro mesh.  Operating surgeon: Hervey Ard, MD.  Anesthesia: General by LMA, Marcaine 0.5% with 1: 200,000 units of epinephrine; Toradol: 30 mg.  Clinical note: This 80 year old male underwent a right inguinal hernia repair in January 2021 is done well.  He has developed a left inguinal hernia submitted now for elective repair.  Hair was removed from the surgical site with clippers prior to the procedure.  He received Ancef prior to the procedure.  SCD stockings for DVT prevention.  Operative note: With the patient under adequate general anesthesia the area was cleansed with ChloraPrep and draped.  Field block anesthesia was established and a 5 cm skin line incision along the anticipated course the inguinal canal was made.  Hemostasis was electrocautery and 3-0 Vicryl sutures.  The external oblique was opened in the direction of its fibers.  A fairly sizable cord was identified.  The ilioinguinal iliohypogastric nerves were identified and protected.  A large lipoma the cord was transected at the level of the internal ring with hemostasis by a 3-0 Vicryl tie.  The inguinal hernia sac was dissected free into the preperitoneal space.  A medium Ultra Pro mesh was then smoothed into position.  The external component was laid along the floor the canal and anchored to the pubic tubercle with 0 Surgilon.  The inferior aspect of the mesh was anchored to the inguinal ligament with similar sutures in the medial and superior borders anchored to the transverse abdominis aponeurosis.  A lateral slit was made for cord passage and closed.  Nerves were returned to the bed.  Toradol was placed on the wound.  The external oblique closed with a running 2-0 Vicryl suture.  Scarpa's fascia was closed with a  running 3-0 Vicryl suture.  The skin was closed with a running 4-0 Vicryl subcuticular suture.  Benzoin Steri-Strips followed by Telfa and Tegaderm dressing were applied.  The patient tolerated the procedure well and was taken to the PACU in stable condition.

## 2020-06-22 ENCOUNTER — Encounter: Payer: Self-pay | Admitting: General Surgery

## 2020-07-20 ENCOUNTER — Telehealth: Payer: Self-pay | Admitting: Urgent Care

## 2020-07-20 NOTE — Progress Notes (Signed)
  Perioperative Services Pre-Admission/Anesthesia Testing     Date: 07/20/20  Name: Connor Hall MRN:   [id number]  Re: Request from surgery for clearance prior to scheduled procedure  Patient is scheduled to undergo a LEFT TOTAL KNEE ARTHROPLASTY on 09/06/2020 with Dr. Kurtis Bushman, MD. Patient has not been scheduled for his PAT appointment at this point, thus has not undergone review by PAT RN and/or APP for this procedure.  Received communication from primary attending surgeon's office requesting that patient be submitted for clearance from neurology. Of note, patient reviewed by perioperative services APP for anesthesia clearance back in 06/2020 prior to inguinal hernia repair, at which time clearances from both neurology and cardiology were obtained.  Clearance documents generated and faxed to appropriate provider(s) as noted below. Note will be updated to reflect communication with provider's office as it relates to clearance being provided and/or the need for office visit prior to clearance for surgery being issued.   PROVIDER SPECIALTY FAXED TO FAXED DATE   Jennings Books, MD  Neurology  [phone]  07/20/2020  Serafina Royals, MD Cardiology  [phone]  07/20/2020   Honor Loh, MSN, APRN, FNP-C, CEN [facility]  Peri-operative Services Nurse Practitioner Phone: [phone] 07/20/20 12:43 PM

## 2020-07-21 NOTE — Progress Notes (Signed)
  Perioperative Services Pre-Admission/Anesthesia Testing     Date: 07/21/20  Name: Connor Hall MRN:   [id number]  Re: Surgical clearance  Surgical clearance received from Dr. Alveria Apley office (cardiology). Patient has been cleared for the planned elective orthopedic procedure scheduled for 09/06/2020 with Dr. Kurtis Bushman.  Cardiologist notes that patient may proceed with an overall LOW risk stratification. Copy of signed clearance form placed on patient's OR chart for review by the surgical/anesthetic team on the day of his procedure.   Honor Loh, MSN, APRN, FNP-C, CEN [facility]  Peri-operative Services Nurse Practitioner Phone: [phone] 07/21/20 9:24 AM

## 2020-08-01 ENCOUNTER — Telehealth: Payer: Self-pay | Admitting: Urgent Care

## 2020-08-01 NOTE — Progress Notes (Signed)
  Perioperative Services Pre-Admission/Anesthesia Testing     Date: 08/01/20  Name: Connor Hall MRN:   [id number]  Re: Surgical clearance  Surgical clearance received from Dr. Trena Platt office (NEUROLOGY). Patient has been cleared for the planned procedure scheduled for 09/06/2020 with Dr. Kurtis Bushman.  Neurologist notes that patient may proceed with an overall LOW risk stratification. MD asking that patient HOLD his daily CLOPIDOGREL for 7 days prior to his procedure, with plans on restarting 3 days postoperatively, or when bleeding risk felt to be minimized by primary attending surgeon. Copy of signed clearance form placed on patient's OR chart for review by the surgical/anesthetic team on the day of his procedure.   Honor Loh, MSN, APRN, FNP-C, CEN [facility]  Peri-operative Services Nurse Practitioner Phone: [phone] 08/01/20 1:37 PM

## 2020-08-09 ENCOUNTER — Other Ambulatory Visit: Payer: Self-pay | Admitting: Orthopedic Surgery

## 2020-08-09 ENCOUNTER — Encounter: Payer: Self-pay | Admitting: Orthopedic Surgery

## 2020-08-09 NOTE — H&P (Signed)
Connor Hall MRN:  [id number] DOB/SEX:  [birth year]/male  CHIEF COMPLAINT:  Painful left Knee  HISTORY: Patient is a 80 y.o. male presented with a history of pain in the left knee. Onset of symptoms was gradual starting several years ago with gradually worsening course since that time. Prior procedures on the knee include none. Patient has been treated conservatively with over-the-counter NSAIDs and activity modification. Patient currently rates pain in the knee at 10 out of 10 with activity. There is pain at night.  PAST MEDICAL HISTORY: Patient Active Problem List   Diagnosis Date Noted  . Aphasia 03/20/2020  . TIA (transient ischemic attack) 03/19/2020  . GERD (gastroesophageal reflux disease)   . Diabetes mellitus without complication ([place])   . Sleep apnea   . Coronary artery disease involving native coronary artery of native heart 12/08/2017  . Angina decubitus ([place]) 11/24/2017  . SOBOE (shortness of breath on exertion) 11/20/2017  . Pain of left hand 08/21/2017  . Carpal tunnel syndrome of left wrist 08/01/2017  . Anemia 10/11/2016  . FH: colon polyps 10/11/2016  . Primary osteoarthritis of left knee 10/09/2015  . Tear of meniscus of knee 07/26/2015  . Left knee pain 07/24/2015  . Subcoracoid bursitis of right shoulder 12/09/2014  . Rotator cuff tear, non-traumatic, right 07/07/2014  . Impingement syndrome, shoulder, right 03/23/2014  . Malignant neoplasm of skin of nose 02/07/2014  . Asthma without status asthmaticus 11/03/2013  . Diabetes mellitus type 2, uncomplicated ([place]) 69/48/5462  . Hyperlipidemia 11/03/2013  . Hypertension 11/03/2013  . Obesity 11/03/2013   Past Medical History:  Diagnosis Date  . Anemia   . Anxiety   . Arthritis   . Asthma    WELL CONTROLLED  . CAD (coronary artery disease)    MANAGING WITH MEDICINE-NO STENTS  . Cancer (HCC)    MELANOMA (NOSE, FACE AND HANDS), BASAL CELL  . Cervicalgia   . Depression   . Diabetes mellitus without  complication ([place])   . Difficult intubation    damage to throat  . Diverticulitis   . Dyspnea   . GERD (gastroesophageal reflux disease)   . Headache   . Hernia of abdominal cavity   . History of 2019 novel coronavirus disease (COVID-19) 12/2019  . History of hiatal hernia   . Hyperlipidemia   . Hypertension   . PVD (peripheral vascular disease) ([place])   . Sleep apnea    NO CPAP-PT DENIES SLEEP APNEA BUT STUDY WAS DONE AND PT WAS TOLD HE HAD SLEEP APNEA  . TIA (transient ischemic attack) 03/19/2020   Past Surgical History:  Procedure Laterality Date  . APPENDECTOMY    . [place]    . CARPAL TUNNEL RELEASE Bilateral   . CATARACT EXTRACTION W/PHACO Right 04/03/2016   Procedure: CATARACT EXTRACTION PHACO AND INTRAOCULAR LENS PLACEMENT (IOC);  Surgeon: Estill Cotta, MD;  Location: [facility] ORS;  Service: Ophthalmology;  Laterality: Right;  [place] 1.37 AP% 25.8 CDE 44.67 Fluid pack lot # [id number]  . CATARACT EXTRACTION W/PHACO Left 05/29/2016   Procedure: CATARACT EXTRACTION PHACO AND INTRAOCULAR LENS PLACEMENT (IOC);  Surgeon: Estill Cotta, MD;  Location: [facility] ORS;  Service: Ophthalmology;  Laterality: Left;  [place] 01:16 AP% 23.3 CDE 31.18 Fluid pack lot # [id number]  . COLONOSCOPY    . COLONOSCOPY WITH PROPOFOL N/A 01/15/2017   Procedure: COLONOSCOPY WITH PROPOFOL;  Surgeon: Manya Silvas, MD;  Location: [facility] ENDOSCOPY;  Service: Endoscopy;  Laterality:  N/A;  . ESOPHAGOGASTRODUODENOSCOPY    . ESOPHAGOGASTRODUODENOSCOPY (EGD) WITH PROPOFOL N/A 01/15/2017   Procedure: ESOPHAGOGASTRODUODENOSCOPY (EGD) WITH PROPOFOL;  Surgeon: Manya Silvas, MD;  Location: [facility] ENDOSCOPY;  Service: Endoscopy;  Laterality: N/A;  . INGUINAL HERNIA REPAIR Right 05/19/2019   Procedure: HERNIA REPAIR INGUINAL ADULT;  Surgeon: Robert Bellow, MD;  Location: [facility] ORS;  Service: General;  Laterality: Right;  . INGUINAL HERNIA REPAIR Left 06/21/2020    Procedure: HERNIA REPAIR INGUINAL ADULT;  Surgeon: Robert Bellow, MD;  Location: [facility] ORS;  Service: General;  Laterality: Left;  . KNEE ARTHROSCOPY Left 08/30/2015   Procedure: ARTHROSCOPY KNEE LEFT, partial medial menisectomy, chondral debridement;  Surgeon: Leanor Kail, MD;  Location: [facility] ORS;  Service: Orthopedics;  Laterality: Left;  . LEFT HEART CATH AND CORONARY ANGIOGRAPHY Left 11/24/2017   Procedure: LEFT HEART CATH AND CORONARY ANGIOGRAPHY;  Surgeon: Corey Skains, MD;  Location: [place] CV LAB;  Service: Cardiovascular;  Laterality: Left;  . MOHS SURGERY    . POLYPECTOMY    . ROTATOR CUFF REPAIR Right   . TRIGGER FINGER RELEASE       MEDICATIONS:  (Not in a hospital admission)   ALLERGIES:   Allergies  Allergen Reactions  . Ace Inhibitors Swelling    Face and lips  . Penicillins Other (See Comments)    CHILDHOOD ALLERGY  Has patient had a PCN reaction causing immediate rash, facial/tongue/throat swelling, SOB or lightheadedness with hypotension: Unknown Has patient had a PCN reaction causing severe rash involving mucus membranes or skin necrosis: Unknown Has patient had a PCN reaction that required hospitalization: No Has patient had a PCN reaction occurring within the last 10 years: No If all of the above answers are "NO", then may proceed with Cephalosporin use.    . Aspirin Hives and Rash  . Other Palpitations    MSG-headache    REVIEW OF SYSTEMS:  Pertinent items are noted in HPI.   FAMILY HISTORY:   Family History  Problem Relation Age of Onset  . Pancreatic cancer Mother   . Heart attack Father     SOCIAL HISTORY:   Social History   Tobacco Use  . Smoking status: Former Smoker    Packs/day: 0.50    Years: 13.00    Pack years: 6.50    Types: Cigarettes    Quit date: 08/28/1975    Years since quitting: 44.9  . Smokeless tobacco: Never Used  Substance Use Topics  . 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 No rashes or lesions Neurologic: Alert and oriented X 3, normal strength and tone. Normal symmetric reflexes. Normal coordination and gait  Musculoskeletal:  ROM 0-100, Ligaments intact,  Imaging Review Plain radiographs demonstrate severe degenerative joint disease of the left knee. The overall alignment is significant varus. The bone quality appears to be good for age and reported activity level.  Assessment/Plan: Primary osteoarthritis, left knee   The patient  history, physical examination and imaging studies are consistent with advanced degenerative joint disease of the left knee. The patient has failed conservative treatment.  The clearance notes were reviewed.  After discussion with the patient it was felt that Total Knee Replacement was indicated. The procedure,  risks, and benefits of total knee arthroplasty were presented and reviewed. The risks including but not limited to aseptic loosening, infection, blood clots, vascular injury, stiffness, patella tracking problems complications among others were discussed. The patient acknowledged the explanation, agreed to proceed with the plan.  Carlynn Spry 08/09/2020, 11:05 AM

## 2020-08-30 ENCOUNTER — Other Ambulatory Visit
Admission: RE | Admit: 2020-08-30 | Discharge: 2020-08-30 | Disposition: A | Discharge status: Home / Self Care | Payer: Medicare HMO | Source: Ambulatory Visit | Attending: Orthopedic Surgery | Admitting: Orthopedic Surgery

## 2020-08-30 NOTE — Patient Instructions (Addendum)
Your procedure is scheduled on: 09/06/20 - Wednesday Report to the Registration Desk on the 1st floor of the [facility]. To find out your arrival time, please call [phone] between 1PM - 3PM on: 09/05/20 - Tuesday Report to [facility] on 09/04/20 for Covid test, Labs and Bag at 0830 am. To better prepare for surgery, please view our videos , go to [email or web address]  REMEMBER: Instructions that are not followed completely may result in serious medical risk, up to and including death; or upon the discretion of your surgeon and anesthesiologist your surgery may need to be rescheduled.  Do not eat food after midnight the night before surgery.  No gum chewing, lozengers or hard candies.  You may however, drink CLEAR liquids up to 2 hours before you are scheduled to arrive for your surgery. Do not drink anything within 2 hours of your scheduled arrival time.Type 1 and Type 2 diabetics should only drink water.  TAKE THESE MEDICATIONS THE MORNING OF SURGERY WITH A SIP OF WATER:  - amLODipine (NORVASC) 10 MG tablet - mometasone (NASONEX) 50 MCG/ACT nasal spray - pantoprazole (PROTONIX) 20 MG tablet, take one the night before and one on the morning of surgery - helps to prevent nausea after surgery.  Stop Metformin  2 days prior to surgery. Do not take 05/02, 05/03, and do not take the morning of surgery.  Follow recommendations from Cardiologist, Pulmonologist or PCP regarding stopping Aspirin, Coumadin, Plavix, Eliquis, Pradaxa, or Pletal. Clopidogrel (PLAVIX) 75 MG tablet -  Do not take  04/30, 05/01, 05/02, 05/03, and do not take the morning of surgery.  One week prior to surgery: Stop Anti-inflammatories (NSAIDS) such as Advil, Aleve, Ibuprofen, Motrin, Naproxen, Naprosyn and Aspirin based products such as Excedrin, Goodys Powder, BC Powder.  Stop ANY OVER THE COUNTER supplements 7 days prior to surgery date until after surgery .  No Alcohol for 24 hours before or  after surgery.  No Smoking including e-cigarettes for 24 hours prior to surgery.  No chewable tobacco products for at least 6 hours prior to surgery.  No nicotine patches on the day of surgery.  Do not use any "recreational" drugs for at least a week prior to your surgery.  Please be advised that the combination of cocaine and anesthesia may have negative outcomes, up to and including death. If you test positive for cocaine, your surgery will be cancelled.  On the morning of surgery brush your teeth with toothpaste and water, you may rinse your mouth with mouthwash if you wish. Do not swallow any toothpaste or mouthwash.  Do not wear jewelry, make-up, hairpins, clips or nail polish.  Do not wear lotions, powders, or perfumes.   Do not shave body from the neck down 48 hours prior to surgery just in case you cut yourself which could leave a site for infection.  Also, freshly shaved skin may become irritated if using the CHG soap.  Contact lenses, hearing aids and dentures may not be worn into surgery.  Do not bring valuables to the hospital. [facility] is not responsible for any missing/lost belongings or valuables.   Use CHG Soap or wipes as directed on instruction sheet.  Notify your doctor if there is any change in your medical condition (cold, fever, infection).  Wear comfortable clothing (specific to your surgery type) to the hospital.  Plan for stool softeners for home use; pain medications have a tendency to cause constipation. You can also help prevent constipation by eating foods high  in fiber such as fruits and vegetables and drinking plenty of fluids as your diet allows.  After surgery, you can help prevent lung complications by doing breathing exercises.  Take deep breaths and cough every 1-2 hours. Your doctor may order a device called an Incentive Spirometer to help you take deep breaths. When coughing or sneezing, hold a pillow firmly against your incision with both  hands. This is called "splinting." Doing this helps protect your incision. It also decreases belly discomfort.  If you are being admitted to the hospital overnight, leave your suitcase in the car. After surgery it may be brought to your room.  If you are being discharged the day of surgery, you will not be allowed to drive home. You will need a responsible adult (18 years or older) to drive you home and stay with you that night.   If you are taking public transportation, you will need to have a responsible adult (18 years or older) with you. Please confirm with your physician that it is acceptable to use public transportation.   Please call the [facility]. at [phone] if you have any questions about these instructions.  Surgery Visitation Policy:  Patients undergoing a surgery or procedure may have one family member or support person with them as long as that person is not COVID-19 positive or experiencing its symptoms.  That person may remain in the waiting area during the procedure.  Inpatient Visitation:    Visiting hours are 7 a.m. to 8 p.m. Inpatients will be allowed two visitors daily. The visitors may change each day during the patient's stay. No visitors under the age of 17. Any visitor under the age of 80 must be accompanied by an adult. The visitor must pass COVID-19 screenings, use hand sanitizer when entering and exiting the patient's room and wear a mask at all times, including in the patient's room. Patients must also wear a mask when staff or their visitor are in the room. Masking is required regardless of vaccination status.

## 2020-08-30 NOTE — Progress Notes (Signed)
Perioperative Services Pre-Admission/Anesthesia Testing   Date: 08/30/20 Name: MISSAEL FERRARI MRN:   [id number]  Re: Consideration of preoperative prophylactic antibiotic change   Request sent to: Lovell Sheehan, MD  And Ria Clock, PA-C (routed and/or faxed via [facility])  Planned Surgical Procedure(s):    Case: [id number] Date/Time: 09/06/20 1043   Procedure: TOTAL KNEE ARTHROPLASTY (Left Knee)   Anesthesia type: Spinal   Pre-op diagnosis: M17.12 Unilateral primary osteoarthritis, left knee   Location: [facility] OR ROOM 02 / [place] [facility]   Surgeons: Lovell Sheehan, MD    Notes: 1. Patient has a documented allergy to PCN  . The actual reaction to PCN is unknown. Occurred as a child.  2. Received cephalosporin with no documented complications . CEFAZOLIN received on 05/19/2019 and 06/21/2020  3. Screened as appropriate for cephalosporin use during medication reconciliation . No immediate angioedema, dysphagia, SOB, anaphylaxis symptoms. . No severe rash involving mucous membranes or skin necrosis. . No hospital admissions related to side effects of PCN/cephalosporin use.  . No documented reaction to PCN or cephalosporin in the last 10 years.  Request:  As an evidence based approach to reducing the rate of incidence for post-operative SSI and the development of MDROs, could an agent with narrower coverage for preoperative prophylaxis in this patient's upcoming surgical course be considered?   1. Currently ordered preoperative prophylactic ABX: clindamycin.   2. Specifically requesting change to cephalosporin (CEFAZOLIN).   3. Please communicate decision with me and I will change the orders in Epic as per your direction.   Things to consider:  Many patients report that they were "allergic" to PCN earlier in life, however this does not translate into a true lifelong allergy. Patients can lose sensitivity to specific IgE antibodies over time if PCN is avoided (Kleris  & Lugar, 2019).   Up to 10% of the adult population and 15% of hospitalized patients report an allergy to PCN, however clinical studies suggest that 90% of those reporting an allergy can tolerate PCN antibiotics (Kleris & Lugar, 2019).   Cross-sensitivity between PCN and cephalosporins has been documented as being as high as 10%, however this estimation included data believed to have been collected in a setting where there was contamination. Newer data suggests that the prevalence of cross-sensitivity between PCN and cephalosporins is actually estimated to be closer to 1% (Hermanides et al., 2018).    Patients labeled as PCN allergic, whether they are truly allergic or not, have been found to have inferior outcomes in terms of rates of serious infection, and these patients tend to have longer hospital stays (Hodge, 2019).   Treatment related secondary infections, such as Clostridioides difficile, have been linked to the improper use of broad spectrum antibiotics in patients improperly labeled as PCN allergic (Kleris & Lugar, 2019).   Anaphylaxis from cephalosporins is rare and the evidence suggests that there is no increased risk of an anaphylactic type reaction when cephalosporins are used in a PCN allergic patient (Pichichero, 2006).  Citations: Hermanides J, Lemkes BA, Prins Pearla Dubonnet MW, Terreehorst I. Presumed ?-Lactam Allergy and Cross-reactivity in the Operating Theater: A Practical Approach. Anesthesiology. 2018 Aug;129(2):335-342. doi: 10.1097/ALN.0000000000002252. PMID: 29528413.  Kleris, [place] & Lugar, P. L. (2019). Things We Do For No Reason: Failing to Question a Penicillin Allergy History. Journal of hospital medicine, 14(10), [phone]. Advance online publication. [email or web address]-m[place]leton.info/  Pichichero, M. E. (2006). Cephalosporins can be prescribed safely for penicillin-allergic patients. Journal of family medicine, 55(2), 106-112.  Accessed:  https://cdn.mdedge.com/files/s60fs-public/Document/September-2017/5502JFP_[email or web address].pdf   Honor Loh, MSN, APRN, FNP-C, CEN [facility]  Peri-operative Services Nurse Practitioner FAX: [phone] 08/30/20 12:18 PM

## 2020-09-04 ENCOUNTER — Other Ambulatory Visit
Admission: RE | Admit: 2020-09-04 | Discharge: 2020-09-04 | Disposition: A | Discharge status: Home / Self Care | Payer: Medicare HMO | Source: Ambulatory Visit | Attending: Orthopedic Surgery | Admitting: Orthopedic Surgery

## 2020-09-04 ENCOUNTER — Other Ambulatory Visit: Payer: Self-pay

## 2020-09-04 DIAGNOSIS — Z20822 Contact with and (suspected) exposure to covid-19: Secondary | ICD-10-CM | POA: Insufficient documentation

## 2020-09-04 DIAGNOSIS — Z01812 Encounter for preprocedural laboratory examination: Secondary | ICD-10-CM | POA: Diagnosis present

## 2020-09-04 LAB — PROTIME-INR
INR: 0.9 (ref 0.8–1.2)
Prothrombin Time: 12.1 seconds (ref 11.4–15.2)

## 2020-09-04 LAB — URINALYSIS, ROUTINE W REFLEX MICROSCOPIC
Bilirubin Urine: NEGATIVE
Glucose, UA: NEGATIVE mg/dL
Hgb urine dipstick: NEGATIVE
Ketones, ur: NEGATIVE mg/dL
Leukocytes,Ua: NEGATIVE
Nitrite: NEGATIVE
Protein, ur: NEGATIVE mg/dL
Specific Gravity, Urine: 1.014 (ref 1.005–1.030)
pH: 7 (ref 5.0–8.0)

## 2020-09-04 LAB — APTT: aPTT: 32 seconds (ref 24–36)

## 2020-09-04 LAB — SARS CORONAVIRUS 2 (TAT 6-24 HRS): SARS Coronavirus 2: NEGATIVE

## 2020-09-05 LAB — TYPE AND SCREEN
ABO/RH(D): O POS
Antibody Screen: NEGATIVE

## 2020-09-06 ENCOUNTER — Ambulatory Visit: Payer: Medicare HMO | Admitting: Urgent Care

## 2020-09-06 ENCOUNTER — Encounter
Admission: AD | Disposition: A | Discharge status: Home Health | Payer: Self-pay | Source: Home / Self Care | Attending: Orthopedic Surgery

## 2020-09-06 ENCOUNTER — Encounter: Payer: Self-pay | Admitting: Orthopedic Surgery

## 2020-09-06 ENCOUNTER — Other Ambulatory Visit: Payer: Self-pay

## 2020-09-06 ENCOUNTER — Inpatient Hospital Stay
Admission: AD | Admit: 2020-09-06 | Discharge: 2020-09-12 | DRG: 470 | Disposition: A | Discharge status: Home Health | Payer: Medicare HMO | Attending: Orthopedic Surgery | Admitting: Orthopedic Surgery

## 2020-09-06 ENCOUNTER — Ambulatory Visit: Payer: Medicare HMO

## 2020-09-06 ENCOUNTER — Observation Stay: Payer: Medicare HMO

## 2020-09-06 DIAGNOSIS — K567 Ileus, unspecified: Secondary | ICD-10-CM | POA: Diagnosis not present

## 2020-09-06 DIAGNOSIS — Z8371 Family history of colonic polyps: Secondary | ICD-10-CM

## 2020-09-06 DIAGNOSIS — K219 Gastro-esophageal reflux disease without esophagitis: Secondary | ICD-10-CM | POA: Diagnosis present

## 2020-09-06 DIAGNOSIS — M1711 Unilateral primary osteoarthritis, right knee: Principal | ICD-10-CM | POA: Diagnosis present

## 2020-09-06 DIAGNOSIS — Z419 Encounter for procedure for purposes other than remedying health state, unspecified: Secondary | ICD-10-CM

## 2020-09-06 DIAGNOSIS — Z888 Allergy status to other drugs, medicaments and biological substances status: Secondary | ICD-10-CM

## 2020-09-06 DIAGNOSIS — Z8616 Personal history of COVID-19: Secondary | ICD-10-CM

## 2020-09-06 DIAGNOSIS — E1151 Type 2 diabetes mellitus with diabetic peripheral angiopathy without gangrene: Secondary | ICD-10-CM | POA: Diagnosis present

## 2020-09-06 DIAGNOSIS — Z7984 Long term (current) use of oral hypoglycemic drugs: Secondary | ICD-10-CM

## 2020-09-06 DIAGNOSIS — Z8249 Family history of ischemic heart disease and other diseases of the circulatory system: Secondary | ICD-10-CM

## 2020-09-06 DIAGNOSIS — E785 Hyperlipidemia, unspecified: Secondary | ICD-10-CM | POA: Diagnosis present

## 2020-09-06 DIAGNOSIS — D509 Iron deficiency anemia, unspecified: Secondary | ICD-10-CM | POA: Diagnosis present

## 2020-09-06 DIAGNOSIS — I10 Essential (primary) hypertension: Secondary | ICD-10-CM | POA: Diagnosis present

## 2020-09-06 DIAGNOSIS — G459 Transient cerebral ischemic attack, unspecified: Secondary | ICD-10-CM | POA: Diagnosis present

## 2020-09-06 DIAGNOSIS — Z85820 Personal history of malignant melanoma of skin: Secondary | ICD-10-CM

## 2020-09-06 DIAGNOSIS — K5900 Constipation, unspecified: Secondary | ICD-10-CM | POA: Diagnosis not present

## 2020-09-06 DIAGNOSIS — Z87891 Personal history of nicotine dependence: Secondary | ICD-10-CM

## 2020-09-06 DIAGNOSIS — M25761 Osteophyte, right knee: Secondary | ICD-10-CM | POA: Diagnosis present

## 2020-09-06 DIAGNOSIS — Z96651 Presence of right artificial knee joint: Principal | ICD-10-CM

## 2020-09-06 DIAGNOSIS — I2510 Atherosclerotic heart disease of native coronary artery without angina pectoris: Secondary | ICD-10-CM | POA: Diagnosis present

## 2020-09-06 DIAGNOSIS — Z880 Allergy status to penicillin: Secondary | ICD-10-CM

## 2020-09-06 DIAGNOSIS — Z9842 Cataract extraction status, left eye: Secondary | ICD-10-CM

## 2020-09-06 DIAGNOSIS — Z961 Presence of intraocular lens: Secondary | ICD-10-CM | POA: Diagnosis present

## 2020-09-06 DIAGNOSIS — G4733 Obstructive sleep apnea (adult) (pediatric): Secondary | ICD-10-CM | POA: Diagnosis present

## 2020-09-06 DIAGNOSIS — Z8673 Personal history of transient ischemic attack (TIA), and cerebral infarction without residual deficits: Secondary | ICD-10-CM

## 2020-09-06 DIAGNOSIS — E871 Hypo-osmolality and hyponatremia: Secondary | ICD-10-CM | POA: Diagnosis present

## 2020-09-06 DIAGNOSIS — E119 Type 2 diabetes mellitus without complications: Secondary | ICD-10-CM | POA: Diagnosis present

## 2020-09-06 DIAGNOSIS — F419 Anxiety disorder, unspecified: Secondary | ICD-10-CM | POA: Diagnosis present

## 2020-09-06 DIAGNOSIS — Z800 Family history of malignant neoplasm of digestive organs: Secondary | ICD-10-CM

## 2020-09-06 DIAGNOSIS — Z9841 Cataract extraction status, right eye: Secondary | ICD-10-CM

## 2020-09-06 DIAGNOSIS — J45909 Unspecified asthma, uncomplicated: Secondary | ICD-10-CM | POA: Diagnosis present

## 2020-09-06 HISTORY — PX: TOTAL KNEE ARTHROPLASTY: SHX125

## 2020-09-06 LAB — GLUCOSE, CAPILLARY
Glucose-Capillary: 188 mg/dL — ABNORMAL HIGH (ref 70–99)
Glucose-Capillary: 168 mg/dL — ABNORMAL HIGH (ref 70–99)

## 2020-09-06 SURGERY — ARTHROPLASTY, KNEE, TOTAL
Anesthesia: General | Site: Knee | Laterality: Right

## 2020-09-06 MED ORDER — SODIUM CHLORIDE 0.9 % IV SOLN: INTRAVENOUS | Status: DC

## 2020-09-06 MED ORDER — GLIPIZIDE ER 10 MG PO TB24
10.0000 mg | ORAL_TABLET | Freq: Every day | ORAL | Status: DC
  Administered 2020-09-07 – 2020-09-11 (×5): 10 mg via ORAL
  Filled 2020-09-06 (×5): qty 1

## 2020-09-06 MED ORDER — ALUM & MAG HYDROXIDE-SIMETH 200-200-20 MG/5ML PO SUSP
30.0000 mL | ORAL | Status: DC | PRN
  Administered 2020-09-06 – 2020-09-07 (×3): 30 mL via ORAL
  Filled 2020-09-06 (×3): qty 30

## 2020-09-06 MED ORDER — ONDANSETRON HCL 4 MG/2ML IJ SOLN
INTRAMUSCULAR | Status: DC | PRN
  Administered 2020-09-06 (×2): 4 mg via INTRAVENOUS

## 2020-09-06 MED ORDER — METOCLOPRAMIDE HCL 5 MG/ML IJ SOLN: 5.0000 mg | Freq: Three times a day (TID) | INTRAMUSCULAR | Status: DC | PRN

## 2020-09-06 MED ORDER — ACETAMINOPHEN 500 MG PO TABS
500.0000 mg | ORAL_TABLET | Freq: Four times a day (QID) | ORAL | Status: AC
  Administered 2020-09-06 – 2020-09-07 (×4): 500 mg via ORAL
  Filled 2020-09-06 (×4): qty 1

## 2020-09-06 MED ORDER — PANTOPRAZOLE SODIUM 20 MG PO TBEC
20.0000 mg | DELAYED_RELEASE_TABLET | Freq: Every morning | ORAL | Status: DC
  Administered 2020-09-07 – 2020-09-12 (×6): 20 mg via ORAL
  Filled 2020-09-06 (×6): qty 1

## 2020-09-06 MED ORDER — SENNOSIDES-DOCUSATE SODIUM 8.6-50 MG PO TABS
1.0000 | ORAL_TABLET | ORAL | Status: DC
  Administered 2020-09-06 – 2020-09-10 (×3): 1 via ORAL
  Filled 2020-09-06 (×5): qty 1

## 2020-09-06 MED ORDER — OXYCODONE HCL 5 MG PO TABS
ORAL_TABLET | ORAL | Status: AC
  Filled 2020-09-06: qty 1

## 2020-09-06 MED ORDER — ROCURONIUM BROMIDE 100 MG/10ML IV SOLN
INTRAVENOUS | Status: DC | PRN
  Administered 2020-09-06: 20 mg via INTRAVENOUS
  Administered 2020-09-06: 50 mg via INTRAVENOUS
  Administered 2020-09-06: 10 mg via INTRAVENOUS

## 2020-09-06 MED ORDER — OXYCODONE HCL 5 MG PO TABS
5.0000 mg | ORAL_TABLET | Freq: Once | ORAL | Status: AC | PRN
  Administered 2020-09-06: 5 mg via ORAL

## 2020-09-06 MED ORDER — METOCLOPRAMIDE HCL 10 MG PO TABS
5.0000 mg | ORAL_TABLET | Freq: Three times a day (TID) | ORAL | Status: DC | PRN
Start: 2020-09-06 — End: 2020-09-12

## 2020-09-06 MED ORDER — FENTANYL CITRATE (PF) 100 MCG/2ML IJ SOLN
INTRAMUSCULAR | Status: AC
  Administered 2020-09-06: 25 ug via INTRAVENOUS
  Filled 2020-09-06: qty 2

## 2020-09-06 MED ORDER — PROPOFOL 10 MG/ML IV BOLUS
INTRAVENOUS | Status: DC | PRN
  Administered 2020-09-06: 170 mg via INTRAVENOUS

## 2020-09-06 MED ORDER — LACTATED RINGERS IV SOLN: INTRAVENOUS | Status: DC

## 2020-09-06 MED ORDER — HYDROCODONE-ACETAMINOPHEN 5-325 MG PO TABS
1.0000 | ORAL_TABLET | ORAL | Status: DC | PRN
  Administered 2020-09-06: 2 via ORAL
  Administered 2020-09-08: 1 via ORAL
  Administered 2020-09-09 (×3): 2 via ORAL
  Administered 2020-09-10: 1 via ORAL
  Administered 2020-09-10: 2 via ORAL
  Administered 2020-09-12 (×2): 1 via ORAL
  Filled 2020-09-06 (×5): qty 2
  Filled 2020-09-06 (×4): qty 1

## 2020-09-06 MED ORDER — BUPIVACAINE HCL (PF) 0.5 % IJ SOLN
INTRAMUSCULAR | Status: AC
  Filled 2020-09-06: qty 20

## 2020-09-06 MED ORDER — MORPHINE SULFATE (PF) 2 MG/ML IV SOLN
0.5000 mg | INTRAVENOUS | Status: DC | PRN
  Administered 2020-09-07: 1 mg via INTRAVENOUS
  Filled 2020-09-06: qty 1

## 2020-09-06 MED ORDER — POVIDONE-IODINE 10 % EX SWAB: 2.0000 "application " | Freq: Once | CUTANEOUS | Status: DC

## 2020-09-06 MED ORDER — ONDANSETRON HCL 4 MG PO TABS: 4.0000 mg | ORAL_TABLET | Freq: Four times a day (QID) | ORAL | Status: DC | PRN

## 2020-09-06 MED ORDER — METFORMIN HCL 500 MG PO TABS
1000.0000 mg | ORAL_TABLET | Freq: Two times a day (BID) | ORAL | Status: DC
  Administered 2020-09-06 – 2020-09-11 (×10): 1000 mg via ORAL
  Filled 2020-09-06 (×10): qty 2

## 2020-09-06 MED ORDER — CEFAZOLIN SODIUM-DEXTROSE 2-4 GM/100ML-% IV SOLN
2.0000 g | Freq: Four times a day (QID) | INTRAVENOUS | Status: AC
  Administered 2020-09-06 (×2): 2 g via INTRAVENOUS
  Filled 2020-09-06 (×2): qty 100

## 2020-09-06 MED ORDER — CLINDAMYCIN PHOSPHATE 900 MG/50ML IV SOLN
900.0000 mg | INTRAVENOUS | Status: AC
  Administered 2020-09-06: 900 mg via INTRAVENOUS

## 2020-09-06 MED ORDER — HYDROMORPHONE HCL 1 MG/ML IJ SOLN
0.2500 mg | INTRAMUSCULAR | Status: DC | PRN
  Administered 2020-09-06: 0.25 mg via INTRAVENOUS

## 2020-09-06 MED ORDER — LIDOCAINE HCL (CARDIAC) PF 100 MG/5ML IV SOSY
PREFILLED_SYRINGE | INTRAVENOUS | Status: DC | PRN
  Administered 2020-09-06: 100 mg via INTRAVENOUS

## 2020-09-06 MED ORDER — VITAMIN B-12 1000 MCG PO TABS
1000.0000 ug | ORAL_TABLET | Freq: Every day | ORAL | Status: DC
  Administered 2020-09-07 – 2020-09-12 (×6): 1000 ug via ORAL
  Filled 2020-09-06 (×6): qty 1

## 2020-09-06 MED ORDER — BUPIVACAINE-EPINEPHRINE (PF) 0.5% -1:200000 IJ SOLN
INTRAMUSCULAR | Status: AC
  Filled 2020-09-06: qty 30

## 2020-09-06 MED ORDER — FENTANYL CITRATE (PF) 100 MCG/2ML IJ SOLN
INTRAMUSCULAR | Status: DC | PRN
  Administered 2020-09-06 (×4): 50 ug via INTRAVENOUS

## 2020-09-06 MED ORDER — AMLODIPINE BESYLATE 10 MG PO TABS
10.0000 mg | ORAL_TABLET | ORAL | Status: DC
  Administered 2020-09-07 – 2020-09-12 (×6): 10 mg via ORAL
  Filled 2020-09-06 (×6): qty 1

## 2020-09-06 MED ORDER — ACETAMINOPHEN 10 MG/ML IV SOLN
INTRAVENOUS | Status: DC | PRN
  Administered 2020-09-06: 1000 mg via INTRAVENOUS

## 2020-09-06 MED ORDER — ORAL CARE MOUTH RINSE: 15.0000 mL | Freq: Once | OROMUCOSAL | Status: AC

## 2020-09-06 MED ORDER — FENTANYL CITRATE (PF) 100 MCG/2ML IJ SOLN
INTRAMUSCULAR | Status: AC
  Administered 2020-09-06: 50 ug via INTRAVENOUS
  Filled 2020-09-06: qty 2

## 2020-09-06 MED ORDER — OXYCODONE HCL 5 MG/5ML PO SOLN: 5.0000 mg | Freq: Once | ORAL | Status: AC | PRN

## 2020-09-06 MED ORDER — DOCUSATE SODIUM 100 MG PO CAPS
100.0000 mg | ORAL_CAPSULE | Freq: Two times a day (BID) | ORAL | Status: DC
  Administered 2020-09-06 – 2020-09-11 (×10): 100 mg via ORAL
  Filled 2020-09-06 (×10): qty 1

## 2020-09-06 MED ORDER — CLOPIDOGREL BISULFATE 75 MG PO TABS
75.0000 mg | ORAL_TABLET | Freq: Every day | ORAL | Status: DC
  Administered 2020-09-07 – 2020-09-09 (×3): 75 mg via ORAL
  Filled 2020-09-06 (×3): qty 1

## 2020-09-06 MED ORDER — PRAVASTATIN SODIUM 20 MG PO TABS
80.0000 mg | ORAL_TABLET | Freq: Every day | ORAL | Status: DC
  Administered 2020-09-06 – 2020-09-11 (×6): 80 mg via ORAL
  Filled 2020-09-06 (×6): qty 4

## 2020-09-06 MED ORDER — ONDANSETRON HCL 4 MG/2ML IJ SOLN
4.0000 mg | Freq: Four times a day (QID) | INTRAMUSCULAR | Status: DC | PRN
  Filled 2020-09-06 (×2): qty 2

## 2020-09-06 MED ORDER — BUPIVACAINE HCL (PF) 0.5 % IJ SOLN
INTRAMUSCULAR | Status: DC | PRN
  Administered 2020-09-06: 13 mL via PERINEURAL
  Administered 2020-09-06: 7 mL via PERINEURAL

## 2020-09-06 MED ORDER — FENTANYL CITRATE (PF) 100 MCG/2ML IJ SOLN
INTRAMUSCULAR | Status: AC
  Filled 2020-09-06: qty 4

## 2020-09-06 MED ORDER — EPHEDRINE SULFATE 50 MG/ML IJ SOLN
INTRAMUSCULAR | Status: DC | PRN
  Administered 2020-09-06: 10 mg via INTRAVENOUS

## 2020-09-06 MED ORDER — FERROUS SULFATE 325 (65 FE) MG PO TABS
325.0000 mg | ORAL_TABLET | Freq: Every day | ORAL | Status: DC
  Administered 2020-09-07 – 2020-09-12 (×6): 325 mg via ORAL
  Filled 2020-09-06 (×6): qty 1

## 2020-09-06 MED ORDER — BISACODYL 10 MG RE SUPP
10.0000 mg | Freq: Every day | RECTAL | Status: DC | PRN
  Administered 2020-09-08: 10 mg via RECTAL
  Filled 2020-09-06 (×2): qty 1

## 2020-09-06 MED ORDER — MENTHOL 3 MG MT LOZG
1.0000 | LOZENGE | OROMUCOSAL | Status: DC | PRN
  Filled 2020-09-06: qty 9

## 2020-09-06 MED ORDER — CHLORHEXIDINE GLUCONATE 0.12 % MT SOLN
OROMUCOSAL | Status: AC
  Administered 2020-09-06: 15 mL via OROMUCOSAL
  Filled 2020-09-06: qty 15

## 2020-09-06 MED ORDER — BUPIVACAINE LIPOSOME 1.3 % IJ SUSP
INTRAMUSCULAR | Status: AC
  Filled 2020-09-06: qty 20

## 2020-09-06 MED ORDER — TRANEXAMIC ACID-NACL 1000-0.7 MG/100ML-% IV SOLN
INTRAVENOUS | Status: AC
  Filled 2020-09-06: qty 100

## 2020-09-06 MED ORDER — MAGNESIUM CITRATE PO SOLN
1.0000 | Freq: Once | ORAL | Status: AC | PRN
  Administered 2020-09-11: 1 via ORAL
  Filled 2020-09-06 (×2): qty 296

## 2020-09-06 MED ORDER — HYDROCODONE-ACETAMINOPHEN 7.5-325 MG PO TABS
1.0000 | ORAL_TABLET | ORAL | Status: DC | PRN
  Administered 2020-09-07 (×2): 2 via ORAL
  Administered 2020-09-08 – 2020-09-10 (×2): 1 via ORAL
  Filled 2020-09-06 (×2): qty 2
  Filled 2020-09-06: qty 1
  Filled 2020-09-06: qty 2
  Filled 2020-09-06: qty 1

## 2020-09-06 MED ORDER — SODIUM CHLORIDE 0.9 % IV SOLN
INTRAVENOUS | Status: DC | PRN
  Administered 2020-09-06: 60 mL

## 2020-09-06 MED ORDER — HYDROMORPHONE HCL 1 MG/ML IJ SOLN
INTRAMUSCULAR | Status: AC
  Administered 2020-09-06: 0.25 mg via INTRAVENOUS
  Filled 2020-09-06: qty 1

## 2020-09-06 MED ORDER — HYDROCHLOROTHIAZIDE 25 MG PO TABS
25.0000 mg | ORAL_TABLET | Freq: Every day | ORAL | Status: DC
  Administered 2020-09-07 – 2020-09-11 (×5): 25 mg via ORAL
  Filled 2020-09-06 (×5): qty 1

## 2020-09-06 MED ORDER — SUGAMMADEX SODIUM 200 MG/2ML IV SOLN
INTRAVENOUS | Status: DC | PRN
  Administered 2020-09-06: 200 mg via INTRAVENOUS

## 2020-09-06 MED ORDER — TRANEXAMIC ACID-NACL 1000-0.7 MG/100ML-% IV SOLN
1000.0000 mg | INTRAVENOUS | Status: AC
  Administered 2020-09-06: 1000 mg via INTRAVENOUS

## 2020-09-06 MED ORDER — MIDAZOLAM HCL 2 MG/2ML IJ SOLN: 1.0000 mg | Freq: Once | INTRAMUSCULAR | Status: AC

## 2020-09-06 MED ORDER — ACETAMINOPHEN 10 MG/ML IV SOLN
INTRAVENOUS | Status: AC
  Filled 2020-09-06: qty 100

## 2020-09-06 MED ORDER — SODIUM CHLORIDE FLUSH 0.9 % IV SOLN
INTRAVENOUS | Status: AC
  Filled 2020-09-06: qty 40

## 2020-09-06 MED ORDER — BUPIVACAINE-EPINEPHRINE (PF) 0.5% -1:200000 IJ SOLN
INTRAMUSCULAR | Status: DC | PRN
  Administered 2020-09-06: 30 mL

## 2020-09-06 MED ORDER — MIDAZOLAM HCL 2 MG/2ML IJ SOLN
INTRAMUSCULAR | Status: AC
  Filled 2020-09-06: qty 2

## 2020-09-06 MED ORDER — LIDOCAINE HCL (PF) 1 % IJ SOLN
INTRAMUSCULAR | Status: AC
  Filled 2020-09-06: qty 5

## 2020-09-06 MED ORDER — ACETAMINOPHEN 325 MG PO TABS
325.0000 mg | ORAL_TABLET | Freq: Four times a day (QID) | ORAL | Status: DC | PRN
  Administered 2020-09-12: 650 mg via ORAL
  Filled 2020-09-06: qty 2

## 2020-09-06 MED ORDER — CLINDAMYCIN PHOSPHATE 900 MG/50ML IV SOLN
INTRAVENOUS | Status: AC
  Filled 2020-09-06: qty 50

## 2020-09-06 MED ORDER — SUCCINYLCHOLINE CHLORIDE 20 MG/ML IJ SOLN
INTRAMUSCULAR | Status: DC | PRN
  Administered 2020-09-06: 120 mg via INTRAVENOUS

## 2020-09-06 MED ORDER — DEXAMETHASONE SODIUM PHOSPHATE 10 MG/ML IJ SOLN
INTRAMUSCULAR | Status: DC | PRN
  Administered 2020-09-06: 10 mg via INTRAVENOUS

## 2020-09-06 MED ORDER — GLYCOPYRROLATE 0.2 MG/ML IJ SOLN
INTRAMUSCULAR | Status: DC | PRN
  Administered 2020-09-06: .2 mg via INTRAVENOUS

## 2020-09-06 MED ORDER — DIPHENHYDRAMINE HCL 12.5 MG/5ML PO ELIX: 12.5000 mg | ORAL_SOLUTION | ORAL | Status: DC | PRN

## 2020-09-06 MED ORDER — MIDAZOLAM HCL 2 MG/2ML IJ SOLN
INTRAMUSCULAR | Status: AC
  Administered 2020-09-06: 1 mg via INTRAVENOUS
  Filled 2020-09-06: qty 2

## 2020-09-06 MED ORDER — CHLORHEXIDINE GLUCONATE 0.12 % MT SOLN: 15.0000 mL | Freq: Once | OROMUCOSAL | Status: AC

## 2020-09-06 MED ORDER — TRAMADOL HCL 50 MG PO TABS
50.0000 mg | ORAL_TABLET | Freq: Four times a day (QID) | ORAL | Status: DC
  Administered 2020-09-06 – 2020-09-11 (×18): 50 mg via ORAL
  Filled 2020-09-06 (×18): qty 1

## 2020-09-06 MED ORDER — FENTANYL CITRATE (PF) 100 MCG/2ML IJ SOLN
25.0000 ug | INTRAMUSCULAR | Status: DC | PRN
  Administered 2020-09-06 (×2): 50 ug via INTRAVENOUS
  Administered 2020-09-06: 25 ug via INTRAVENOUS

## 2020-09-06 MED ORDER — MIDAZOLAM HCL 2 MG/2ML IJ SOLN
INTRAMUSCULAR | Status: DC | PRN
  Administered 2020-09-06: 2 mg via INTRAVENOUS

## 2020-09-06 MED ORDER — PHENOL 1.4 % MT LIQD
1.0000 | OROMUCOSAL | Status: DC | PRN
  Filled 2020-09-06: qty 177

## 2020-09-06 SURGICAL SUPPLY — 61 items
BASEPLATE TIBIAL SZ 6 (Knees) ×1 IMPLANT
BLADE SAGITTAL AGGR TOOTH XLG (BLADE) ×2 IMPLANT
BLADE SAW SAG 25X90X1.19 (BLADE) ×2 IMPLANT
BOWL CEMENT MIX W/ADAPTER (MISCELLANEOUS) ×2 IMPLANT
BRUSH SCRUB EZ  4% CHG (MISCELLANEOUS) ×1
BRUSH SCRUB EZ 4% CHG (MISCELLANEOUS) ×1 IMPLANT
CANISTER SUCT 1200ML W/VALVE (MISCELLANEOUS) ×2 IMPLANT
CEMENT BONE 40GM (Cement) ×4 IMPLANT
CHLORAPREP W/TINT 26 (MISCELLANEOUS) ×4 IMPLANT
COMP PATELLA GENESIS 35MM (Stem) ×2 IMPLANT
COMPONENT PATELLA GENESIS 35MM (Stem) ×1 IMPLANT
COOLER POLAR GLACIER W/PUMP (MISCELLANEOUS) ×2 IMPLANT
COVER WAND RF STERILE (DRAPES) ×2 IMPLANT
CUFF TOURN SGL QUICK 30 (TOURNIQUET CUFF) ×1
CUFF TRNQT CYL 30X4X21-28X (TOURNIQUET CUFF) ×1 IMPLANT
DRAPE 3/4 80X56 (DRAPES) ×4 IMPLANT
DRAPE INCISE IOBAN 66X60 STRL (DRAPES) ×2 IMPLANT
ELECT REM PT RETURN 9FT ADLT (ELECTROSURGICAL) ×2
ELECTRODE REM PT RTRN 9FT ADLT (ELECTROSURGICAL) ×1 IMPLANT
FEM COMP OXINUM SZ7 RT (Knees) ×2 IMPLANT
FEMORAL COMP OXINUM SZ7 RT (Knees) ×1 IMPLANT
GAUZE SPONGE 4X4 12PLY STRL (GAUZE/BANDAGES/DRESSINGS) ×2 IMPLANT
GAUZE XEROFORM 1X8 LF (GAUZE/BANDAGES/DRESSINGS) ×2 IMPLANT
GLOVE SURG ENC MOIS LTX SZ8 (GLOVE) ×2 IMPLANT
GLOVE SURG ORTHO LTX SZ8 (GLOVE) ×6 IMPLANT
GLOVE SURG UNDER LTX SZ8 (GLOVE) ×2 IMPLANT
GLOVE SURG UNDER POLY LF SZ8.5 (GLOVE) ×2 IMPLANT
GOWN STRL REUS W/ TWL LRG LVL3 (GOWN DISPOSABLE) ×1 IMPLANT
GOWN STRL REUS W/ TWL XL LVL3 (GOWN DISPOSABLE) ×1 IMPLANT
GOWN STRL REUS W/TWL LRG LVL3 (GOWN DISPOSABLE) ×1
GOWN STRL REUS W/TWL XL LVL3 (GOWN DISPOSABLE) ×1
HOOD PEEL AWAY FLYTE STAYCOOL (MISCELLANEOUS) ×4 IMPLANT
INSERT ART HI FLEX 9 SZ5-6 (Insert) ×2 IMPLANT
IRRIGATION SURGIPHOR STRL (IV SOLUTION) ×2 IMPLANT
IV NS 1000ML (IV SOLUTION) ×1
IV NS 1000ML BAXH (IV SOLUTION) ×1 IMPLANT
KIT TURNOVER KIT A (KITS) ×2 IMPLANT
MANIFOLD NEPTUNE II (INSTRUMENTS) ×2 IMPLANT
MAT ABSORB  FLUID 56X50 GRAY (MISCELLANEOUS) ×1
MAT ABSORB FLUID 56X50 GRAY (MISCELLANEOUS) ×1 IMPLANT
NDL SAFETY ECLIPSE 18X1.5 (NEEDLE) ×1 IMPLANT
NEEDLE HYPO 18GX1.5 SHARP (NEEDLE) ×1
NEEDLE SPNL 20GX3.5 QUINCKE YW (NEEDLE) ×2 IMPLANT
NS IRRIG 1000ML POUR BTL (IV SOLUTION) ×2 IMPLANT
PACK TOTAL KNEE (MISCELLANEOUS) ×2 IMPLANT
PAD DE MAYO PRESSURE PROTECT (MISCELLANEOUS) ×2 IMPLANT
PAD WRAPON POLAR KNEE (MISCELLANEOUS) ×1 IMPLANT
PULSAVAC PLUS IRRIG FAN TIP (DISPOSABLE) ×2
STAPLER SKIN PROX 35W (STAPLE) ×2 IMPLANT
SUCTION FRAZIER HANDLE 10FR (MISCELLANEOUS) ×1
SUCTION TUBE FRAZIER 10FR DISP (MISCELLANEOUS) ×1 IMPLANT
SUT DVC 2 QUILL PDO  T11 36X36 (SUTURE) ×1
SUT DVC 2 QUILL PDO T11 36X36 (SUTURE) ×1 IMPLANT
SUT VIC AB 2-0 CT1 18 (SUTURE) ×2 IMPLANT
SUT VIC AB 2-0 CT1 27 (SUTURE)
SUT VIC AB 2-0 CT1 TAPERPNT 27 (SUTURE) IMPLANT
SUT VIC AB PLUS 45CM 1-MO-4 (SUTURE) ×2 IMPLANT
SYR 30ML LL (SYRINGE) ×6 IMPLANT
TIBIAL BASEPLATE SZ 6 (Knees) ×2 IMPLANT
TIP FAN IRRIG PULSAVAC PLUS (DISPOSABLE) ×1 IMPLANT
WRAPON POLAR PAD KNEE (MISCELLANEOUS) ×2

## 2020-09-06 NOTE — Anesthesia Procedure Notes (Signed)
Anesthesia Regional Block: Adductor canal block   Pre-Anesthetic Checklist: ,, timeout performed, Correct Patient, Correct Site, Correct Laterality, Correct Procedure, Correct Position, site marked, Risks and benefits discussed,  Surgical consent,  Pre-op evaluation,  At surgeon's request and post-op pain management  Laterality: Lower and Right  Prep: chloraprep       Needles:  Injection technique: Single-shot  Needle Type: Echogenic Needle     Needle Length: 9cm  Needle Gauge: 21     Additional Needles:   Procedures:,,,, ultrasound used (permanent image in chart),,,,  Narrative:  Start time: 09/06/2020 10:04 AM End time: 09/06/2020 10:06 AM Injection made incrementally with aspirations every 5 mL.  Performed by: Personally  Anesthesiologist: Briauna Gilmartin, Precious Haws, MD  Additional Notes: Patient consented for risk and benefits of nerve block including but not limited to nerve damage, failed block, bleeding and infection.  Patient voiced understanding.  Functioning IV was confirmed and monitors were applied.  Timeout done prior to procedure and prior to any sedation being given to the patient.  Patient confirmed procedure site prior to any sedation given to the patient.  A 89mm 22ga Stimuplex needle was used. Sterile prep,hand hygiene and sterile gloves were used.  Minimal sedation used for procedure.  No paresthesia endorsed by patient during the procedure.  Negative aspiration and negative test dose prior to incremental administration of local anesthetic. The patient tolerated the procedure well with no immediate complications.

## 2020-09-06 NOTE — Anesthesia Preprocedure Evaluation (Signed)
Anesthesia Evaluation  Patient identified by MRN, date of birth, ID band Patient awake    Reviewed: Allergy & Precautions, H&P , NPO status , Patient's Chart, lab work & pertinent test results  History of Anesthesia Complications (+) DIFFICULT AIRWAY and history of anesthetic complications  Airway Mallampati: III  TM Distance: <3 FB Neck ROM: limited    Dental  (+) Chipped   Pulmonary neg shortness of breath, asthma , sleep apnea , former smoker,    Pulmonary exam normal        Cardiovascular Exercise Tolerance: Good hypertension, (-) angina+ CAD and + Peripheral Vascular Disease  (-) Past MI Normal cardiovascular exam     Neuro/Psych  Headaches, PSYCHIATRIC DISORDERS TIA Neuromuscular disease    GI/Hepatic Neg liver ROS, hiatal hernia, GERD  Medicated and Controlled,  Endo/Other  diabetes, Type 2  Renal/GU      Musculoskeletal  (+) Arthritis ,   Abdominal   Peds  Hematology negative hematology ROS (+)   Anesthesia Other Findings Past Medical History: No date: Anemia No date: Anxiety No date: Arthritis No date: Asthma     Comment:  WELL CONTROLLED, had as a child No date: CAD (coronary artery disease)     Comment:  MANAGING WITH MEDICINE-NO STENTS No date: Cancer ([place])     Comment:  MELANOMA (NOSE, FACE AND HANDS), BASAL CELL No date: Cervicalgia No date: Depression No date: Diabetes mellitus without complication (HCC) No date: Difficult intubation     Comment:  damage to throat No date: Diverticulitis No date: Dyspnea No date: GERD (gastroesophageal reflux disease) No date: Headache No date: Hernia of abdominal cavity 12/2019: History of 2019 novel coronavirus disease (COVID-19) No date: History of hiatal hernia No date: Hyperlipidemia No date: Hypertension No date: PVD (peripheral vascular disease) ([place]) No date: Sleep apnea     Comment:  NO CPAP-PT DENIES SLEEP APNEA BUT STUDY WAS DONE AND PT                WAS TOLD HE HAD SLEEP APNEA 03/19/2020: TIA (transient ischemic attack)  Past Surgical History: No date: [place], 1996: BACK SURGERY     Comment:  X3 No date: CARDIAC CATHETERIZATION No date: CARPAL TUNNEL RELEASE; Bilateral 04/03/2016: CATARACT EXTRACTION W/PHACO; Right     Comment:  Procedure: CATARACT EXTRACTION PHACO AND INTRAOCULAR               LENS PLACEMENT ([place]);  Surgeon: Estill Cotta, MD;                Location: [facility] ORS;  Service: Ophthalmology;  Laterality:              Right;  [place] 1.37 AP% 25.8 CDE 44.67 Fluid pack lot #               [id number] 05/29/2016: CATARACT EXTRACTION W/PHACO; Left     Comment:  Procedure: CATARACT EXTRACTION PHACO AND INTRAOCULAR               LENS PLACEMENT ([facility]);  Surgeon: Estill Cotta, MD;                Location: [facility] ORS;  Service: Ophthalmology;  Laterality:              Left;  [place] 01:16 AP% 23.3 CDE 31.18 Fluid pack lot #               [id number] No date: COLONOSCOPY 01/15/2017: COLONOSCOPY WITH PROPOFOL; N/A  Comment:  Procedure: COLONOSCOPY WITH PROPOFOL;  Surgeon: Manya Silvas, MD;  Location[facility] ENDOSCOPY;  Service:               Endoscopy;  Laterality: N/A; No date: ESOPHAGOGASTRODUODENOSCOPY 01/15/2017: ESOPHAGOGASTRODUODENOSCOPY (EGD) WITH PROPOFOL; N/A     Comment:  Procedure: ESOPHAGOGASTRODUODENOSCOPY (EGD) WITH               PROPOFOL;  Surgeon: Manya Silvas, MD;  Location:               [facility] ENDOSCOPY;  Service: Endoscopy;  Laterality: N/A; 05/19/2019: INGUINAL HERNIA REPAIR; Right     Comment:  Procedure: HERNIA REPAIR INGUINAL ADULT;  Surgeon:               Robert Bellow, MD;  Location: [facility] ORS;  Service:               General;  Laterality: Right; 06/21/2020: INGUINAL HERNIA REPAIR; Left     Comment:  Procedure: HERNIA REPAIR INGUINAL ADULT;  Surgeon:               Robert Bellow, MD;  Location: [facility] ORS;  Service:               General;   Laterality: Left; 08/30/2015: KNEE ARTHROSCOPY; Left     Comment:  Procedure: ARTHROSCOPY KNEE LEFT, partial medial               menisectomy, chondral debridement;  Surgeon: Leanor Kail, MD;  Location: [facility] ORS;  Service: Orthopedics;              Laterality: Left; 11/24/2017: LEFT HEART CATH AND CORONARY ANGIOGRAPHY; Left     Comment:  Procedure: LEFT HEART CATH AND CORONARY ANGIOGRAPHY;                Surgeon: Corey Skains, MD;  Location: [place]               CV LAB;  Service: Cardiovascular;  Laterality: Left; No date: MOHS SURGERY No date: POLYPECTOMY No date: ROTATOR CUFF REPAIR; Right No date: TRIGGER FINGER RELEASE  BMI    Body Mass Index: 27.67 kg/m      Reproductive/Obstetrics negative OB ROS                             Anesthesia Physical Anesthesia Plan  ASA: III  Anesthesia Plan: General ETT   Post-op Pain Management:    Induction: Intravenous  PONV Risk Score and Plan: Ondansetron, Dexamethasone, Midazolam and Treatment may vary due to age or medical condition  Airway Management Planned: Oral ETT and Video Laryngoscope Planned  Additional Equipment:   Intra-op Plan:   Post-operative Plan: Extubation in OR  Informed Consent: I have reviewed the patients History and Physical, chart, labs and discussed the procedure including the risks, benefits and alternatives for the proposed anesthesia with the patient or authorized representative who has indicated his/her understanding and acceptance.     Dental Advisory Given  Plan Discussed with: Anesthesiologist, CRNA and Surgeon  Anesthesia Plan Comments: (Patient declines spinal  Patient consented for risks of anesthesia including but not limited to:  - adverse reactions to medications - damage to eyes, teeth, lips or other oral mucosa - nerve damage due to  positioning  - sore throat or hoarseness - Damage to heart, brain, nerves, lungs, other parts of  body or loss of life  Patient voiced understanding.)        Anesthesia Quick Evaluation

## 2020-09-06 NOTE — H&P (Signed)
The patient has been re-examined, and the chart reviewed, and there have been no interval changes to the documented history and physical.  Plan a right total knee today.  Anesthesia is consulted regarding a peripheral nerve block for post-operative pain.  The risks, benefits, and alternatives have been discussed at length, and the patient is willing to proceed.     

## 2020-09-06 NOTE — Op Note (Signed)
DATE OF SURGERY:  09/06/2020 TIME: 12:51 PM  PATIENT NAME:  Connor Hall   AGE: 80 y.o.    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   PREOPERATIVE INDICATIONS:  Connor Hall is an 80 y.o. 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  Radiographs have demonstrated tricompartmental osteoarthritis joint space narrowing, osteophytes, subchondral sclerosis and cyst formation.  The risks, benefits, and alternatives were discussed at length including but not limited to the risks of infection, bleeding, nerve or blood vessel injury, knee stiffness, fracture, dislocation, loosening or failure of the hardware and the need for further surgery. Medical risks include but not limited to DVT and pulmonary embolism, myocardial infarction, stroke, pneumonia, respiratory failure and death. I discussed these risks with the patient in my office prior to the date of surgery. They understood these risks and were willing to proceed.  OPERATIVE FINDINGS AND UNIQUE ASPECTS OF THE CASE:  All three compartments with advanced and severe degenerative changes, large osteophytes and an abundance of synovial fluid. Significant deformity was also noted. A decision was made to proceed with total knee arthroplasty.   OPERATIVE DESCRIPTION:  The patient was brought to the  operative room and placed in a supine position after undergoing placement of a general anesthetic. IV antibiotics were given. Patient received tranexamic acid. The lower extremity was prepped and draped in the usual sterile fashion.  A time out was performed to verify the patient's name, date of birth, medical record number, correct site of surgery and correct procedure to be performed. The timeout was also used to confirm the patient received antibiotics and that appropriate instruments, implants and radiographs studies were available in the room.  The leg was elevated and exsanguinated with an Esmarch and the tourniquet was inflated to 250 mmHg.  A midline incision was made over the left knee.. A medial parapatellar arthrotomy was then made and the patella subluxed laterally and the knee was brought into 90 of flexion. Hoffa's fat pad along with the anterior cruciate ligament was resected and the medial joint line was exposed.  Attention was then turned to preparation of the patella. The thickness of the patella was measured with a caliper, the diameter measured with the patella templates.  The patella resection was then made with an oscillating saw using the patella cutting guide.  The 35 mm button fit appropriately.  3 peg holes for the patella component were then drilled.  The extramedullary tibial cutting guide was then placed using the anterior tibial crest and second ray of the foot as a reference.  The tibial cutting guide was adjusted to allow for appropriate posterior slope.  The tibial cutting block was pinned into position. The slotted stylus was used to measure the proximal tibial resection of 9 mm off the high lateral side. Care was taken during the tibial resection to protect the medial and collateral ligaments.  The resected tibial bone was removed.  The distal femur was resected using the Visionaire cutting guide.  Care was taken to protect the collateral ligaments during distal femoral  resection.  The distal femoral resection was performed with an oscillating saw. The femoral cutting guide was then removed. Extension gap was measured with a 9 mm spacer block and alignment and extension was confirmed using a long alignment rod. The femur was sized to be a 7. Rotation of the referencing guide was checked with the epicondylar axis and Whitesides line. Then the 4-in-1 cutting jig was then applied to the distal femur. A stylus was used to confirm that the anterior femur would not be notched.   Then the anterior, posterior and chamfer femoral cuts were then made with an oscillating saw.  The knee was distracted and all posterior osteophytes were removed.  The flexion gap was then measured with a flexion spacer block and long alignment rod and was found to be symmetric with the extension gap and perpendicular to mechanical axis of the tibia.  The proximal tibia plateau was then sized with trial trays. The best coverage was achieved with a size 6. This tibial tray was then pinned into position. The proximal tibia was then prepared with the keel punch.  After tibial preparation was completed, all trial components were inserted with polyethylene trials. The knee achieved full extension and flexed to 120 degrees. Ligament were stable to varus and valgus at full extension as well as 30, 60 and 90 degrees of flexion.   The trials were then placed. Knee was taken through a full range of motion and deemed to be stable with the trial components. All trial components were then removed.  The joint was copiously irrigated with pulse lavage.  The final total knee arthroplasty components were then cemented into place. The knee was held in extension while cement was allowed to cure.The knee was taken through a range of motion and the patella tracked well and the knee was again irrigated copiously.  The knee capsule was then injected with Exparel.  The medial arthrotomy was closed with #1 Vicryl and #2 Quill.  The subcutaneous tissue closed with  2-0 vicryl, and skin approximated with staples.  A dry sterile and compressive dressing was applied.  A Polar Care was applied to the operative knee.  The patient was awakened and brought to the PACU in stable and satisfactory condition.  All sharp, lap and instrument counts were correct at the conclusion the case. I spoke with the patient's family in the postop consultation room to let them know the case had been performed without complication and the patient was stable in recovery room.   Total tourniquet time was 46 minutes.

## 2020-09-06 NOTE — Anesthesia Postprocedure Evaluation (Signed)
Anesthesia Post Note  Patient: Connor Hall  Procedure(s) Performed: TOTAL KNEE ARTHROPLASTY (Right Knee)  Patient location during evaluation: PACU Anesthesia Type: General Level of consciousness: awake and alert Pain management: pain level controlled Vital Signs Assessment: post-procedure vital signs reviewed and stable Respiratory status: spontaneous breathing, nonlabored ventilation, respiratory function stable and patient connected to nasal cannula oxygen Cardiovascular status: blood pressure returned to baseline and stable Postop Assessment: no apparent nausea or vomiting Anesthetic complications: no   No complications documented.   Last Vitals:  Vitals:   09/06/20 1430 09/06/20 1445  BP: 137/71 140/65  Pulse: 82 66  Resp: 16 15  Temp: 36.6 C   SpO2: 99% 100%    Last Pain:  Vitals:   09/06/20 1445  TempSrc:   PainSc: Asleep                 Precious Haws [name]

## 2020-09-06 NOTE — Evaluation (Signed)
Physical Therapy Evaluation Patient Details Name: Connor Hall MRN: [id number] DOB: 1940/12/17 Today's Date: 09/06/2020   History of Present Illness  80 y/o male s/p R TKA 09/06/20.  Clinical Impression  Pt did well with POD0 PT exam and showed great effort and motivation.  He is lacking a few degrees of TKE which did effect his cadence during 45 ft of ambulation.  Pt educated on importance of knee extension positioning during prolonged stationary posture, pt showed good understanding of HEP.  He was able to engage quad and do SLRs, but again lacking full extension. Plan to introduce stair negotiation tomorrow and eect pt to be able to transition home per typical TKA protocol.      Follow Up Recommendations Follow surgeon's recommendation for DC plan and follow-up therapies    Equipment Recommendations   (may need BSC?)    Recommendations for Other Services       Precautions / Restrictions Precautions Precautions: Fall;Knee Precaution Booklet Issued: Yes (comment) (HEP) Restrictions Weight Bearing Restrictions: Yes RLE Weight Bearing: Weight bearing as tolerated      Mobility  Bed Mobility Overal bed mobility: Modified Independent             General bed mobility comments: slow and reliant on UEs, but able to get to sitting w/o assist    Transfers Overall transfer level: Needs assistance   Transfers: Sit to/from Stand Sit to Stand: From elevated surface;Min assist         General transfer comment: Pt showed good effort with getting to standing, light assist needed to complete transition to upright, heavy reliance on UEs  Ambulation/Gait Ambulation/Gait assistance: Modified independent (Device/Increase time) Gait Distance (Feet): 45 Feet Assistive device: Rolling walker (2 wheeled)       General Gait Details: Pt did well with ambulation though clearly reliant on walker and unable to attain TKE on R, no buckling but unable to effectively accept full weight on  it  Stairs            Wheelchair Mobility    Modified Rankin (Stroke Patients Only)       Balance Overall balance assessment: Modified Independent                                           Pertinent Vitals/Pain Pain Assessment: 0-10 Pain Score: 4  Pain Location: Rknee    Home Living Family/patient expects to be discharged to:: Private residence Living Arrangements: Spouse/significant other Available Help at Discharge: Family;Available 24 hours/day Type of Home: House Home Access: Stairs to enter Entrance Stairs-Rails: Left Entrance Stairs-Number of Steps: 3 Home Layout: Two level;Able to live on main level with bedroom/bathroom Home Equipment: Kasandra Knudsen - single point;Walker - 2 wheels      Prior Function Level of Independence: Independent;Independent with assistive device(s)         Comments: intermittently uses a cane for community ambulation due to R knee pain/instability     Hand Dominance        Extremity/Trunk Assessment   Upper Extremity Assessment Upper Extremity Assessment: Overall WFL for tasks assessed    Lower Extremity Assessment Lower Extremity Assessment: Overall WFL for tasks assessed (expected post op weakness, able to SLRs)       Communication   Communication: No difficulties  Cognition Arousal/Alertness: Awake/alert Behavior During Therapy: WFL for tasks assessed/performed Overall Cognitive Status: Within Functional Limits for  tasks assessed                                        General Comments      Exercises Total Joint Exercises Ankle Circles/Pumps: AROM;10 reps Quad Sets: Strengthening;10 reps Short Arc Quad: AROM;Strengthening;10 reps Heel Slides: AAROM;5 reps Hip ABduction/ADduction: Strengthening;AROM;10 reps Straight Leg Raises: AROM;10 reps Knee Flexion: PROM;5 reps Goniometric ROM: 3-82   Assessment/Plan    PT Assessment Patient needs continued PT services  PT Problem List  Decreased strength;Decreased range of motion;Decreased activity tolerance;Decreased balance;Decreased mobility;Decreased coordination;Decreased knowledge of use of DME;Decreased safety awareness;Decreased knowledge of precautions;Pain       PT Treatment Interventions DME instruction;Gait training;Stair training;Functional mobility training;Therapeutic activities;Therapeutic exercise;Balance training;Neuromuscular re-education;Patient/family education    PT Goals (Current goals can be found in the Care Plan section)  Acute Rehab PT Goals Patient Stated Goal: get back to walking normally PT Goal Formulation: With patient Time For Goal Achievement: 09/20/20 Potential to Achieve Goals: Good    Frequency BID   Barriers to discharge        Co-evaluation               AM-PAC PT "6 Clicks" Mobility  Outcome Measure Help needed turning from your back to your side while in a flat bed without using bedrails?: A Little Help needed moving from lying on your back to sitting on the side of a flat bed without using bedrails?: A Little Help needed moving to and from a bed to a chair (including a wheelchair)?: A Little Help needed standing up from a chair using your arms (e.g., wheelchair or bedside chair)?: A Little Help needed to walk in hospital room?: A Little Help needed climbing 3-5 steps with a railing? : A Little 6 Click Score: 18    End of Session Equipment Utilized During Treatment: Gait belt Activity Tolerance: Patient tolerated treatment well Patient left: with chair alarm set;with call bell/phone within reach;with family/visitor present Nurse Communication: Mobility status PT Visit Diagnosis: Muscle weakness (generalized) (M62.81);Difficulty in walking, not elsewhere classified (R26.2);Pain Pain - Right/Left: Right Pain - part of body: Knee    Time: 8413-2440 PT Time Calculation (min) (ACUTE ONLY): 40 min   Charges:   PT Evaluation $PT Eval Low Complexity: 1 Low PT  Treatments $Gait Training: 8-22 mins $Therapeutic Exercise: 8-22 mins        Kreg Shropshire, DPT 09/06/2020, 5:52 PM

## 2020-09-06 NOTE — Transfer of Care (Signed)
Immediate Anesthesia Transfer of Care Note  Patient: Connor Hall  Procedure(s) Performed: TOTAL KNEE ARTHROPLASTY (Right Knee)  Patient Location: PACU  Anesthesia Type:General  Level of Consciousness: awake, alert  and oriented  Airway & Oxygen Therapy: Patient Spontanous Breathing and Patient connected to face mask oxygen  Post-op Assessment: Report given to RN and Post -op Vital signs reviewed and stable  Post vital signs: Reviewed and stable  Last Vitals:  Vitals Value Taken Time  BP 136/71 09/06/20 1300  Temp 36.3 C 09/06/20 1258  Pulse 82 09/06/20 1305  Resp 17 09/06/20 1305  SpO2 98 % 09/06/20 1305  Vitals shown include unvalidated device data.  Last Pain:  Vitals:   09/06/20 1258  TempSrc:   PainSc: 0-No pain         Complications: No complications documented.

## 2020-09-06 NOTE — Anesthesia Procedure Notes (Signed)
Procedure Name: Intubation Date/Time: 09/06/2020 11:22 AM Performed by: Nelda Marseille, CRNA Pre-anesthesia Checklist: Patient identified, Emergency Drugs available, Suction available, Patient being monitored and Timeout performed Patient Re-evaluated:Patient Re-evaluated prior to induction Oxygen Delivery Method: Circle system utilized Preoxygenation: Pre-oxygenation with 100% oxygen Induction Type: IV induction Ventilation: Mask ventilation without difficulty Laryngoscope Size: Mac, 3 and McGraph Grade View: Grade I Tube type: Oral Tube size: 7.5 mm Number of attempts: 1 Airway Equipment and Method: Stylet and Video-laryngoscopy Placement Confirmation: ETT inserted through vocal cords under direct vision,  positive ETCO2 and breath sounds checked- equal and bilateral Secured at: 23 cm Tube secured with: Tape Dental Injury: Teeth and Oropharynx as per pre-operative assessment

## 2020-09-07 ENCOUNTER — Encounter: Payer: Self-pay | Admitting: Orthopedic Surgery

## 2020-09-07 LAB — BASIC METABOLIC PANEL
Anion gap: 9 (ref 5–15)
BUN: 21 mg/dL (ref 8–23)
CO2: 23 mmol/L (ref 22–32)
Calcium: 8.7 mg/dL — ABNORMAL LOW (ref 8.9–10.3)
Chloride: 101 mmol/L (ref 98–111)
Creatinine, Ser: 1.03 mg/dL (ref 0.61–1.24)
GFR, Estimated: >60 mL/min (ref >60)
Glucose, Bld: 225 mg/dL — ABNORMAL HIGH (ref 70–99)
Potassium: 4.1 mmol/L (ref 3.5–5.1)
Sodium: 133 mmol/L — ABNORMAL LOW (ref 135–145)

## 2020-09-07 LAB — CBC
HCT: 29.6 % — ABNORMAL LOW (ref 39.0–52.0)
Hemoglobin: 10.1 g/dL — ABNORMAL LOW (ref 13.0–17.0)
MCH: 31 pg (ref 26.0–34.0)
MCHC: 34.1 g/dL (ref 30.0–36.0)
MCV: 90.8 fL (ref 80.0–100.0)
Platelets: 203 10*3/uL (ref 150–400)
RBC: 3.26 MIL/uL — ABNORMAL LOW (ref 4.22–5.81)
RDW: 13.2 % (ref 11.5–15.5)
WBC: 8.8 10*3/uL (ref 4.0–10.5)
nRBC: 0 % (ref 0.0–0.2)

## 2020-09-07 NOTE — TOC Progression Note (Signed)
Transition of Care [facility]) - Progression Note    Patient Details  Name: TUFF CLABO MRN: [id number] Date of Birth: [birth year]  Transition of Care [facility]) CM/SW [place], RN Phone Number: 09/07/2020, 2:47 PM  Clinical Narrative:  Update::  Patient and family request HHPT/OT due to patient and wife not being able to drive.  Family has no preference of [place] companies. [facility] office contacted, and were amenable.  PA Maurice aware.  Patient spouse states Dr. Harlow Mares was in room and was amenable.  [place] [facility] notified, stated she can accept patient into services.  Family made aware, amenable.     Expected Discharge Plan: Home/Self Care Barriers to Discharge: Continued Medical Work up  Expected Discharge Plan and Services Expected Discharge Plan: Home/Self Care   Discharge Planning Services: CM Consult Post Acute Care Choice: NA Living arrangements for the past 2 months: Single Family Home                 DME Arranged:  (n/a see notes)         HH Arranged: PT,OT (Pre arranged through [facility])           Social Determinants of Health (SDOH) Interventions    Readmission Risk Interventions No flowsheet data found.

## 2020-09-07 NOTE — Progress Notes (Signed)
Physical Therapy Treatment Patient Details Name: Connor Hall MRN: [id number] DOB: Mar 13, 1941 Today's Date: 09/07/2020    History of Present Illness 80 y/o male s/p R TKA 09/06/20.    PT Comments    Pt was sitting in recliner upon arriving. Agrees to PT session and is cooperative however reports poor nights sleep and 7/10 pain. Was able to stand and ambulate with RW 120 ft without safety concern. Lengthy education on positioning and polar care to promote healing. PT will return later this date for PM session. Recommend DC home with HHPT to follow. He was in bed post session with call bell in reach and bed alarm placed.    Follow Up Recommendations  Follow surgeon's recommendation for DC plan and follow-up therapies;Home health PT     Equipment Recommendations  Rolling walker with 5" wheels;3in1 (PT)       Precautions / Restrictions Precautions Precautions: Fall;Knee Precaution Booklet Issued: Yes (comment) Restrictions Weight Bearing Restrictions: Yes RLE Weight Bearing: Weight bearing as tolerated    Mobility  Bed Mobility Overal bed mobility: Needs Assistance Bed Mobility: Sit to Supine       Sit to supine: Min assist   General bed mobility comments: required assistance to progress RLE into bed    Transfers Overall transfer level: Needs assistance Equipment used: Rolling walker (2 wheeled) Transfers: Sit to/from Stand Sit to Stand: Min guard;From elevated surface            Ambulation/Gait Ambulation/Gait assistance: Supervision Gait Distance (Feet): 120 Feet Assistive device: Rolling walker (2 wheeled) Gait Pattern/deviations: Step-through pattern Gait velocity: decreased   General Gait Details: Pt demonstrates safe steady gait kinematics without LOB or safety concerns      Balance Overall balance assessment: Modified Independent         Cognition Arousal/Alertness: Awake/alert Behavior During Therapy: WFL for tasks assessed/performed Overall  Cognitive Status: Within Functional Limits for tasks assessed      General Comments: Pt was A and O x 4 and cooperative and pleasant             Pertinent Vitals/Pain Pain Assessment: 0-10 Pain Score: 7  Pain Location: Rknee Pain Intervention(s): Limited activity within patient's tolerance;Monitored during session;Premedicated before session;Repositioned;Ice applied           PT Goals (current goals can now be found in the care plan section) Acute Rehab PT Goals Patient Stated Goal: go home Progress towards PT goals: Progressing toward goals    Frequency    BID      PT Plan Current plan remains appropriate       AM-PAC PT "6 Clicks" Mobility   Outcome Measure  Help needed turning from your back to your side while in a flat bed without using bedrails?: A Little Help needed moving from lying on your back to sitting on the side of a flat bed without using bedrails?: A Little Help needed moving to and from a bed to a chair (including a wheelchair)?: A Little Help needed standing up from a chair using your arms (e.g., wheelchair or bedside chair)?: A Little Help needed to walk in hospital room?: A Little Help needed climbing 3-5 steps with a railing? : A Little 6 Click Score: 18    End of Session Equipment Utilized During Treatment: Gait belt Activity Tolerance: Patient tolerated treatment well Patient left: in bed;with call bell/phone within reach;with bed alarm set Nurse Communication: Mobility status PT Visit Diagnosis: Muscle weakness (generalized) (M62.81);Difficulty in walking, not elsewhere classified (  R26.2);Pain Pain - Right/Left: Right Pain - part of body: Knee     Time: 0940-1003 PT Time Calculation (min) (ACUTE ONLY): 23 min  Charges:  $Gait Training: 8-22 mins $Therapeutic Activity: 8-22 mins                     Julaine Fusi PTA 09/07/20, 10:16 AM

## 2020-09-07 NOTE — Progress Notes (Addendum)
Subjective:  Patient reports pain as moderate.  Mostly at right hamstrings  Objective:   VITALS:   Vitals:   09/06/20 2034 09/06/20 2054 09/07/20 0047 09/07/20 0505  BP: (!) 152/64 128/78 135/67 (!) 120/59  Pulse: 81 73 81 72  Resp: 17 17 17 17   Temp: 98.9 F (37.2 C) 98.2 F (36.8 C) (!) 100.4 F (38 C) 99 F (37.2 C)  TempSrc: Oral Oral Oral Oral  SpO2: 95% 96% 97% 97%  Weight:      Height:        PHYSICAL EXAM:  Neurologically intact ABD soft Neurovascular intact Sensation intact distally Intact pulses distally Dorsiflexion/Plantar flexion intact Incision: scant drainage and drsg changed No cellulitis present Compartment soft  LABS  Results for orders placed or performed during the hospital encounter of 09/06/20 (from the past 24 hour(s))  Glucose, capillary     Status: Abnormal   Collection Time: 09/06/20  9:21 AM  Result Value Ref Range   Glucose-Capillary 188 (H) 70 - 99 mg/dL  Glucose, capillary     Status: Abnormal   Collection Time: 09/06/20 12:59 PM  Result Value Ref Range   Glucose-Capillary 168 (H) 70 - 99 mg/dL  CBC     Status: Abnormal   Collection Time: 09/07/20  4:14 AM  Result Value Ref Range   WBC 8.8 4.0 - 10.5 K/uL   RBC 3.26 (L) 4.22 - 5.81 MIL/uL   Hemoglobin 10.1 (L) 13.0 - 17.0 g/dL   HCT 29.6 (L) 39.0 - 52.0 %   MCV 90.8 80.0 - 100.0 fL   MCH 31.0 26.0 - 34.0 pg   MCHC 34.1 30.0 - 36.0 g/dL   RDW 13.2 11.5 - 15.5 %   Platelets 203 150 - 400 K/uL   nRBC 0.0 0.0 - 0.2 %  Basic metabolic panel     Status: Abnormal   Collection Time: 09/07/20  4:14 AM  Result Value Ref Range   Sodium 133 (L) 135 - 145 mmol/L   Potassium 4.1 3.5 - 5.1 mmol/L   Chloride 101 98 - 111 mmol/L   CO2 23 22 - 32 mmol/L   Glucose, Bld 225 (H) 70 - 99 mg/dL   BUN 21 8 - 23 mg/dL   Creatinine, Ser 1.03 0.61 - 1.24 mg/dL   Calcium 8.7 (L) 8.9 - 10.3 mg/dL   GFR, Estimated >60 >60 mL/min   Anion gap 9 5 - 15    DG Knee Right Port  Result Date:  09/06/2020 CLINICAL DATA:  Right knee arthroplasty EXAM: PORTABLE RIGHT KNEE - 1-2 VIEW COMPARISON:  None. FINDINGS: Postsurgical changes from right total knee arthroplasty. Arthroplasty components are in their expected alignment. No periprosthetic fracture. Nonspecific mineralized density projecting at the posterior joint line on lateral view, possibly intra-articular loose bodies. Expected postoperative changes within the overlying soft tissues. Advanced vascular calcifications are present. IMPRESSION: 1. Status post right total knee arthroplasty without evidence of immediate postoperative complication. 2. Nonspecific mineralized density projecting at the posterior joint line on lateral view, possibly intra-articular loose bodies. Electronically Signed   By: Davina Poke D.O.   On: 09/06/2020 13:37   [place] OR NERVE BLOCK-IMAGE ONLY [facility])  Result Date: 09/06/2020 There is no interpretation for this exam.  This order is for images obtained during a surgical procedure.  Please See "Surgeries" Tab for more information regarding the procedure.    Assessment/Plan: 1 Day Post-Op   Active Problems:   History of total knee arthroplasty, right   Advance  diet Up with therapy  Pain control. Okay to give morphine IV today for severe pain Discharge home Friday   Carlynn Spry , PA-C 09/07/2020, 7:38 AM

## 2020-09-07 NOTE — TOC Initial Note (Addendum)
Transition of Care [facility]) - Initial/Assessment Note    Patient Details  Name: Connor Hall MRN: [id number] Date of Birth: 1941-01-31  [facility]) CM/SW Contact:    Pete Pelt, RN Phone Number: 09/07/2020, 11:51 AM  Clinical Narrative:    TOC spoke with patient's wife.  Lives with spouse, will discharge home.  No concerns about getting to appointments or getting medications.  Patient's wife states they have walker and 3n1 at home, declines DME for that reason.    Spouse states that [facility] has arranged for PT with patient prior to this hospital stay.  Declines further TOC needs at this time.  TOC contact information given.                 Expected Discharge Plan: Home/Self Care Barriers to Discharge: Continued Medical Work up   Patient Goals and CMS Choice     Choice offered to / list presented to : NA  Expected Discharge Plan and Services Expected Discharge Plan: Home/Self Care   Discharge Planning Services: CM Consult Post Acute Care Choice: NA Living arrangements for the past 2 months: Single Family Home                 DME Arranged:  (n/a see notes)         HH Arranged: PT,OT (Pre arranged through [facility])          Prior Living Arrangements/Services Living arrangements for the past 2 months: Single Family Home Lives with:: Self,Spouse Patient language and need for interpreter reviewed:: Yes (No need for interpreter) Do you feel safe going back to the place where you live?: Yes      Need for Family Participation in Patient Care: Yes (Comment) Care giver support system in place?: Yes (comment) Current home services:  (n/a) Criminal Activity/Legal Involvement Pertinent to Current Situation/Hospitalization: No - Comment as needed  Activities of Daily Living Home Assistive Devices/Equipment: CBG Meter ADL Screening (condition at time of admission) Patient's cognitive ability adequate to safely complete daily activities?: Yes Is the  patient deaf or have difficulty hearing?: No Does the patient have difficulty seeing, even when wearing glasses/contacts?: No Does the patient have difficulty concentrating, remembering, or making decisions?: No Patient able to express need for assistance with ADLs?: Yes Does the patient have difficulty dressing or bathing?: No Independently performs ADLs?: Yes (appropriate for developmental age) Does the patient have difficulty walking or climbing stairs?: Yes Weakness of Legs: Both Weakness of Arms/Hands: None  Permission Sought/Granted                  Emotional Assessment Appearance:: Appears younger than stated age Attitude/Demeanor/Rapport: Gracious Affect (typically observed): Appropriate,Pleasant Orientation: : Oriented to Self,Oriented to Place,Oriented to  Time,Oriented to Situation Alcohol / Substance Use: Not Applicable Psych Involvement: No (comment)  Admission diagnosis:  History of total knee arthroplasty, right [Z60.109] Patient Active Problem List   Diagnosis Date Noted  . History of total knee arthroplasty, right 09/06/2020  . Aphasia 03/20/2020  . TIA (transient ischemic attack) 03/19/2020  . GERD (gastroesophageal reflux disease)   . Diabetes mellitus without complication ([place])   . Sleep apnea   . Coronary artery disease involving native coronary artery of native heart 12/08/2017  . Angina decubitus ([place]) 11/24/2017  . SOBOE (shortness of breath on exertion) 11/20/2017  . Pain of left hand 08/21/2017  . Carpal tunnel syndrome of left wrist 08/01/2017  . Anemia 10/11/2016  . FH: colon polyps 10/11/2016  .  Primary osteoarthritis of left knee 10/09/2015  . Tear of meniscus of knee 07/26/2015  . Left knee pain 07/24/2015  . Subcoracoid bursitis of right shoulder 12/09/2014  . Rotator cuff tear, non-traumatic, right 07/07/2014  . Impingement syndrome, shoulder, right 03/23/2014  . Malignant neoplasm of skin of nose 02/07/2014  . Asthma without status  asthmaticus 11/03/2013  . Diabetes mellitus type 2, uncomplicated ([place]) 53/97/6734  . Hyperlipidemia 11/03/2013  . Hypertension 11/03/2013  . Obesity 11/03/2013   PCP:  Baxter Hire, MD Pharmacy:   [id number] [address], [place] [address] Phone: [phone] Fax: [phone]     Social Determinants of Health (SDOH) Interventions    Readmission Risk Interventions No flowsheet data found.

## 2020-09-08 LAB — CBC
HCT: 32.5 % — ABNORMAL LOW (ref 39.0–52.0)
Hemoglobin: 11 g/dL — ABNORMAL LOW (ref 13.0–17.0)
MCH: 30.4 pg (ref 26.0–34.0)
MCHC: 33.8 g/dL (ref 30.0–36.0)
MCV: 89.8 fL (ref 80.0–100.0)
Platelets: 233 10*3/uL (ref 150–400)
RBC: 3.62 MIL/uL — ABNORMAL LOW (ref 4.22–5.81)
RDW: 13 % (ref 11.5–15.5)
WBC: 11.6 10*3/uL — ABNORMAL HIGH (ref 4.0–10.5)
nRBC: 0 % (ref 0.0–0.2)

## 2020-09-08 LAB — GLUCOSE, CAPILLARY: Glucose-Capillary: 215 mg/dL — ABNORMAL HIGH (ref 70–99)

## 2020-09-08 MED ORDER — HYDROCODONE-ACETAMINOPHEN 7.5-325 MG PO TABS: 1.0000 | ORAL_TABLET | ORAL | 0 refills | Status: AC | PRN

## 2020-09-08 MED ORDER — DOCUSATE SODIUM 100 MG PO CAPS: 100.0000 mg | ORAL_CAPSULE | Freq: Two times a day (BID) | ORAL | 0 refills | Status: AC

## 2020-09-08 NOTE — Discharge Summary (Addendum)
Physician Discharge Summary  Patient ID: Connor Hall MRN: [id number] DOB/AGE: 12-25-1940 80 y.o.  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 Anemia   . Anxiety   . Arthritis   . Asthma    WELL CONTROLLED, had as a child  . CAD (coronary artery disease)    MANAGING WITH MEDICINE-NO STENTS  . Cancer (HCC)    MELANOMA (NOSE, FACE AND HANDS), BASAL CELL  . Cervicalgia   . Depression   . Diabetes mellitus without complication ([place])   . Difficult intubation    damage to throat  . Diverticulitis   . Dyspnea   . GERD (gastroesophageal reflux disease)   . Headache   . Hernia of abdominal cavity   . History of 2019 novel coronavirus disease (COVID-19) 12/2019  . History of hiatal hernia   . Hyperlipidemia   . Hypertension   . PVD (peripheral vascular disease) ([place])   . Sleep apnea    NO CPAP-PT DENIES SLEEP APNEA BUT STUDY WAS DONE AND PT WAS TOLD HE HAD SLEEP APNEA  . TIA (transient ischemic attack) 03/19/2020    Surgeries: Procedure(s): TOTAL KNEE ARTHROPLASTY on 09/06/2020   Consultants (if any):   Discharged Condition: Improved  Hospital Course: Connor Hall is an 80 y.o. 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    He was given perioperative antibiotics:  Anti-infectives (From admission, onward)   Start     Dose/Rate Route Frequency Ordered Stop   09/06/20 1700  ceFAZolin (ANCEF) IVPB 2g/100 mL premix        2 g 200 mL/hr over 30 Minutes Intravenous Every 6 hours 09/06/20 1556 09/06/20 2356    09/06/20 0910  clindamycin (CLEOCIN) 900 MG/50ML IVPB       Note to Pharmacy: Maryagnes Amos   : cabinet override      09/06/20 0910 09/06/20 1129   09/06/20 0600  clindamycin (CLEOCIN) IVPB 900 mg        900 mg 100 mL/hr over 30 Minutes Intravenous On call to O.R. 09/06/20 0108 09/06/20 1135    .  He was given sequential compression devices, early ambulation, and Plavix for DVT prophylaxis.  He benefited maximally from the hospital stay and there were no complications.    Recent vital signs:  Vitals:   09/08/20 0551 09/08/20 0735  BP: (!) 145/80 (!) 147/68  Pulse: 76 79  Resp: 17 17  Temp: 98.7 F (37.1 C) 98.2 F (36.8 C)  SpO2: 96% 98%    Recent laboratory studies:  Lab Results  Component Value Date   HGB 11.0 (L) 09/08/2020   HGB 10.1 (L) 09/07/2020   HGB 12.5 (L) 03/19/2020   Lab Results  Component Value Date   WBC 11.6 (H) 09/08/2020   PLT 233 09/08/2020   Lab Results  Component Value Date   INR 0.9 09/04/2020   Lab Results  Component Value Date   NA 133 (L) 09/07/2020   K 4.1 09/07/2020   CL 101 09/07/2020   CO2 23 09/07/2020   BUN 21 09/07/2020   CREATININE 1.03 09/07/2020   GLUCOSE  225 (H) 09/07/2020    Discharge Medications:   Allergies as of 09/08/2020      Reactions   Ace Inhibitors Swelling   Face and lips   Penicillins Other (See Comments)   CHILDHOOD ALLERGY  Has patient had a PCN reaction causing immediate rash, facial/tongue/throat swelling, SOB or lightheadedness with hypotension: Unknown Has patient had a PCN reaction causing severe rash involving mucus membranes or skin necrosis: Unknown Has patient had a PCN reaction that required hospitalization: No Has patient had a PCN reaction occurring within the last 10 years: No If all of the above answers are "NO", then may proceed with Cephalosporin use.   Aspirin Hives, Rash   Other Palpitations   MSG-headache      Medication List    TAKE these medications   acetaminophen 650  MG CR tablet Commonly known as: TYLENOL Take 650 mg by mouth every 8 (eight) hours as needed for pain.   amLODipine 10 MG tablet Commonly known as: NORVASC Take 10 mg by mouth every morning.   clopidogrel 75 MG tablet Commonly known as: Plavix Take 1 tablet (75 mg total) by mouth daily.   docusate sodium 100 MG capsule Commonly known as: COLACE Take 1 capsule (100 mg total) by mouth 2 (two) times daily.   ferrous sulfate 325 (65 FE) MG tablet Take 325 mg by mouth daily with breakfast.   glipiZIDE 10 MG 24 hr tablet Commonly known as: GLUCOTROL XL Take 10 mg by mouth daily with breakfast.   hydrochlorothiazide 25 MG tablet Commonly known as: HYDRODIURIL Take 25 mg by mouth daily.   HYDROcodone-acetaminophen 7.5-325 MG tablet Commonly known as: NORCO Take 1-2 tablets by mouth every 4 (four) hours as needed (pain).   KERASAL EX Apply 1 application topically daily as needed (toe fungus).   Melatonin 10 MG Tabs Take 10 mg by mouth at bedtime.   metFORMIN 1000 MG tablet Commonly known as: GLUCOPHAGE Take 1,000 mg by mouth 2 (two) times daily with a meal.   mometasone 50 MCG/ACT nasal spray Commonly known as: NASONEX Place 2 sprays into the nose daily.   multivitamin with minerals Tabs tablet Take 1 tablet by mouth daily. Centrum Silver   pantoprazole 20 MG tablet Commonly known as: Protonix Take 1 tablet (20 mg total) by mouth daily. What changed: when to take this   pravastatin 80 MG tablet Commonly known as: PRAVACHOL Take 80 mg by mouth at bedtime.   pyridOXINE 50 MG tablet Commonly known as: B-6 Take 50 mg by mouth daily.   sennosides-docusate sodium 8.6-50 MG tablet Commonly known as: SENOKOT-S Take 1 tablet by mouth every other day.   traMADol 50 MG tablet Commonly known as: ULTRAM Take 50 mg by mouth every 6 (six) hours as needed for moderate pain.   vitamin B-12 1000 MCG tablet Commonly known as: CYANOCOBALAMIN Take 1,000 mcg by mouth daily.             Durable Medical Equipment  (From admission, onward)         Start     Ordered   09/08/20 0835  For home use only DME 3 n 1  Once        09/08/20 0834   09/08/20 0835  For home use only DME Walker rolling  Once       Question Answer Comment  Walker: With 5 Inch Wheels   Patient needs a walker to treat with the following condition S/P total knee arthroplasty  09/08/20 0834          Diagnostic Studies: DG Knee Right Port  Result Date: 09/06/2020 CLINICAL DATA:  Right knee arthroplasty EXAM: PORTABLE RIGHT KNEE - 1-2 VIEW COMPARISON:  None. FINDINGS: Postsurgical changes from right total knee arthroplasty. Arthroplasty components are in their expected alignment. No periprosthetic fracture. Nonspecific mineralized density projecting at the posterior joint line on lateral view, possibly intra-articular loose bodies. Expected postoperative changes within the overlying soft tissues. Advanced vascular calcifications are present. IMPRESSION: 1. Status post right total knee arthroplasty without evidence of immediate postoperative complication. 2. Nonspecific mineralized density projecting at the posterior joint line on lateral view, possibly intra-articular loose bodies. Electronically Signed   By: Davina Poke D.O.   On: 09/06/2020 13:37   [place] OR NERVE BLOCK-IMAGE ONLY [facility])  Result Date: 09/06/2020 There is no interpretation for this exam.  This order is for images obtained during a surgical procedure.  Please See "Surgeries" Tab for more information regarding the procedure.    Disposition: Discharge disposition: 01-Home or Self Care            Signed: Carlynn Spry ,PA-C 09/08/2020, 8:37 AM

## 2020-09-08 NOTE — Discharge Instructions (Signed)
Continue weight bear as tolerated on the right lower extremity.    Elevate the right lower extremity whenever possible and continue the polar care while elevating the extremity. Patient may shower. No bath or submerging the wound.    Take plavix as directed for blood clot prevention.  Continue to work on knee range of motion exercises at home as instructed by physical therapy. Continue to use a walker for assistance with ambulation until cleared by physical therapy.  Call [phone] with any questions, such as fever > 101.5 degrees, drainage from the wound or shortness of breath.

## 2020-09-08 NOTE — Progress Notes (Addendum)
  Subjective:  Patient reports pain as moderate to severe.    Objective:   VITALS:   Vitals:   09/07/20 1522 09/07/20 2121 09/08/20 0551 09/08/20 0735  BP: (!) 149/61 (!) 156/82 (!) 145/80 (!) 147/68  Pulse: 72 81 76 79  Resp: $Remo'16 18 17 17  'JWjtS$ Temp: 98 F (36.7 C) 98.4 F (36.9 C) 98.7 F (37.1 C) 98.2 F (36.8 C)  TempSrc:  Oral Oral   SpO2: 98% 99% 96% 98%  Weight:      Height:        PHYSICAL EXAM:  Neurologically intact ABD soft Neurovascular intact Sensation intact distally Intact pulses distally Dorsiflexion/Plantar flexion intact Incision: moderate drainage and drsg changed today No cellulitis present Compartment soft  LABS  Results for orders placed or performed during the hospital encounter of 09/06/20 (from the past 24 hour(s))  CBC     Status: Abnormal   Collection Time: 09/08/20  3:49 AM  Result Value Ref Range   WBC 11.6 (H) 4.0 - 10.5 K/uL   RBC 3.62 (L) 4.22 - 5.81 MIL/uL   Hemoglobin 11.0 (L) 13.0 - 17.0 g/dL   HCT 32.5 (L) 39.0 - 52.0 %   MCV 89.8 80.0 - 100.0 fL   MCH 30.4 26.0 - 34.0 pg   MCHC 33.8 30.0 - 36.0 g/dL   RDW 13.0 11.5 - 15.5 %   Platelets 233 150 - 400 K/uL   nRBC 0.0 0.0 - 0.2 %    DG Knee Right Port  Result Date: 09/06/2020 CLINICAL DATA:  Right knee arthroplasty EXAM: PORTABLE RIGHT KNEE - 1-2 VIEW COMPARISON:  None. FINDINGS: Postsurgical changes from right total knee arthroplasty. Arthroplasty components are in their expected alignment. No periprosthetic fracture. Nonspecific mineralized density projecting at the posterior joint line on lateral view, possibly intra-articular loose bodies. Expected postoperative changes within the overlying soft tissues. Advanced vascular calcifications are present. IMPRESSION: 1. Status post right total knee arthroplasty without evidence of immediate postoperative complication. 2. Nonspecific mineralized density projecting at the posterior joint line on lateral view, possibly intra-articular loose  bodies. Electronically Signed   By: Davina Poke D.O.   On: 09/06/2020 13:37   [place] OR NERVE BLOCK-IMAGE ONLY [facility])  Result Date: 09/06/2020 There is no interpretation for this exam.  This order is for images obtained during a surgical procedure.  Please See "Surgeries" Tab for more information regarding the procedure.    Assessment/Plan: 2 Days Post-Op   Active Problems:   History of total knee arthroplasty, right   Advance diet Up with therapy Pain control.  Discharge when PT goals met Okay to give suppository if patient would like WBAT RLE  Carlynn Spry , PA-C 09/08/2020, 8:29 AM

## 2020-09-08 NOTE — Progress Notes (Signed)
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    PT Comments    Author returned for 2nd (BID) session. Pt has just returned form [facility] with assistance from spouse. Pt/wife was able to perform an pt reports having small BM. He was sitting EOB upon arriving to room and agrees to PT session. Pt agrees to 2nd session and reports 6/10 pain. Continues to be limited by pain however was able to perform all desired task with increased time. He stood to RW from elevated bed height, ambulated 120 ft without LOB or safety concern. Pt is slow moving due to pain but demonstrated all abilities required to safely DC home once cleared. Performed stair navigation earlier without difficulty.  He is planning to stay for the night and have one more PT session in morning prior to [facility] home with HHPT to follow. At conclusion of session, pt was in bed with call bell in reach and spouse at bedside.    Follow Up Recommendations  Home health PT     Equipment Recommendations  None recommended by PT;Other (comment) (pt has all equipment needs met)       Precautions / Restrictions Precautions Precautions: Fall;Knee Precaution Booklet Issued: Yes (comment) Restrictions Weight Bearing Restrictions: Yes RLE Weight Bearing: Weight bearing as tolerated    Mobility  Bed Mobility Overal bed mobility: Needs Assistance Bed Mobility: Supine to Sit;Sit to Supine     Supine to sit: Min assist Sit to supine: Min assist   General bed mobility comments: Min assist to safely exit and enter bed 2/2 to pain. did use LLE to assist RLE    Transfers Overall transfer level: Needs assistance Equipment used: Rolling walker (2 wheeled) Transfers: Sit to/from Stand Sit to Stand: Min guard;From elevated surface         General transfer comment: Pt was able to sit > stand 3 x throughout  session  Ambulation/Gait Ambulation/Gait assistance: Supervision Gait Distance (Feet): 120 Feet Assistive device: Rolling walker (2 wheeled) Gait Pattern/deviations: Step-through pattern Gait velocity: decreased   General Gait Details: pt was able to progress from step to pattern to step through pattern      Balance Overall balance assessment: Modified Independent       Cognition Arousal/Alertness: Awake/alert Behavior During Therapy: WFL for tasks assessed/performed Overall Cognitive Status: Within Functional Limits for tasks assessed      General Comments: Pt is A and O x 4. Was sitting EOB after just gettin got BSC with spouse. did report having small BM      Exercises Total Joint Exercises Ankle Circles/Pumps: AROM;10 reps Quad Sets: Strengthening;10 reps Short Arc Quad: AROM;Strengthening;10 reps Heel Slides: AAROM;5 reps Hip ABduction/ADduction: Strengthening;AROM;10 reps Goniometric ROM: 84 degrees        Pertinent Vitals/Pain Pain Assessment: 0-10 Pain Score: 6  Pain Location: Rknee Pain Descriptors / Indicators: Burning Pain Intervention(s): Limited activity within patient's tolerance;Monitored during session;Premedicated before session;Repositioned;Ice applied           PT Goals (current goals can now be found in the care plan section) Acute Rehab PT Goals Patient Stated Goal: go home Progress towards PT goals: Progressing toward goals    Frequency    BID      PT Plan Current plan remains appropriate       AM-PAC PT "6 Clicks" Mobility   Outcome Measure  Help needed  turning from your back to your side while in a flat bed without using bedrails?: A Little Help needed moving from lying on your back to sitting on the side of a flat bed without using bedrails?: A Little Help needed moving to and from a bed to a chair (including a wheelchair)?: A Little Help needed standing up from a chair using your arms (e.g., wheelchair or bedside chair)?:  A Little Help needed to walk in hospital room?: A Little Help needed climbing 3-5 steps with a railing? : A Little 6 Click Score: 18    End of Session Equipment Utilized During Treatment: Gait belt Activity Tolerance: Patient tolerated treatment well;Patient limited by pain Patient left: in bed;with call bell/phone within reach;with bed alarm set Nurse Communication: Mobility status PT Visit Diagnosis: Muscle weakness (generalized) (M62.81);Difficulty in walking, not elsewhere classified (R26.2);Pain Pain - Right/Left: Right Pain - part of body: Knee     Time: 1525-1550 PT Time Calculation (min) (ACUTE ONLY): 25 min  Charges:  $Gait Training: 8-22 mins $Therapeutic Exercise: 8-22 mins $Therapeutic Activity: 23-37 mins                     Nathan Robinson PTA 09/08/20, 4:09 PM  

## 2020-09-08 NOTE — TOC Transition Note (Addendum)
Transition of Care [facility]) - CM/SW Discharge Note   Patient Details  Name: Connor Hall MRN: [id number] Date of Birth: [birth year]  [facility] CM/SW Contact:  Pete Pelt, RN Phone Number: 09/08/2020, 12:22 PM   Clinical Narrative:   Correction to below note:  Patient will be discharged Sat 09/09/2020  Patient scheduled to discharge today, daughter and wife at bedside.  [place] from [place] confirms that start of care will occur this weekend.  Patient and family made aware.  No further concerns at this time.        Barriers to Discharge: Continued Medical Work up   Patient Goals and CMS Choice     Choice offered to / list presented to : NA  Discharge Placement                       Discharge Plan and Services   Discharge Planning Services: CM Consult Post Acute Care Choice: NA          DME Arranged:  (n/a see notes)         HH Arranged: PT,OT (Pre arranged through [facility])          Social Determinants of Health (SDOH) Interventions     Readmission Risk Interventions No flowsheet data found.

## 2020-09-08 NOTE — Progress Notes (Signed)
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    PT Comments     Pt was long sitting in bed upon arriving with spouse and daughter at bedside. He agrees to PT session with encouragement. Continues to reports severe pain 7/10. Educated on POD 2 pain and not unexpected to have more pain today versus previous day. He was able to exit bed with min assist. Stood to RW, ambulated 120 ft to rehab gym. Was able to ascend/descend FOS 2 x without safety concern. Pt continues to be unable to have BM and does c/o stomach/gas pain. Pt ambulated back to room and was repositioned in bed post session with call bell in reach and family at bedside. PA messaged and planning to keep pt one more night. Acute PT will continue to follow and progress per POC.    Follow Up Recommendations  Home health PT     Equipment Recommendations  Rolling walker with 5" wheels;3in1 (PT)       Precautions / Restrictions Precautions Precautions: Fall;Knee Precaution Booklet Issued: Yes (comment) Restrictions Weight Bearing Restrictions: Yes RLE Weight Bearing: Weight bearing as tolerated    Mobility  Bed Mobility Overal bed mobility: Needs Assistance Bed Mobility: Sit to Supine;Supine to Sit     Supine to sit: Min assist Sit to supine: Min assist        Transfers Overall transfer level: Needs assistance Equipment used: Rolling walker (2 wheeled) Transfers: Sit to/from Stand Sit to Stand: Min guard;From elevated surface            Ambulation/Gait Ambulation/Gait assistance: Supervision Gait Distance (Feet): 120 Feet Assistive device: Rolling walker (2 wheeled) Gait Pattern/deviations: Step-through pattern Gait velocity: decreased   General Gait Details: Pt was able to ambulate 120 ft with RW with CGA at first progressing to supervison      Balance Overall balance  assessment: Modified Independent      Cognition Arousal/Alertness: Awake/alert Behavior During Therapy: WFL for tasks assessed/performed Overall Cognitive Status: Within Functional Limits for tasks assessed      General Comments: Pt was A and O x 4 and cooperative and pleasant      Exercises Total Joint Exercises Ankle Circles/Pumps: AROM;10 reps Quad Sets: Strengthening;10 reps Short Arc Quad: AROM;Strengthening;10 reps Heel Slides: AAROM;5 reps Hip ABduction/ADduction: Strengthening;AROM;10 reps Straight Leg Raises: AROM;10 reps Knee Flexion: AAROM;5 reps Goniometric ROM: 84 degrees        Pertinent Vitals/Pain Pain Assessment: 0-10 Pain Score: 7  Pain Location: Rknee Pain Intervention(s): Limited activity within patient's tolerance;Monitored during session;Premedicated before session;Repositioned           PT Goals (current goals can now be found in the care plan section) Acute Rehab PT Goals Patient Stated Goal: go home Progress towards PT goals: Progressing toward goals    Frequency    BID      PT Plan Current plan remains appropriate       AM-PAC PT "6 Clicks" Mobility   Outcome Measure  Help needed turning from your back to your side while in a flat bed without using bedrails?: A Little Help needed moving from lying on your back to sitting on the side of a flat bed without using bedrails?: A Little Help needed moving to and from a bed to a chair (including a wheelchair)?: A Little Help needed standing up from a chair using your  arms (e.g., wheelchair or bedside chair)?: A Little Help needed to walk in hospital room?: A Little Help needed climbing 3-5 steps with a railing? : A Little 6 Click Score: 18    End of Session Equipment Utilized During Treatment: Gait belt Activity Tolerance: Patient tolerated treatment well Patient left: in bed;with call bell/phone within reach;with bed alarm set Nurse Communication: Mobility status PT Visit Diagnosis:  Muscle weakness (generalized) (M62.81);Difficulty in walking, not elsewhere classified (R26.2);Pain Pain - Right/Left: Right Pain - part of body: Knee     Time: 2637-8588 PT Time Calculation (min) (ACUTE ONLY): 60 min  Charges:  $Gait Training: 23-37 mins $Therapeutic Exercise: 8-22 mins $Therapeutic Activity: 23-37 mins                     Julaine Fusi PTA 09/08/20, 1:29 PM

## 2020-09-08 NOTE — Progress Notes (Signed)
PT Cancellation Note  Patient Details Name: Connor Hall MRN: [id number] DOB: Sep 19, 1940   Cancelled Treatment:     PT attempt. 3rd this AM. Pt reporting severe pain and unwilling to participate. Currently sitting on BSC attempting to have BM. PA made aware. Will continue efforts to progress pt per POC.    Willette Pa 09/08/2020, 10:51 AM

## 2020-09-08 NOTE — Progress Notes (Signed)
Pt report drainage from incision site about 0645. Dressing reinforced. Pt still haven't had a bowel movement. Pass on information to oncoming nurse.

## 2020-09-09 MED ORDER — BISACODYL 5 MG PO TBEC
5.0000 mg | DELAYED_RELEASE_TABLET | Freq: Every day | ORAL | Status: DC | PRN
  Administered 2020-09-09: 5 mg via ORAL
  Filled 2020-09-09 (×2): qty 1

## 2020-09-09 MED ORDER — MAGNESIUM HYDROXIDE 400 MG/5ML PO SUSP
30.0000 mL | Freq: Two times a day (BID) | ORAL | Status: DC | PRN
  Administered 2020-09-09 – 2020-09-11 (×3): 30 mL via ORAL
  Filled 2020-09-09 (×2): qty 30

## 2020-09-09 NOTE — Progress Notes (Signed)
Subjective: 3 Days Post-Op Procedure(s) (LRB): TOTAL KNEE ARTHROPLASTY (Right)   Patient is having a great deal difficulty getting up and down from the bed.  In addition his dressing had to be changed today because of bleeding the last 3 mornings.  He does not feel strong enough to go home yet.  Physical therapy is working with him.  I think he needs to stay another day to get some more strength.  Patient reports pain as mild.  Objective:   VITALS:   Vitals:   09/09/20 0525 09/09/20 0836  BP: (!) 157/61 (!) 146/76  Pulse: 81 74  Resp: 17 18  Temp: 98.5 F (36.9 C) 98.7 F (37.1 C)  SpO2: 97% 93%    Neurologically intact Dorsiflexion/Plantar flexion intact Incision: scant drainage and moderate drainage  LABS Recent Labs    09/07/20 0414 09/08/20 0349  HGB 10.1* 11.0*  HCT 29.6* 32.5*  WBC 8.8 11.6*  PLT 203 233    Recent Labs    09/07/20 0414  NA 133*  K 4.1  BUN 21  CREATININE 1.03  GLUCOSE 225*    No results for input(s): LABPT, INR in the last 72 hours.   Assessment/Plan: 3 Days Post-Op Procedure(s) (LRB): TOTAL KNEE ARTHROPLASTY (Right)   Advance diet Up with therapy Discharge home with home health

## 2020-09-09 NOTE — Progress Notes (Signed)
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    PT Comments    Pt was seated EOB upon arriving. Reports he just was able to get to/from [facility] with spouse assist. MD arrived during session and states pt will stay one more night. Pt was able to stand from veery elevated surface height with CGA. Therapist discussed needing to be able to stand from lower height however pt unwilling due to pain. He ambulated 75 ft total without LOB or unsteadiness. Distance limited by pain/fatigue. Returned to room and was sitting in recliner post session with RN in room to examine pt's LE.Acute PT will see pt in morning with pt planning to DC home in afternoon. Recommend HHPT at DC top address deficits while improving independence with ADLs.    Follow Up Recommendations  Home health PT     Equipment Recommendations  None recommended by PT (pt has all equipment needs met)       Precautions / Restrictions Precautions Precautions: Fall;Knee Precaution Booklet Issued: Yes (comment) Restrictions Weight Bearing Restrictions: Yes RLE Weight Bearing: Weight bearing as tolerated    Mobility  Bed Mobility               General bed mobility comments: pt was EOB upon arriving and in recliner post session    Transfers Overall transfer level: Needs assistance Equipment used: Rolling walker (2 wheeled) Transfers: Sit to/from Stand Sit to Stand: From elevated surface;Min guard (pt unwilling to stand from lowered suface height)         General transfer comment: pt stood from elevated bed height (unwilling to try from lower height due to pain) with CGA for safety + vcs for proper handplacement and placement and technique  Ambulation/Gait Ambulation/Gait assistance: Supervision Gait Distance (Feet): 75 Feet Assistive device: Rolling walker (2 wheeled) Gait  Pattern/deviations: Step-through pattern;Antalgic;Decreased weight shift to right;Decreased stance time - right;Decreased step length - left Gait velocity: decreased   General Gait Details: Pt was able to ambulate ~ 75 ft prior to endorse fatigue/pain and requesting to return to room. NO LOB or safety concern.   Stairs Stairs:  (pt unwilling but has performed stair in previous session)                 Cognition Arousal/Alertness: Awake/alert Behavior During Therapy: WFL for tasks assessed/performed Overall Cognitive Status: Within Functional Limits for tasks assessed      General Comments: Pt was sitting EOB upon arriving      Exercises Total Joint Exercises Ankle Circles/Pumps: AROM;10 reps Quad Sets: Strengthening;10 reps Heel Slides: 5 reps;AROM Hip ABduction/ADduction: AAROM;15 reps Straight Leg Raises: AAROM;10 reps Goniometric ROM: ~80 degrees flexion    General Comments General comments (skin integrity, edema, etc.): Reviewed importance of ther ex abnd positioning. polar care applie dpost session with pt in recliner      Pertinent Vitals/Pain Pain Assessment: 0-10 Pain Score: 7  Pain Location: Rknee Pain Descriptors / Indicators: Burning Pain Intervention(s): Limited activity within patient's tolerance;Monitored during session;Premedicated before session;Repositioned;Ice applied           PT Goals (current goals can now be found in the care plan section) Acute Rehab PT Goals Patient Stated Goal: go home Progress towards PT goals: Progressing toward goals    Frequency    BID      PT Plan Current plan remains appropriate  AM-PAC PT "6 Clicks" Mobility   Outcome Measure  Help needed turning from your back to your side while in a flat bed without using bedrails?: A Little Help needed moving from lying on your back to sitting on the side of a flat bed without using bedrails?: A Little Help needed moving to and from a bed to a chair (including a  wheelchair)?: A Little Help needed standing up from a chair using your arms (e.g., wheelchair or bedside chair)?: A Little Help needed to walk in hospital room?: A Little Help needed climbing 3-5 steps with a railing? : A Little 6 Click Score: 18    End of Session Equipment Utilized During Treatment: Gait belt Activity Tolerance: Patient tolerated treatment well;Patient limited by pain Patient left: in chair;with call bell/phone within reach;with chair alarm set;with family/visitor present;with nursing/sitter in room Nurse Communication: Mobility status PT Visit Diagnosis: Muscle weakness (generalized) (M62.81);Difficulty in walking, not elsewhere classified (R26.2);Pain Pain - Right/Left: Right Pain - part of body: Knee     Time: 6124-3275 PT Time Calculation (min) (ACUTE ONLY): 25 min  Charges:  $Gait Training: 8-22 mins $Therapeutic Exercise: 8-22 mins $Therapeutic Activity: 8-22 mins                     Julaine Fusi PTA 09/09/20, 3:43 PM

## 2020-09-09 NOTE — Progress Notes (Signed)
Dressing to R knee is saturated with blood and oozed through the  Foam dressing. Dr. Milta Deiters inform,plan to change dressing.

## 2020-09-09 NOTE — Progress Notes (Signed)
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    PT Comments    Pt was supine in bed with supportive spouse at bedside. Agrees to PT session and is cooperative and pleasant.Requested not to get OOB due to just receiving suppository.  Pt performed and tolerated there ex handout without increase in pain. Constant education of importance of performing exercises throughout the day. Re-educated on not having knee bent for extended periods of time and to have towel roll under heel to promote extension. PT will continue efforts to progress pt to PLOF. Recommend HHPT at DC to address deficits while maximizing independence with ADLs.    Follow Up Recommendations  Home health PT     Equipment Recommendations  None recommended by PT;Other (comment) (pt reports having all equipment needs met.)       Precautions / Restrictions Precautions Precautions: Fall;Knee Precaution Booklet Issued: Yes (comment) Restrictions Weight Bearing Restrictions: Yes RLE Weight Bearing: Weight bearing as tolerated          Cognition Arousal/Alertness: Awake/alert Behavior During Therapy: WFL for tasks assessed/performed Overall Cognitive Status: Within Functional Limits for tasks assessed      General Comments: Pt requested not to get OOB this session. did perform Ther ex in bed      Exercises Total Joint Exercises Ankle Circles/Pumps: AROM;10 reps Quad Sets: Strengthening;10 reps Heel Slides: 5 reps;AROM Hip ABduction/ADduction: AAROM;15 reps Straight Leg Raises: AAROM;10 reps        Pertinent Vitals/Pain Pain Assessment: 0-10 Pain Score: 4  Pain Location: Rknee Pain Descriptors / Indicators: Burning Pain Intervention(s): Limited activity within patient's tolerance;Monitored during session;Premedicated before session;Repositioned;Ice applied           PT  Goals (current goals can now be found in the care plan section) Acute Rehab PT Goals Patient Stated Goal: go home Progress towards PT goals: Progressing toward goals    Frequency    BID      PT Plan Current plan remains appropriate       AM-PAC PT "6 Clicks" Mobility   Outcome Measure  Help needed turning from your back to your side while in a flat bed without using bedrails?: A Little Help needed moving from lying on your back to sitting on the side of a flat bed without using bedrails?: A Little Help needed moving to and from a bed to a chair (including a wheelchair)?: A Little Help needed standing up from a chair using your arms (e.g., wheelchair or bedside chair)?: A Little Help needed to walk in hospital room?: A Little Help needed climbing 3-5 steps with a railing? : A Little 6 Click Score: 18    End of Session Equipment Utilized During Treatment: Gait belt Activity Tolerance: Patient tolerated treatment well;Patient limited by pain Patient left: in bed;with call bell/phone within reach;with bed alarm set Nurse Communication: Mobility status PT Visit Diagnosis: Muscle weakness (generalized) (M62.81);Difficulty in walking, not elsewhere classified (R26.2);Pain Pain - Right/Left: Right Pain - part of body: Knee     Time: 1455-1515 PT Time Calculation (min) (ACUTE ONLY): 20 min  Charges:  $Therapeutic Exercise: 8-22 mins                     Julaine Fusi PTA 09/09/20, 3:33 PM

## 2020-09-10 MED ORDER — FLEET ENEMA 7-19 GM/118ML RE ENEM
1.0000 | ENEMA | Freq: Every day | RECTAL | Status: DC | PRN
  Administered 2020-09-10: 1 via RECTAL

## 2020-09-10 MED ORDER — BISACODYL 10 MG RE SUPP
10.0000 mg | Freq: Every day | RECTAL | Status: DC | PRN
  Administered 2020-09-10: 10 mg via RECTAL
  Filled 2020-09-10: qty 1

## 2020-09-10 MED ORDER — MAGNESIUM HYDROXIDE 400 MG/5ML PO SUSP
30.0000 mL | Freq: Every day | ORAL | Status: DC | PRN
  Filled 2020-09-10: qty 30

## 2020-09-10 NOTE — Progress Notes (Signed)
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    PT Comments    Split session today.  8:23-8:47 Participated in exercises as described below.  To EOB with c/o dizziness and general malaise.  Reported being very constipated.  RN in and gave suppository and pt remained in recliner.  11:41-12:04.  Returned and no BM yet but feeling better and wanting to walk.  2 laps around nursing pod with RW and slow but steady gait.  To bathroom after gait to sit on commode in hopes of having a BM.  Family in room.  Pt to call tech when ready to get up.   Follow Up Recommendations  Home health PT     Equipment Recommendations  None recommended by PT (pt has all equipment needs met)    Recommendations for Other Services       Precautions / Restrictions Precautions Precautions: Fall;Knee Precaution Booklet Issued: Yes (comment) Restrictions Weight Bearing Restrictions: Yes RLE Weight Bearing: Weight bearing as tolerated    Mobility  Bed Mobility Overal bed mobility: Needs Assistance Bed Mobility: Supine to Sit     Supine to sit: Min assist          Transfers Overall transfer level: Needs assistance Equipment used: Rolling walker (2 wheeled)   Sit to Stand: Min guard            Ambulation/Gait Ambulation/Gait assistance: Supervision;Min guard Gait Distance (Feet): 200 Feet Assistive device: Rolling walker (2 wheeled) Gait Pattern/deviations: Step-through pattern;Antalgic;Decreased weight shift to right;Decreased stance time - right;Decreased step length - left Gait velocity: decreased   General Gait Details: 2 laps around small pod   Stairs             Wheelchair Mobility    Modified Rankin (Stroke Patients Only)       Balance Overall balance assessment: Needs assistance Sitting-balance support: Feet supported Sitting  balance-Leahy Scale: Normal     Standing balance support: Bilateral upper extremity supported Standing balance-Leahy Scale: Good                              Cognition Arousal/Alertness: Awake/alert Behavior During Therapy: WFL for tasks assessed/performed Overall Cognitive Status: Within Functional Limits for tasks assessed                                        Exercises Total Joint Exercises Ankle Circles/Pumps: AROM;10 reps Quad Sets: Strengthening;10 reps Heel Slides: 5 reps;AROM Hip ABduction/ADduction: AAROM;15 reps Straight Leg Raises: AAROM;10 reps Goniometric ROM: 2-80    General Comments        Pertinent Vitals/Pain Pain Assessment: Faces Faces Pain Scale: Hurts even more Pain Location: Rknee Pain Descriptors / Indicators: Burning Pain Intervention(s): Limited activity within patient's tolerance;Monitored during session;Premedicated before session;Repositioned    Home Living                      Prior Function            PT Goals (current goals can now be found in the care plan section) Progress towards PT goals: Progressing toward goals    Frequency    BID      PT Plan Current plan remains appropriate  Co-evaluation              AM-PAC PT "6 Clicks" Mobility   Outcome Measure  Help needed turning from your back to your side while in a flat bed without using bedrails?: A Little Help needed moving from lying on your back to sitting on the side of a flat bed without using bedrails?: A Little Help needed moving to and from a bed to a chair (including a wheelchair)?: A Little Help needed standing up from a chair using your arms (e.g., wheelchair or bedside chair)?: A Little Help needed to walk in hospital room?: A Little Help needed climbing 3-5 steps with a railing? : A Little 6 Click Score: 18    End of Session Equipment Utilized During Treatment: Gait belt Activity Tolerance: Patient tolerated  treatment well;Patient limited by pain Patient left: in chair;with call bell/phone within reach;with chair alarm set;with family/visitor present;with nursing/sitter in room Nurse Communication: Mobility status PT Visit Diagnosis: Muscle weakness (generalized) (M62.81);Difficulty in walking, not elsewhere classified (R26.2);Pain Pain - Right/Left: Right Pain - part of body: Knee     Time: 1118-1204 PT Time Calculation (min) (ACUTE ONLY): 46 min  Charges:  $Gait Training: 23-37 mins $Therapeutic Exercise: 8-22 mins                    Chesley Noon, PTA 09/10/20, 12:18 PM

## 2020-09-10 NOTE — Progress Notes (Signed)
Subjective: 4 Days Post-Op Procedure(s) (LRB): TOTAL KNEE ARTHROPLASTY (Right) Because of the patient's constipation and overall poor feeling he will be kept overnight and discharged tomorrow hopefully.  Patient reports pain as mild.  Objective:   VITALS:   Vitals:   09/10/20 0530 09/10/20 0722  BP: (!) 149/69 (!) 153/71  Pulse: 81 70  Resp: 18 18  Temp: 98.9 F (37.2 C) 98.3 F (36.8 C)  SpO2: 97% 96%    Neurologically intact  LABS Recent Labs    09/08/20 0349  HGB 11.0*  HCT 32.5*  WBC 11.6*  PLT 233    No results for input(s): NA, K, BUN, CREATININE, GLUCOSE in the last 72 hours.  No results for input(s): LABPT, INR in the last 72 hours.   Assessment/Plan: 4 Days Post-Op Procedure(s) (LRB): TOTAL KNEE ARTHROPLASTY (Right)   Discharge home with home health tomorrow hopefully.

## 2020-09-10 NOTE — Progress Notes (Signed)
Subjective: 4 Days Post-Op Procedure(s) (LRB): TOTAL KNEE ARTHROPLASTY (Right)   The patient continues to complain of constipation and inability to have a bowel movement.  His knee pain is better.  He has been participating in PT.  He is not any further drainage.  Exam shows a dressing is dry today.  Patient reports pain as mild.  Objective:   VITALS:   Vitals:   09/10/20 0530 09/10/20 0722  BP: (!) 149/69 (!) 153/71  Pulse: 81 70  Resp: 18 18  Temp: 98.9 F (37.2 C) 98.3 F (36.8 C)  SpO2: 97% 96%    Neurologically intact Dorsiflexion/Plantar flexion intact Incision: scant drainage  LABS Recent Labs    09/08/20 0349  HGB 11.0*  HCT 32.5*  WBC 11.6*  PLT 233    No results for input(s): NA, K, BUN, CREATININE, GLUCOSE in the last 72 hours.  No results for input(s): LABPT, INR in the last 72 hours.   Assessment/Plan: 4 Days Post-Op Procedure(s) (LRB): TOTAL KNEE ARTHROPLASTY (Right)   Advance diet Up with therapy Discharge home with home health

## 2020-09-10 NOTE — Progress Notes (Signed)
Patient is A & O and able to make needs known. Wife has been at bedside throughout shift. Still having issues with constipation. Throughout shift patient has received bisacodyl suppository, colace, milk of magnesia, and senna-docusate without any relief. Fleet enema was administered approximately 1600. Patient has attempted to have urge to have BM without results. Approximately 1830 patient had a very small stool type 1.  Attention was brought to his right knee which is showing redness and heat to touch. This occurred yesterday as well and no differences.  Dr. Sabra Heck rounded on patient yesterday and today, redness/heat and constipation were reported and MD saw at bedside.

## 2020-09-11 ENCOUNTER — Inpatient Hospital Stay: Payer: Medicare HMO

## 2020-09-11 DIAGNOSIS — K5900 Constipation, unspecified: Secondary | ICD-10-CM | POA: Diagnosis present

## 2020-09-11 DIAGNOSIS — Z8616 Personal history of COVID-19: Secondary | ICD-10-CM | POA: Diagnosis not present

## 2020-09-11 DIAGNOSIS — Z96651 Presence of right artificial knee joint: Secondary | ICD-10-CM | POA: Diagnosis not present

## 2020-09-11 DIAGNOSIS — Z888 Allergy status to other drugs, medicaments and biological substances status: Secondary | ICD-10-CM | POA: Diagnosis not present

## 2020-09-11 DIAGNOSIS — Z8371 Family history of colonic polyps: Secondary | ICD-10-CM | POA: Diagnosis not present

## 2020-09-11 DIAGNOSIS — E785 Hyperlipidemia, unspecified: Secondary | ICD-10-CM | POA: Diagnosis present

## 2020-09-11 DIAGNOSIS — M1711 Unilateral primary osteoarthritis, right knee: Secondary | ICD-10-CM | POA: Diagnosis present

## 2020-09-11 DIAGNOSIS — K567 Ileus, unspecified: Secondary | ICD-10-CM | POA: Diagnosis not present

## 2020-09-11 DIAGNOSIS — E119 Type 2 diabetes mellitus without complications: Secondary | ICD-10-CM | POA: Diagnosis present

## 2020-09-11 DIAGNOSIS — J45909 Unspecified asthma, uncomplicated: Secondary | ICD-10-CM | POA: Diagnosis present

## 2020-09-11 DIAGNOSIS — Z8673 Personal history of transient ischemic attack (TIA), and cerebral infarction without residual deficits: Secondary | ICD-10-CM | POA: Diagnosis not present

## 2020-09-11 DIAGNOSIS — M25761 Osteophyte, right knee: Secondary | ICD-10-CM | POA: Diagnosis present

## 2020-09-11 DIAGNOSIS — Z9842 Cataract extraction status, left eye: Secondary | ICD-10-CM | POA: Diagnosis not present

## 2020-09-11 DIAGNOSIS — E1151 Type 2 diabetes mellitus with diabetic peripheral angiopathy without gangrene: Secondary | ICD-10-CM | POA: Diagnosis present

## 2020-09-11 DIAGNOSIS — Z85820 Personal history of malignant melanoma of skin: Secondary | ICD-10-CM | POA: Diagnosis not present

## 2020-09-11 DIAGNOSIS — Z880 Allergy status to penicillin: Secondary | ICD-10-CM | POA: Diagnosis not present

## 2020-09-11 DIAGNOSIS — K219 Gastro-esophageal reflux disease without esophagitis: Secondary | ICD-10-CM | POA: Diagnosis present

## 2020-09-11 DIAGNOSIS — D509 Iron deficiency anemia, unspecified: Secondary | ICD-10-CM | POA: Diagnosis present

## 2020-09-11 DIAGNOSIS — E871 Hypo-osmolality and hyponatremia: Secondary | ICD-10-CM | POA: Diagnosis not present

## 2020-09-11 DIAGNOSIS — I2510 Atherosclerotic heart disease of native coronary artery without angina pectoris: Secondary | ICD-10-CM | POA: Diagnosis present

## 2020-09-11 DIAGNOSIS — I10 Essential (primary) hypertension: Secondary | ICD-10-CM | POA: Diagnosis present

## 2020-09-11 LAB — BASIC METABOLIC PANEL
Anion gap: 13 (ref 5–15)
BUN: 25 mg/dL — ABNORMAL HIGH (ref 8–23)
CO2: 23 mmol/L (ref 22–32)
Calcium: 9.3 mg/dL (ref 8.9–10.3)
Chloride: 93 mmol/L — ABNORMAL LOW (ref 98–111)
Creatinine, Ser: 1.05 mg/dL (ref 0.61–1.24)
GFR, Estimated: >60 mL/min (ref >60)
Glucose, Bld: 251 mg/dL — ABNORMAL HIGH (ref 70–99)
Potassium: 3.5 mmol/L (ref 3.5–5.1)
Sodium: 129 mmol/L — ABNORMAL LOW (ref 135–145)

## 2020-09-11 LAB — CBC
HCT: 30.5 % — ABNORMAL LOW (ref 39.0–52.0)
Hemoglobin: 10.7 g/dL — ABNORMAL LOW (ref 13.0–17.0)
MCH: 30.6 pg (ref 26.0–34.0)
MCHC: 35.1 g/dL (ref 30.0–36.0)
MCV: 87.1 fL (ref 80.0–100.0)
Platelets: 285 10*3/uL (ref 150–400)
RBC: 3.5 MIL/uL — ABNORMAL LOW (ref 4.22–5.81)
RDW: 12.9 % (ref 11.5–15.5)
WBC: 8.3 10*3/uL (ref 4.0–10.5)
nRBC: 0 % (ref 0.0–0.2)

## 2020-09-11 LAB — GLUCOSE, CAPILLARY
Glucose-Capillary: 206 mg/dL — ABNORMAL HIGH (ref 70–99)
Glucose-Capillary: 264 mg/dL — ABNORMAL HIGH (ref 70–99)

## 2020-09-11 MED ORDER — INSULIN ASPART 100 UNIT/ML IJ SOLN
0.0000 [IU] | Freq: Three times a day (TID) | INTRAMUSCULAR | Status: DC
  Administered 2020-09-12: 2 [IU] via SUBCUTANEOUS
  Administered 2020-09-12: 3 [IU] via SUBCUTANEOUS
  Filled 2020-09-11 (×2): qty 1

## 2020-09-11 MED ORDER — INSULIN ASPART 100 UNIT/ML IJ SOLN: 0.0000 [IU] | Freq: Every day | INTRAMUSCULAR | Status: DC

## 2020-09-11 MED ORDER — SENNOSIDES-DOCUSATE SODIUM 8.6-50 MG PO TABS
2.0000 | ORAL_TABLET | Freq: Two times a day (BID) | ORAL | Status: DC
  Administered 2020-09-12: 2 via ORAL
  Filled 2020-09-11 (×2): qty 2

## 2020-09-11 MED ORDER — POLYETHYLENE GLYCOL 3350 17 G PO PACK
17.0000 g | PACK | Freq: Two times a day (BID) | ORAL | Status: DC
  Administered 2020-09-12: 17 g via ORAL
  Filled 2020-09-11 (×2): qty 1

## 2020-09-11 MED ORDER — SODIUM CHLORIDE 0.9 % IV SOLN: INTRAVENOUS | Status: DC

## 2020-09-11 MED ORDER — ALBUTEROL SULFATE HFA 108 (90 BASE) MCG/ACT IN AERS
2.0000 | INHALATION_SPRAY | RESPIRATORY_TRACT | Status: DC | PRN
  Filled 2020-09-11: qty 6.7

## 2020-09-11 MED ORDER — METFORMIN HCL 500 MG PO TABS
1000.0000 mg | ORAL_TABLET | Freq: Two times a day (BID) | ORAL | Status: DC
  Administered 2020-09-11 – 2020-09-12 (×2): 1000 mg via ORAL
  Filled 2020-09-11 (×2): qty 2

## 2020-09-11 MED ORDER — SODIUM CHLORIDE 0.9 % IV BOLUS
500.0000 mL | Freq: Once | INTRAVENOUS | Status: AC
  Administered 2020-09-11: 500 mL via INTRAVENOUS

## 2020-09-11 MED ORDER — HYDRALAZINE HCL 20 MG/ML IJ SOLN: 5.0000 mg | INTRAMUSCULAR | Status: DC | PRN

## 2020-09-11 MED ORDER — GLIPIZIDE ER 10 MG PO TB24
10.0000 mg | ORAL_TABLET | Freq: Every day | ORAL | Status: DC
  Administered 2020-09-12: 10 mg via ORAL
  Filled 2020-09-11: qty 1

## 2020-09-11 MED ORDER — IOHEXOL 9 MG/ML PO SOLN
500.0000 mL | ORAL | Status: AC
  Administered 2020-09-11 (×2): 500 mL via ORAL

## 2020-09-11 MED ORDER — METFORMIN HCL 500 MG PO TABS: 1000.0000 mg | ORAL_TABLET | Freq: Two times a day (BID) | ORAL | Status: DC

## 2020-09-11 NOTE — Progress Notes (Signed)
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    PT Comments    Pt ready for session.  Participated in exercises as described below.  To EOB with supervision.  Needs cues for hand placements during session.  Wife in and reviewed if need for cues at home continue.  Stands and is able to walk to PT gym on 1A with slow steady gait.  Occasional cues for walker position.  Extended seated rest on mat in gym.  While seated pt c/o cold sweats and stomach rumbling.  Assisted pt to wheelchair to return to room.  Transferred to commode and pt with wife in attendance and call bell in reach feeling better.  RN and tech aware of him on commode.    Will return after lunch for stair training.     Follow Up Recommendations  Home health PT     Equipment Recommendations  None recommended by PT (pt has all equipment needs met)    Recommendations for Other Services       Precautions / Restrictions Precautions Precautions: Fall;Knee Precaution Booklet Issued: Yes (comment) Restrictions Weight Bearing Restrictions: Yes RLE Weight Bearing: Weight bearing as tolerated    Mobility  Bed Mobility Overal bed mobility: Needs Assistance Bed Mobility: Supine to Sit     Supine to sit: Supervision          Transfers Overall transfer level: Needs assistance Equipment used: Rolling walker (2 wheeled) Transfers: Sit to/from Stand Sit to Stand: Min guard;Min assist         General transfer comment: cues for hand placement  Ambulation/Gait Ambulation/Gait assistance: Min guard Gait Distance (Feet): 130 Feet Assistive device: Rolling walker (2 wheeled) Gait Pattern/deviations: Step-through pattern;Antalgic;Decreased weight shift to right;Decreased stance time - right;Decreased step length - left Gait velocity: decreased       Stairs              Wheelchair Mobility    Modified Rankin (Stroke Patients Only)       Balance Overall balance assessment: Needs assistance Sitting-balance support: Feet supported Sitting balance-Leahy Scale: Normal     Standing balance support: Bilateral upper extremity supported Standing balance-Leahy Scale: Good                              Cognition Arousal/Alertness: Awake/alert Behavior During Therapy: WFL for tasks assessed/performed Overall Cognitive Status: Within Functional Limits for tasks assessed                                        Exercises Total Joint Exercises Ankle Circles/Pumps: AROM;10 reps Quad Sets: Strengthening;10 reps Heel Slides: 5 reps;AROM Hip ABduction/ADduction: AAROM;15 reps Straight Leg Raises: AAROM;10 reps Goniometric ROM: 2-70 - limited by increased pain and overall malaise    General Comments        Pertinent Vitals/Pain Pain Assessment: Faces Faces Pain Scale: Hurts little more Pain Location: Rknee Pain Descriptors / Indicators: Burning Pain Intervention(s): Monitored during session;Limited activity within patient's tolerance;Premedicated before session;Repositioned;Ice applied    Home Living                      Prior Function  PT Goals (current goals can now be found in the care plan section) Progress towards PT goals: Progressing toward goals    Frequency    BID      PT Plan Current plan remains appropriate    Co-evaluation              AM-PAC PT "6 Clicks" Mobility   Outcome Measure  Help needed turning from your back to your side while in a flat bed without using bedrails?: A Little Help needed moving from lying on your back to sitting on the side of a flat bed without using bedrails?: A Little Help needed moving to and from a bed to a chair (including a wheelchair)?: A Little Help needed standing up from a chair using your arms (e.g., wheelchair or bedside  chair)?: A Little Help needed to walk in hospital room?: A Little Help needed climbing 3-5 steps with a railing? : A Little 6 Click Score: 18    End of Session Equipment Utilized During Treatment: Gait belt Activity Tolerance: Patient tolerated treatment well;Patient limited by pain Patient left: in chair;with call bell/phone within reach;with chair alarm set;with family/visitor present;with nursing/sitter in room Nurse Communication: Mobility status PT Visit Diagnosis: Muscle weakness (generalized) (M62.81);Difficulty in walking, not elsewhere classified (R26.2);Pain Pain - Right/Left: Right Pain - part of body: Knee     Time: 8416-6063 PT Time Calculation (min) (ACUTE ONLY): 39 min  Charges:  $Gait Training: 23-37 mins $Therapeutic Exercise: 8-22 mins                    Chesley Noon, PTA 09/11/20, 12:41 PM

## 2020-09-11 NOTE — Progress Notes (Addendum)
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    PT Comments    Pt continues with no BM and general malaise.  Awaiting abdominal CT.  Will check back later but anticipate continued mobility tomorrow.  Emphasis on stair training next session.   Follow Up Recommendations  Home health PT     Equipment Recommendations  None recommended by PT (pt has all equipment needs met)    Recommendations for Other Services       Precautions / Restrictions Precautions Precautions: Fall;Knee Precaution Booklet Issued: Yes (comment) Restrictions Weight Bearing Restrictions: Yes RLE Weight Bearing: Weight bearing as tolerated    Mobility  Bed Mobility Overal bed mobility: Needs Assistance Bed Mobility: Supine to Sit     Supine to sit: Supervision          Transfers Overall transfer level: Needs assistance Equipment used: Rolling walker (2 wheeled) Transfers: Sit to/from Stand Sit to Stand: Min guard;Min assist         General transfer comment: cues for hand placement  Ambulation/Gait Ambulation/Gait assistance: Min guard Gait Distance (Feet): 130 Feet Assistive device: Rolling walker (2 wheeled) Gait Pattern/deviations: Step-through pattern;Antalgic;Decreased weight shift to right;Decreased stance time - right;Decreased step length - left Gait velocity: decreased       Stairs             Wheelchair Mobility    Modified Rankin (Stroke Patients Only)       Balance Overall balance assessment: Needs assistance Sitting-balance support: Feet supported Sitting balance-Leahy Scale: Normal     Standing balance support: Bilateral upper extremity supported Standing balance-Leahy Scale: Good                              Cognition Arousal/Alertness: Awake/alert Behavior During Therapy: WFL for tasks  assessed/performed Overall Cognitive Status: Within Functional Limits for tasks assessed                                        Exercises Total Joint Exercises Ankle Circles/Pumps: AROM;10 reps Quad Sets: Strengthening;10 reps Heel Slides: 5 reps;AROM Hip ABduction/ADduction: AAROM;15 reps Straight Leg Raises: AAROM;10 reps Goniometric ROM: 2-70 - limited by increased pain and overall malaise    General Comments        Pertinent Vitals/Pain Pain Assessment: Faces Faces Pain Scale: Hurts little more Pain Location: Rknee Pain Descriptors / Indicators: Burning Pain Intervention(s): Monitored during session;Limited activity within patient's tolerance;Premedicated before session;Repositioned;Ice applied    Home Living                      Prior Function            PT Goals (current goals can now be found in the care plan section) Progress towards PT goals: Progressing toward goals    Frequency    BID      PT Plan Current plan remains appropriate    Co-evaluation              AM-PAC PT "6 Clicks" Mobility   Outcome Measure  Help needed turning from your back to your side while in a flat bed without using bedrails?: A Little Help needed moving from lying on your back to  sitting on the side of a flat bed without using bedrails?: A Little Help needed moving to and from a bed to a chair (including a wheelchair)?: A Little Help needed standing up from a chair using your arms (e.g., wheelchair or bedside chair)?: A Little Help needed to walk in hospital room?: A Little Help needed climbing 3-5 steps with a railing? : A Little 6 Click Score: 18    End of Session Equipment Utilized During Treatment: Gait belt Activity Tolerance: Patient tolerated treatment well;Patient limited by pain Patient left: in chair;with call bell/phone within reach;with chair alarm set;with family/visitor present;with nursing/sitter in room Nurse Communication:  Mobility status PT Visit Diagnosis: Muscle weakness (generalized) (M62.81);Difficulty in walking, not elsewhere classified (R26.2);Pain Pain - Right/Left: Right Pain - part of body: Knee     Time: 6433-2951 PT Time Calculation (min) (ACUTE ONLY): 39 min  Charges:  $Gait Training: 23-37 mins $Therapeutic Exercise: 8-22 mins                    Chesley Noon, PTA 09/11/20, 2:46 PM

## 2020-09-11 NOTE — Consult Note (Addendum)
Medical Consultation   TORENCE Hall  [id number]  DOB: 07-22-1940  DOA: 09/06/2020  PCP: Baxter Hire, MD   Outpatient Specialists:    Requesting physician: -Dr. Harlow Mares of [facility]  Reason for consultation: -severe constipaiton   History of Present Illness: Connor Hall is an 80 y.o. male hypertension, hyperlipidemia, diabetes mellitus, asthma, TIA, GERD, anxiety, OSA not on CPAP, PVD, COVID-19 infection 12/2019, diverticulitis, CAD, anemia, melanoma, who was admitted for right knee replacement by ortho team. We are asked to consult due to severe constipation.  Patient underwent successful right knee replacement on 5/4.  Patient is 4 days post-Op of surgery.  Patient states that he has been constipated before his surgery.  Last normal bowel movement was on 5/3.  His constipation has worsened after surgery due to pain medication use.  Patient has nausea, dry heaves and abdominal distention.  He also has central and upper abdominal discomfort. He burps a lot, and still pass gas.  Patient is currently on Dulcolax, Colace, magnesium hydroxide and Senokot. Patient was also treated with fleets enema without significant improvement.  Patient denies chest pain, cough, shortness breath, fever or chills.  No symptoms of UTI.  Patient has some mild tenderness in the surgical site.  No tenderness in the calf areas.  Lab: Patient had negative COVID PCR 03/19/2021.  WBC 8.3, renal function okay, sodium 129.  Vital sign: Blood pressure 145/60, heart rate 77, RR 18, oxygen saturation 97% on room air, temperature normal.  Review of Systems:   General: no fevers, chills, no changes in body weight, no changes in appetite Skin: no rash HEENT: no blurry vision, hearing changes or sore throat Pulm: no dyspnea, coughing, wheezing CV: no chest pain, palpitations, shortness of breath Abd: Nausea, constipation, abdominal discomfort.  No diarrhea. GU: no dysuria, hematuria,  polyuria Ext: no arthralgias, myalgias. S/p of right TKR. Neuro: no weakness, numbness, or tingling    Past Medical History: Past Medical History:  Diagnosis Date  . Anemia   . Anxiety   . Arthritis   . Asthma    WELL CONTROLLED, had as a child  . CAD (coronary artery disease)    MANAGING WITH MEDICINE-NO STENTS  . Cancer (HCC)    MELANOMA (NOSE, FACE AND HANDS), BASAL CELL  . Cervicalgia   . Depression   . Diabetes mellitus without complication ([place])   . Difficult intubation    damage to throat  . Diverticulitis   . Dyspnea   . GERD (gastroesophageal reflux disease)   . Headache   . Hernia of abdominal cavity   . History of 2019 novel coronavirus disease (COVID-19) 12/2019  . History of hiatal hernia   . Hyperlipidemia   . Hypertension   . PVD (peripheral vascular disease) ([place])   . Sleep apnea    NO CPAP-PT DENIES SLEEP APNEA BUT STUDY WAS DONE AND PT WAS TOLD HE HAD SLEEP APNEA  . TIA (transient ischemic attack) 03/19/2020    Past Surgical History: Past Surgical History:  Procedure Laterality Date  . APPENDECTOMY    . [place]    . CARPAL TUNNEL RELEASE Bilateral   . CATARACT EXTRACTION W/PHACO Right 04/03/2016   Procedure: CATARACT EXTRACTION PHACO AND INTRAOCULAR LENS PLACEMENT (IOC);  Surgeon: Estill Cotta, MD;  Location: [facility];  Service: Ophthalmology;  Laterality: Right;  [place] 1.37  AP% 25.8 CDE 44.67 Fluid pack lot # [id number]  . CATARACT EXTRACTION W/PHACO Left 05/29/2016   Procedure: CATARACT EXTRACTION PHACO AND INTRAOCULAR LENS PLACEMENT (IOC);  Surgeon: Estill Cotta, MD;  Location: [facility];  Service: Ophthalmology;  Laterality: Left;  [place] 01:16 AP% 23.3 CDE 31.18 Fluid pack lot # [id number]  . COLONOSCOPY    . COLONOSCOPY WITH PROPOFOL N/A 01/15/2017   Procedure: COLONOSCOPY WITH PROPOFOL;  Surgeon: Manya Silvas, MD;  Location: [facility];  Service: Endoscopy;  Laterality: N/A;   . ESOPHAGOGASTRODUODENOSCOPY    . ESOPHAGOGASTRODUODENOSCOPY (EGD) WITH PROPOFOL N/A 01/15/2017   Procedure: ESOPHAGOGASTRODUODENOSCOPY (EGD) WITH PROPOFOL;  Surgeon: Manya Silvas, MD;  Location: [facility] END[facility]COPY;  Service: Endoscopy;  Laterality: N/A;  . INGUINAL HERNIA REPAIR Right 05/19/2019   Procedure: HERNIA REPAIR INGUINAL ADULT;  Surgeon: Robert Bellow, MD;  Location: [facility];  Service: General;  Laterality: Right;  . INGUINAL HERNIA REPAIR Left 06/21/2020   Procedure: HERNIA REPAIR INGUINAL ADULT;  Surgeon: Robert Bellow, MD;  Location: [facility];  Service: General;  Laterality: Left;  . KNEE ARTHROSCOPY Left 08/30/2015   Procedure: ARTHROSCOPY KNEE LEFT, partial medial menisectomy, chondral debridement;  Surgeon: Leanor Kail, MD;  Location: [facility];  Service: Orthopedics;  Laterality: Left;  . LEFT HEART CATH AND CORONARY ANGIOGRAPHY Left 11/24/2017   Procedure: LEFT HEART CATH AND CORONARY ANGIOGRAPHY;  Surgeon: Corey Skains, MD;  Location: [facility];  Service: Cardiovascular;  Laterality: Left;  . MOHS SURGERY    . POLYPECTOMY    . ROTATOR CUFF REPAIR Right   . TOTAL KNEE ARTHROPLASTY Right 09/06/2020   Procedure: TOTAL KNEE ARTHROPLASTY;  Surgeon: Lovell Sheehan, MD;  Location: [facility];  Service: Orthopedics;  Laterality: Right;  . TRIGGER FINGER RELEASE       Allergies:   Allergies  Allergen Reactions  . Ace Inhibitors Swelling    Face and lips  . Penicillins Other (See Comments)    CHILDHOOD ALLERGY  Has patient had a PCN reaction causing immediate rash, facial/tongue/throat swelling, SOB or lightheadedness with hypotension: Unknown Has patient had a PCN reaction causing severe rash involving mucus membranes or skin necrosis: Unknown Has patient had a PCN reaction that required hospitalization: No Has patient had a PCN reaction occurring within the last 10 years: No If all of the above answers are "NO", then may proceed with Cephalosporin  use.    . Aspirin Hives and Rash  . Other Palpitations    MSG-headache     Social History:  reports that he quit smoking about 45 years ago. His smoking use included cigarettes. He has a 6.50 pack-year smoking history. He has never used smokeless tobacco. He reports previous alcohol use. He reports that he does not use drugs.   Family History: Family History  Problem Relation Age of Onset  . Pancreatic cancer Mother   . Heart attack Father     Physical Exam: Vitals:   09/10/20 1952 09/11/20 0515 09/11/20 0732 09/11/20 1008  BP: (!) 156/66 (!) 152/66 (!) 157/69 (!) 145/60  Pulse: 75 70 73 77  Resp: 17 16 16 18   Temp: 98 F (36.7 C) 98.4 F (36.9 C) 99.2 F (37.3 C) 97.7 F (36.5 C)  TempSrc:  Oral  Oral  SpO2: 94% 96% 93% 97%  Weight:      Height:        general: Not in acute distress HEENT: PERRL, EOMI, no scleral icterus, No JVD or bruit Cardiac: S1/S2,  RRR, No murmurs, gallops or rubs Pulm:  No rales, wheezing, rhonchi or rubs. Abd: Soft, mildly distended, nontender, no rebound pain, no organomegaly, high-pitched BS present Ext: No edema. 2+DP/PT pulse bilaterally Musculoskeletal: s/p of right TKR with mild tenderness in the surgical site Skin: No rashes.  Neuro: Alert and oriented X3, cranial nerves II-XII grossly intact Psych: Patient is not psychotic, no suicidal or hemocidal ideation.  Data reviewed:  I have personally reviewed following labs and imaging studies Labs:  CBC: Recent Labs  Lab 09/07/20 0414 09/08/20 0349 09/11/20 1419  WBC 8.8 11.6* 8.3  HGB 10.1* 11.0* 10.7*  HCT 29.6* 32.5* 30.5*  MCV 90.8 89.8 87.1  PLT 203 233 474    Basic Metabolic Panel: Recent Labs  Lab 09/07/20 0414 09/11/20 1419  NA 133* 129*  K 4.1 3.5  CL 101 93*  CO2 23 23  GLUCOSE 225* 251*  BUN 21 25*  CREATININE 1.03 1.05  CALCIUM 8.7* 9.3   GFR Estimated Creatinine Clearance: 62.6 mL/min (by C-G formula based on SCr of 1.05 mg/dL). Liver Function  Tests: No results for input(s): AST, ALT, ALKPHOS, BILITOT, PROT, ALBUMIN in the last 168 hours. No results for input(s): LIPASE, AMYLASE in the last 168 hours. No results for input(s): AMMONIA in the last 168 hours. Coagulation profile No results for input(s): INR, PROTIME in the last 168 hours.  Cardiac Enzymes: No results for input(s): CKTOTAL, CKMB, CKMBINDEX, TROPONINI in the last 168 hours. BNP: Invalid input(s): POCBNP CBG: Recent Labs  Lab 09/06/20 0921 09/06/20 1259 09/08/20 1226  GLUCAP 188* 168* 215*   D-Dimer No results for input(s): DDIMER in the last 72 hours. Hgb A1c No results for input(s): HGBA1C in the last 72 hours. Lipid Profile No results for input(s): CHOL, HDL, LDLCALC, TRIG, CHOLHDL, LDLDIRECT in the last 72 hours. Thyroid function studies No results for input(s): TSH, T4TOTAL, T3FREE, THYROIDAB in the last 72 hours.  Invalid input(s): FREET3 Anemia work up No results for input(s): VITAMINB12, FOLATE, FERRITIN, TIBC, IRON, RETICCTPCT in the last 72 hours. Urinalysis    Component Value Date/Time   COLORURINE YELLOW (A) 09/04/2020 0856   APPEARANCEUR CLEAR (A) 09/04/2020 0856   LABSPEC 1.014 09/04/2020 0856   PHURINE 7.0 09/04/2020 0856   GLUCOSEU NEGATIVE 09/04/2020 0856   HGBUR NEGATIVE 09/04/2020 0856   BILIRUBINUR NEGATIVE 09/04/2020 0856   KETONESUR NEGATIVE 09/04/2020 0856   PROTEINUR NEGATIVE 09/04/2020 0856   NITRITE NEGATIVE 09/04/2020 0856   LEUKOCYTESUR NEGATIVE 09/04/2020 0856     Microbiology Recent Results (from the past 240 hour(s))  SARS CORONAVIRUS 2 (TAT 6-24 HRS) Nasopharyngeal Nasopharyngeal Swab     Status: None   Collection Time: 09/04/20  9:13 AM   Specimen: Nasopharyngeal Swab  Result Value Ref Range Status   SARS Coronavirus 2 NEGATIVE NEGATIVE Final    Comment: (NOTE) SARS-CoV-2 target nucleic acids are NOT DETECTED.  The SARS-CoV-2 RNA is generally detectable in upper and lower respiratory specimens during the  acute phase of infection. Negative results do not preclude SARS-CoV-2 infection, do not rule out co-infections with other pathogens, and should not be used as the sole basis for treatment or other patient management decisions. Negative results must be combined with clinical observations, patient history, and epidemiological information. The expected result is Negative.  Fact Sheet for Patients: SugarRoll.be  Fact Sheet for Healthcare Providers: https://www.woods-mathews.com/  This test is not yet approved or cleared by the [place] FDA and  has been authorized for detection and/or diagnosis of  SARS-CoV-2 by FDA under an Emergency Use Authorization (EUA). This EUA will remain  in effect (meaning this test can be used) for the duration of the COVID-19 declaration under Se ction 564(b)(1) of the Act, 21 U.S.C. section 360bbb-3(b)(1), unless the authorization is terminated or revoked sooner.  Performed at [facility], [place] [address] [place], [place] [address]        Inpatient Medications:   Scheduled Meds: . amLODipine  10 mg Oral BH-q7a  . ferrous sulfate  325 mg Oral Q breakfast  . insulin aspart  0-5 Units Subcutaneous QHS  . insulin aspart  0-9 Units Subcutaneous TID WC  . pantoprazole  20 mg Oral q AM  . pravastatin  80 mg Oral QHS  . senna-docusate  1 tablet Oral QODAY  . traMADol  50 mg Oral Q6H  . vitamin B-12  1,000 mcg Oral Daily   Continuous Infusions: . sodium chloride    . lactated ringers Stopped (09/07/20 5361)  . sodium chloride       Radiological Exams on Admission: No results found.  Impression/Recommendations Principal Problem:   History of total knee arthroplasty, right Active Problems:   Iron deficiency anemia   Coronary artery disease involving native coronary artery of native heart   Hyperlipidemia   Hypertension   GERD (gastroesophageal reflux disease)   Diabetes mellitus without  complication (HCC)   TIA (transient ischemic attack)   Constipation   Asthma   Hyponatremia  History of total knee arthroplasty, right: 4 days post-Op.  -pain control per ortho team -PT/OT  Constipation: Patient has severe constipation, not responding to routine management.  Most likely due to pain medication and iron supplement use.  Will get CT scan to r/o obstruction. If no obstruction, will do enema and aggressive laxative treatment -Follow-up CT scan of abdomen/pelvis  Addendum; CT scan negative for obstruction, but showed colonic ileus 1. Mild gaseous distension of ascending, transverse, and proximal descending colon, without associated colonic wall thickening or inflammation. Administered enteric contrast reaches the colon. Findings may represent mild colonic ileus. 2. Distal colonic diverticulosis without diverticulitis. 3. Prominent prostate gland.  -will continue Fleet enema prn -miralax bid -senokot 2 tab bid  Coronary artery disease involving native coronary artery of native heart: No CP. -continue lipitor  Hyperlipidemia -Pravastatin  Hypertension -As needed IV hydralazine -Amlodipine -Hold HCTZ due to hyponatremia  Diabetes mellitus without complication ([place]): Recent A1c 7.0.  Patient is currently taking glipizide and metformin in hospital.  Blood sugar 215 on 5/6. Pt refused SSI. -continue home metformin and glipizid  TIA (transient ischemic attack) -Pravastatin -Plavix is on hold  Asthma: Stable -As needed albuterol  Iron deficiency anemia: Hemoglobin 11.0 on 5/6 -Continue iron supplement  GERD: -Protonix  Hyponatremia: Na 129. Likely due to decreased oral intake -Hold HCTZ -500 cc normal saline bolus, then 75 cc/h -Follow-up BMP in morning    Thank you for this consultation.  Our [facility] hospitalist team will follow the patient with you.   Time Spent:  20 min  Ivor Costa M.D. [facility] 09/11/2020, 3:08 PM

## 2020-09-11 NOTE — Progress Notes (Signed)
  Subjective:  Patient reports pain as moderate.  C/O pain below knee to toes.  Objective:   VITALS:   Vitals:   09/10/20 1952 09/11/20 0515 09/11/20 0732 09/11/20 1008  BP: (!) 156/66 (!) 152/66 (!) 157/69 (!) 145/60  Pulse: 75 70 73 77  Resp: 17 16 16 18   Temp: 98 F (36.7 C) 98.4 F (36.9 C) 99.2 F (37.3 C) 97.7 F (36.5 C)  TempSrc:  Oral  Oral  SpO2: 94% 96% 93% 97%  Weight:      Height:        PHYSICAL EXAM:  Neurologically intact ABD soft Neurovascular intact Sensation intact distally Intact pulses distally Dorsiflexion/Plantar flexion intact Incision: dressing C/D/I No cellulitis present Compartment soft  LABS  No results found for this or any previous visit (from the past 24 hour(s)).  No results found.  Assessment/Plan: 5 Days Post-Op   Active Problems:   History of total knee arthroplasty, right   Advance diet Up with therapy  Patient struggling with constipation, will consult medicine D/C to home with HHPT once constipation issue resolved   Carlynn Spry , PA-C 09/11/2020, 1:49 PM

## 2020-09-12 DIAGNOSIS — Z96651 Presence of right artificial knee joint: Secondary | ICD-10-CM

## 2020-09-12 LAB — MAGNESIUM: Magnesium: 2.1 mg/dL (ref 1.7–2.4)

## 2020-09-12 LAB — CBC
HCT: 29.4 % — ABNORMAL LOW (ref 39.0–52.0)
Hemoglobin: 10.2 g/dL — ABNORMAL LOW (ref 13.0–17.0)
MCH: 30.7 pg (ref 26.0–34.0)
MCHC: 34.7 g/dL (ref 30.0–36.0)
MCV: 88.6 fL (ref 80.0–100.0)
Platelets: 272 10*3/uL (ref 150–400)
RBC: 3.32 MIL/uL — ABNORMAL LOW (ref 4.22–5.81)
RDW: 13 % (ref 11.5–15.5)
WBC: 7.9 10*3/uL (ref 4.0–10.5)
nRBC: 0 % (ref 0.0–0.2)

## 2020-09-12 LAB — BASIC METABOLIC PANEL
Anion gap: 12 (ref 5–15)
BUN: 24 mg/dL — ABNORMAL HIGH (ref 8–23)
CO2: 23 mmol/L (ref 22–32)
Calcium: 8.8 mg/dL — ABNORMAL LOW (ref 8.9–10.3)
Chloride: 95 mmol/L — ABNORMAL LOW (ref 98–111)
Creatinine, Ser: 1.19 mg/dL (ref 0.61–1.24)
GFR, Estimated: >60 mL/min (ref >60)
Glucose, Bld: 314 mg/dL — ABNORMAL HIGH (ref 70–99)
Potassium: 4.1 mmol/L (ref 3.5–5.1)
Sodium: 130 mmol/L — ABNORMAL LOW (ref 135–145)

## 2020-09-12 LAB — PHOSPHORUS: Phosphorus: 2.3 mg/dL — ABNORMAL LOW (ref 2.5–4.6)

## 2020-09-12 LAB — GLUCOSE, CAPILLARY
Glucose-Capillary: 204 mg/dL — ABNORMAL HIGH (ref 70–99)
Glucose-Capillary: 197 mg/dL — ABNORMAL HIGH (ref 70–99)

## 2020-09-12 NOTE — Plan of Care (Signed)
  Problem: Education: Goal: Knowledge of General Education information will improve Description: Including pain rating scale, medication(s)/side effects and non-pharmacologic comfort measures 09/12/2020 1420 by Nayelis Bonito Bet, LPN Outcome: Adequate for Discharge 09/12/2020 1420 by Isabelly Kobler Bet, LPN Outcome: Progressing 09/12/2020 1024 by Marika Mahaffy Bet, LPN Outcome: Progressing   Problem: Health Behavior/Discharge Planning: Goal: Ability to manage health-related needs will improve 09/12/2020 1420 by Darnetta Kesselman Bet, LPN Outcome: Adequate for Discharge 09/12/2020 1420 by Jennfer Gassen Bet, LPN Outcome: Progressing 09/12/2020 1024 by Donice Alperin Bet, LPN Outcome: Progressing   Problem: Clinical Measurements: Goal: Ability to maintain clinical measurements within normal limits will improve 09/12/2020 1420 by Armond Cuthrell Bet, LPN Outcome: Adequate for Discharge 09/12/2020 1420 by Ionna Avis Bet, LPN Outcome: Progressing 09/12/2020 1024 by Athen Riel Bet, LPN Outcome: Progressing Goal: Will remain free from infection 09/12/2020 1420 by Jayna Mulnix Bet, LPN Outcome: Adequate for Discharge 09/12/2020 1420 by Carlo Guevarra Bet, LPN Outcome: Progressing 09/12/2020 1024 by Tremell Reimers Bet, LPN Outcome: Progressing Goal: Diagnostic test results will improve 09/12/2020 1420 by Quinten Allerton Bet, LPN Outcome: Adequate for Discharge 09/12/2020 1420 by Javeion Cannedy Bet, LPN Outcome: Progressing 09/12/2020 1024 by Lisette Mancebo Bet, LPN Outcome: Progressing Goal: Respiratory complications will improve 09/12/2020 1420 by Lowanda Cashaw Bet, LPN Outcome: Adequate for Discharge 09/12/2020 1420 by Sheyli Horwitz Bet, LPN Outcome: Progressing 09/12/2020 1024 by Detrich Rakestraw Bet, LPN Outcome: Progressing Goal: Cardiovascular complication will be avoided 09/12/2020 1420 by Mallarie Voorhies Bet, LPN Outcome: Adequate for Discharge 09/12/2020 1420 by Eligio Angert Bet, LPN Outcome: Progressing 09/12/2020 1024 by Taleyah Hillman Bet, LPN Outcome:  Progressing   Problem: Education: Goal: Knowledge of the prescribed therapeutic regimen will improve 09/12/2020 1420 by Suetta Hoffmeister Bet, LPN Outcome: Adequate for Discharge 09/12/2020 1420 by Ercil Cassis Bet, LPN Outcome: Progressing 09/12/2020 1024 by Burgundy Matuszak Bet, LPN Outcome: Progressing   Problem: Activity: Goal: Ability to avoid complications of mobility impairment will improve 09/12/2020 1420 by Alissia Lory Bet, LPN Outcome: Adequate for Discharge 09/12/2020 1420 by Jareli Highland Bet, LPN Outcome: Progressing 09/12/2020 1024 by Deondrea Aguado Bet, LPN Outcome: Progressing Goal: Range of joint motion will improve 09/12/2020 1420 by Zayleigh Stroh Bet, LPN Outcome: Adequate for Discharge 09/12/2020 1420 by Emerie Vanderkolk Bet, LPN Outcome: Progressing 09/12/2020 1024 by Janaye Corp Bet, LPN Outcome: Progressing   Problem: Clinical Measurements: Goal: Postoperative complications will be avoided or minimized 09/12/2020 1420 by Cleon Signorelli Bet, LPN Outcome: Adequate for Discharge 09/12/2020 1420 by Mikaelyn Arthurs Bet, LPN Outcome: Progressing 09/12/2020 1024 by Skye Rodarte Bet, LPN Outcome: Progressing   Problem: Pain Management: Goal: Pain level will decrease with appropriate interventions 09/12/2020 1420 by Estel Scholze Bet, LPN Outcome: Adequate for Discharge 09/12/2020 1420 by Amayah Staheli Bet, LPN Outcome: Progressing 09/12/2020 1024 by Nitesh Pitstick Bet, LPN Outcome: Progressing   Problem: Acute Rehab PT Goals(only PT should resolve) Goal: Patient Will Transfer Sit To/From Stand Outcome: Adequate for Discharge Goal: Pt Will Ambulate Outcome: Adequate for Discharge Goal: Pt Will Go Up/Down Stairs Outcome: Adequate for Discharge Goal: Pt/caregiver will Perform Home Exercise Program Outcome: Adequate for Discharge

## 2020-09-12 NOTE — Plan of Care (Signed)
  Problem: Education: Goal: Knowledge of General Education information will improve Description: Including pain rating scale, medication(s)/side effects and non-pharmacologic comfort measures Outcome: Progressing   Problem: Health Behavior/Discharge Planning: Goal: Ability to manage health-related needs will improve Outcome: Progressing   Problem: Clinical Measurements: Goal: Ability to maintain clinical measurements within normal limits will improve Outcome: Progressing Goal: Will remain free from infection Outcome: Progressing Goal: Diagnostic test results will improve Outcome: Progressing Goal: Respiratory complications will improve Outcome: Progressing Goal: Cardiovascular complication will be avoided Outcome: Progressing   Problem: Education: Goal: Knowledge of the prescribed therapeutic regimen will improve Outcome: Progressing   Problem: Activity: Goal: Ability to avoid complications of mobility impairment will improve Outcome: Progressing Goal: Range of joint motion will improve Outcome: Progressing   Problem: Clinical Measurements: Goal: Postoperative complications will be avoided or minimized Outcome: Progressing   Problem: Pain Management: Goal: Pain level will decrease with appropriate interventions Outcome: Progressing

## 2020-09-12 NOTE — Progress Notes (Signed)
Discharge Instruction: Reviewed discharge instructions with pt. Pt verbalized understanding. Iv cath intact upon removal. Dressing intact. Pt d/ced with polar care and personal belongings. Staff wheeled pt out. Pt transported to home via family vehicle.

## 2020-09-12 NOTE — Progress Notes (Signed)
Physical Therapy Treatment Patient Details Name: Connor Hall MRN: [id number] DOB: 10-20-1940 Today's Date: 09/12/2020    History of Present Illness 80 y/o male s/p R TKA 09/06/20.    PT Comments    Pt continues to be eager to do all he can but struggled with pain and hesitancy with most aspects of session.  He did show good effort but continues to lack POD6 appropriate ROM (lacking a few degrees of extension and needed considerable repeated gentle overpressure ROM exercises to attain mid 80s flexion).  Pt was able to do 2 bouts of ~150 ft of ambulation, and with heavy cuing/encouragement and increased time he was able to attain a more consistent cadence and walker motion - though still hesitant, especially with R LE TKE and full weight acceptance.   Pt managed steps safely and w/o direct assist.    Follow Up Recommendations  Home health PT     Equipment Recommendations  None recommended by PT    Recommendations for Other Services       Precautions / Restrictions Precautions Precautions: Fall;Knee Precaution Booklet Issued: Yes (comment) Restrictions Weight Bearing Restrictions: No RLE Weight Bearing: Weight bearing as tolerated    Mobility  Bed Mobility Overal bed mobility: Needs Assistance Bed Mobility: Supine to Sit     Supine to sit: Supervision     General bed mobility comments: Pt continues to need UEs and R LE to assist with getting LEs to EOB. Able to use UEs to push up to sitting.    Transfers Overall transfer level: Needs assistance Equipment used: Rolling walker (2 wheeled) Transfers: Sit to/from Stand Sit to Stand: Min guard         General transfer comment: Pt was able to rise to standing from low surfaces w/o direct assist, heavy UE assist  Ambulation/Gait Ambulation/Gait assistance: Min guard Gait Distance (Feet): 150 Feet Assistive device: Rolling walker (2 wheeled)       General Gait Details: 2 bouts of ~150 ft, intially with slow, stop-go  cadence, but with cuing and light assist was able to attain a nearly consistent reciprocal cadence.  Lacking TKE on R even with extra cuing and encouragement to use WBing mechanics to assist with this   Stairs Stairs: Yes Stairs assistance: Min guard Stair Management: One rail Left;Sideways Number of Stairs: 4 General stair comments: Pt was able to negotiate up/down steps w/o direct assist.  Plenty of cuing, wife present and also educated on appropriate sequencing, Immunologist    Modified Rankin (Stroke Patients Only)       Balance Overall balance assessment: Needs assistance Sitting-balance support: Feet supported Sitting balance-Leahy Scale: Normal     Standing balance support: Bilateral upper extremity supported Standing balance-Leahy Scale: Good Standing balance comment: reliant on walker, lacks confidence and TKE on R during WBing                            Cognition Arousal/Alertness: Awake/alert Behavior During Therapy: WFL for tasks assessed/performed Overall Cognitive Status: Within Functional Limits for tasks assessed                                        Exercises Total Joint Exercises Ankle Circles/Pumps: AROM;10 reps Quad Sets: 10 reps;Strengthening Short Arc Quad: AAROM;15 reps (struggled to engage quad very well against gravity) Heel Slides: AAROM;10  reps Hip ABduction/ADduction: AROM;15 reps Straight Leg Raises: AAROM;10 reps;AROM (final 3 reps with some AROM, lacking TKE during effort) Long Arc Quad: AAROM;10 reps Knee Flexion: PROM;10 reps Goniometric ROM: 3-84    General Comments        Pertinent Vitals/Pain Pain Assessment: 0-10 Pain Score: 7     Home Living                      Prior Function            PT Goals (current goals can now be found in the care plan section) Progress towards PT goals: Progressing toward goals    Frequency    BID      PT Plan Current plan remains  appropriate    Co-evaluation              AM-PAC PT "6 Clicks" Mobility   Outcome Measure  Help needed turning from your back to your side while in a flat bed without using bedrails?: A Little Help needed moving from lying on your back to sitting on the side of a flat bed without using bedrails?: A Little Help needed moving to and from a bed to a chair (including a wheelchair)?: A Little Help needed standing up from a chair using your arms (e.g., wheelchair or bedside chair)?: A Little Help needed to walk in hospital room?: A Little Help needed climbing 3-5 steps with a railing? : A Little 6 Click Score: 18    End of Session Equipment Utilized During Treatment: Gait belt Activity Tolerance: Patient tolerated treatment well;Patient limited by pain Patient left: in chair;with call bell/phone within reach;with chair alarm set;with family/visitor present;with nursing/sitter in room Nurse Communication: Mobility status PT Visit Diagnosis: Muscle weakness (generalized) (M62.81);Difficulty in walking, not elsewhere classified (R26.2);Pain Pain - Right/Left: Right Pain - part of body: Knee     Time: 3419-6222 PT Time Calculation (min) (ACUTE ONLY): 55 min  Charges:  $Gait Training: 23-37 mins $Therapeutic Exercise: 8-22 mins $Therapeutic Activity: 8-22 mins                     Kreg Shropshire, DPT 09/12/2020, 1:47 PM

## 2020-09-12 NOTE — Discharge Summary (Signed)
Physician Discharge Summary  Patient ID: Connor Hall MRN: [id number] DOB/AGE: 09-Jan-1941 80 y.o.  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 Anemia   . Anxiety   . Arthritis   . Asthma    WELL CONTROLLED, had as a child  . CAD (coronary artery disease)    MANAGING WITH MEDICINE-NO STENTS  . Cancer (HCC)    MELANOMA (NOSE, FACE AND HANDS), BASAL CELL  . Cervicalgia   . Depression   . Diabetes mellitus without complication ([place])   . Difficult intubation    damage to throat  . Diverticulitis   . Dyspnea   . GERD (gastroesophageal reflux disease)   . Headache   . Hernia of abdominal cavity   . History of 2019 novel coronavirus disease (COVID-19) 12/2019  . History of hiatal hernia   . Hyperlipidemia   . Hypertension   . PVD (peripheral vascular disease) ([place])   . Sleep apnea    NO CPAP-PT DENIES SLEEP APNEA BUT STUDY WAS DONE AND PT WAS TOLD HE HAD SLEEP APNEA  . TIA (transient ischemic attack) 03/19/2020    Surgeries: Procedure(s): TOTAL KNEE ARTHROPLASTY on 09/06/2020   Consultants (if any):   Discharged Condition: Improved  Hospital Course: Connor Hall is an 80 y.o. 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    He was given perioperative antibiotics:  Anti-infectives (From admission, onward)   Start     Dose/Rate Route Frequency Ordered Stop   09/06/20 1700  ceFAZolin (ANCEF) IVPB 2g/100 mL premix        2 g 200 mL/hr over 30 Minutes Intravenous Every 6 hours 09/06/20 1556 09/06/20 2356   09/06/20 0910  clindamycin (CLEOCIN) 900 MG/50ML IVPB       Note to Pharmacy: Maryagnes Amos   : cabinet override      09/06/20 0910 09/06/20 1129   09/06/20 0600  clindamycin (CLEOCIN) IVPB 900 mg        900 mg 100 mL/hr over 30 Minutes Intravenous On call to O.R. 09/06/20 0108 09/06/20 1135    .  He was given sequential compression devices, early ambulation, and plavix for DVT prophylaxis.  He benefited maximally from the hospital stay. He did have an episode of constipation requiring hospitalist consultation and CT scan of his abdomen. This resolved during his extended hospital stay.    Recent vital signs:  Vitals:   09/12/20 0742 09/12/20 1134  BP: (!) 142/64 (!) 146/56  Pulse: 66 78  Resp: 16 16  Temp: 98 F (36.7 C) 98.3 F (36.8 C)  SpO2: 96% 100%    Recent laboratory studies:  Lab Results  Component Value Date   HGB 10.2 (L) 09/12/2020   HGB 10.7 (L) 09/11/2020  HGB 11.0 (L) 09/08/2020   Lab Results  Component Value Date   WBC 7.9 09/12/2020   PLT 272 09/12/2020   Lab Results  Component Value Date   INR 0.9 09/04/2020   Lab Results  Component Value Date   NA 130 (L) 09/12/2020   K 4.1 09/12/2020   CL 95 (L) 09/12/2020   CO2 23 09/12/2020   BUN 24 (H) 09/12/2020   CREATININE 1.19 09/12/2020   GLUCOSE 314 (H) 09/12/2020    Discharge Medications:   Allergies as of 09/12/2020      Reactions   Ace Inhibitors Swelling   Face and lips   Penicillins Other (See Comments)   CHILDHOOD ALLERGY  Has patient had a PCN reaction causing immediate rash, facial/tongue/throat swelling, SOB or  lightheadedness with hypotension: Unknown Has patient had a PCN reaction causing severe rash involving mucus membranes or skin necrosis: Unknown Has patient had a PCN reaction that required hospitalization: No Has patient had a PCN reaction occurring within the last 10 years: No If all of the above answers are "NO", then may proceed with Cephalosporin use.   Aspirin Hives, Rash   Other Palpitations   MSG-headache      Medication List    TAKE these medications   acetaminophen 650 MG CR tablet Commonly known as: TYLENOL Take 650 mg by mouth every 8 (eight) hours as needed for pain.   amLODipine 10 MG tablet Commonly known as: NORVASC Take 10 mg by mouth every morning.   clopidogrel 75 MG tablet Commonly known as: Plavix Take 1 tablet (75 mg total) by mouth daily.   docusate sodium 100 MG capsule Commonly known as: COLACE Take 1 capsule (100 mg total) by mouth 2 (two) times daily.   ferrous sulfate 325 (65 FE) MG tablet Take 325 mg by mouth daily with breakfast.   glipiZIDE 10 MG 24 hr tablet Commonly known as: GLUCOTROL XL Take 10 mg by mouth daily with breakfast.   hydrochlorothiazide 25 MG tablet Commonly known as: HYDRODIURIL Take 25 mg by mouth daily.   HYDROcodone-acetaminophen 7.5-325 MG tablet Commonly known as: NORCO Take 1-2 tablets by mouth every 4 (four) hours as needed (pain).   KERASAL EX Apply 1 application topically daily as needed (toe fungus).   Melatonin 10 MG Tabs Take 10 mg by mouth at bedtime.   metFORMIN 1000 MG tablet Commonly known as: GLUCOPHAGE Take 1,000 mg by mouth 2 (two) times daily with a meal.   mometasone 50 MCG/ACT nasal spray Commonly known as: NASONEX Place 2 sprays into the nose daily.   multivitamin with minerals Tabs tablet Take 1 tablet by mouth daily. Centrum Silver   pantoprazole 20 MG tablet Commonly known as: Protonix Take 1 tablet (20 mg total) by mouth daily. What changed: when to take this   pravastatin 80  MG tablet Commonly known as: PRAVACHOL Take 80 mg by mouth at bedtime.   pyridOXINE 50 MG tablet Commonly known as: B-6 Take 50 mg by mouth daily.   sennosides-docusate sodium 8.6-50 MG tablet Commonly known as: SENOKOT-S Take 1 tablet by mouth every other day.   traMADol 50 MG tablet Commonly known as: ULTRAM Take 50 mg by mouth every 6 (six) hours as needed for moderate pain.   vitamin B-12 1000 MCG tablet Commonly known as: CYANOCOBALAMIN Take 1,000 mcg by mouth daily.            Durable Medical Equipment  (From admission, onward)  Start     Ordered   09/08/20 0835  For home use only DME 3 n 1  Once        09/08/20 0834   09/08/20 0835  For home use only DME Walker rolling  Once       Question Answer Comment  Walker: With 5 Inch Wheels   Patient needs a walker to treat with the following condition S/P total knee arthroplasty      09/08/20 0834          Diagnostic Studies: CT ABDOMEN PELVIS WO CONTRAST  Result Date: 09/11/2020 CLINICAL DATA:  Abdominal distension. Constipation worsening after recent knee replacement. EXAM: CT ABDOMEN AND PELVIS WITHOUT CONTRAST TECHNIQUE: Multidetector CT imaging of the abdomen and pelvis was performed following the standard protocol without IV contrast. COMPARISON:  CT 11/02/2018 FINDINGS: Lower chest: No acute airspace disease or pleural effusion. Heart size normal. Hepatobiliary: Unremarkable unenhanced appearance of the liver. Gallbladder physiologically distended, no calcified stone. No biliary dilatation. Pancreas: Mild fatty atrophy.  No ductal dilatation or inflammation. Spleen: Normal in size without focal abnormality. Adrenals/Urinary Tract: Normal adrenal glands. No hydronephrosis. No renal calculi. Mild symmetric bilateral perinephric edema. Subcentimeter hyperdense lesion in the upper right kidney likely hyperdense cyst, series 2, image 31, too small to characterize. Unremarkable urinary bladder. Stomach/Bowel: Small  hiatal hernia. Decompressed stomach. No small bowel obstruction, inflammation, wall thickening or evidence of small bowel ileus. Administered enteric contrast reaches the colon. Appendectomy. Mild gaseous distension of ascending, transverse, and proximal descending colon. No associated colonic wall thickening. Distal descending and sigmoid colonic diverticulosis without diverticulitis. Small volume of formed stool in the rectosigmoid colon. No abnormal rectal distention. No colonic inflammation. Vascular/Lymphatic: Aorto bi-iliac atherosclerosis. No aortic aneurysm. No enlarged lymph nodes in the abdomen or pelvis. Reproductive: Prominent prostate gland spans 5.4 cm transverse. Other: No ascites, free air, or focal fluid collection. No abdominal wall hernia. Previous bilateral inguinal hernias are no longer seen. Musculoskeletal: Again seen degenerative change in the lumbar spine. There are no acute or suspicious osseous abnormalities. Minimal subcutaneous soft tissue edema. IMPRESSION: 1. Mild gaseous distension of ascending, transverse, and proximal descending colon, without associated colonic wall thickening or inflammation. Administered enteric contrast reaches the colon. Findings may represent mild colonic ileus. 2. Distal colonic diverticulosis without diverticulitis. 3. Prominent prostate gland. Aortic Atherosclerosis (ICD10-I70.0). Electronically Signed   By: Keith Rake M.D.   On: 09/11/2020 18:19   DG Knee Right Port  Result Date: 09/06/2020 CLINICAL DATA:  Right knee arthroplasty EXAM: PORTABLE RIGHT KNEE - 1-2 VIEW COMPARISON:  None. FINDINGS: Postsurgical changes from right total knee arthroplasty. Arthroplasty components are in their expected alignment. No periprosthetic fracture. Nonspecific mineralized density projecting at the posterior joint line on lateral view, possibly intra-articular loose bodies. Expected postoperative changes within the overlying soft tissues. Advanced vascular  calcifications are present. IMPRESSION: 1. Status post right total knee arthroplasty without evidence of immediate postoperative complication. 2. Nonspecific mineralized density projecting at the posterior joint line on lateral view, possibly intra-articular loose bodies. Electronically Signed   By: Davina Poke D.O.   On: 09/06/2020 13:37   [place] OR NERVE BLOCK-IMAGE ONLY [facility])  Result Date: 09/06/2020 There is no interpretation for this exam.  This order is for images obtained during a surgical procedure.  Please See "Surgeries" Tab for more information regarding the procedure.    Disposition: Discharge disposition: 01-Home or Self Care            Signed: Elyn Aquas  Harlow Mares ,MD 09/12/2020, 12:06 PM

## 2020-09-12 NOTE — Progress Notes (Signed)
Patient had 3 large loose dark stools.

## 2020-09-13 NOTE — H&P (Signed)
Connor Hall MRN:  [id number] DOB/SEX:  08/15/1940/male  CHIEF COMPLAINT:  Painful right Knee  HISTORY: Patient is a 80 y.o. male presented with a history of pain in the right knee. Onset of symptoms was gradual starting several years ago with gradually worsening course since that time. Prior procedures on the knee include none. Patient has been treated conservatively with over-the-counter NSAIDs and activity modification. Patient currently rates pain in the knee at 10 out of 10 with activity. There is no pain at night.  PAST MEDICAL HISTORY: Patient Active Problem List   Diagnosis Date Noted  . S/P total knee arthroplasty, right 09/12/2020  . Constipation 09/11/2020  . Asthma 09/11/2020  . Hyponatremia 09/11/2020  . History of total knee arthroplasty, right 09/06/2020  . Aphasia 03/20/2020  . TIA (transient ischemic attack) 03/19/2020  . GERD (gastroesophageal reflux disease)   . Diabetes mellitus without complication ([place])   . Sleep apnea   . Coronary artery disease involving native coronary artery of native heart 12/08/2017  . Angina decubitus ([place]) 11/24/2017  . SOBOE (shortness of breath on exertion) 11/20/2017  . Pain of left hand 08/21/2017  . Carpal tunnel syndrome of left wrist 08/01/2017  . Iron deficiency anemia 10/11/2016  . FH: colon polyps 10/11/2016  . Primary osteoarthritis of left knee 10/09/2015  . Tear of meniscus of knee 07/26/2015  . Left knee pain 07/24/2015  . Subcoracoid bursitis of right shoulder 12/09/2014  . Rotator cuff tear, non-traumatic, right 07/07/2014  . Impingement syndrome, shoulder, right 03/23/2014  . Malignant neoplasm of skin of nose 02/07/2014  . Asthma without status asthmaticus 11/03/2013  . Diabetes mellitus type 2, uncomplicated ([place]) 75/64/3329  . Hyperlipidemia 11/03/2013  . Hypertension 11/03/2013  . Obesity 11/03/2013   Past Medical History:  Diagnosis Date  . Anemia   . Anxiety   . Arthritis   . Asthma    WELL  CONTROLLED, had as a child  . CAD (coronary artery disease)    MANAGING WITH MEDICINE-NO STENTS  . Cancer (HCC)    MELANOMA (NOSE, FACE AND HANDS), BASAL CELL  . Cervicalgia   . Depression   . Diabetes mellitus without complication ([place])   . Difficult intubation    damage to throat  . Diverticulitis   . Dyspnea   . GERD (gastroesophageal reflux disease)   . Headache   . Hernia of abdominal cavity   . History of 2019 novel coronavirus disease (COVID-19) 12/2019  . History of hiatal hernia   . Hyperlipidemia   . Hypertension   . PVD (peripheral vascular disease) ([place])   . Sleep apnea    NO CPAP-PT DENIES SLEEP APNEA BUT STUDY WAS DONE AND PT WAS TOLD HE HAD SLEEP APNEA  . TIA (transient ischemic attack) 03/19/2020   Past Surgical History:  Procedure Laterality Date  . APPENDECTOMY    . [place]    . CARPAL TUNNEL RELEASE Bilateral   . CATARACT EXTRACTION W/PHACO Right 04/03/2016   Procedure: CATARACT EXTRACTION PHACO AND INTRAOCULAR LENS PLACEMENT (IOC);  Surgeon: Estill Cotta, MD;  Location: [facility] ORS;  Service: Ophthalmology;  Laterality: Right;  [place] 1.37 AP% 25.8 CDE 44.67 Fluid pack lot # [id number]  . CATARACT EXTRACTION W/PHACO Left 05/29/2016   Procedure: CATARACT EXTRACTION PHACO AND INTRAOCULAR LENS PLACEMENT (IOC);  Surgeon: Estill Cotta, MD;  Location: [facility] ORS;  Service: Ophthalmology;  Laterality: Left;  [place] 01:16 AP% 23.3 CDE 31.18 Fluid pack  lot # [id number]  . COLONOSCOPY    . COLONOSCOPY WITH PROPOFOL N/A 01/15/2017   Procedure: COLONOSCOPY WITH PROPOFOL;  Surgeon: Manya Silvas, MD;  Location: [facility] ENDOSCOPY;  Service: Endoscopy;  Laterality: N/A;  . ESOPHAGOGASTRODUODENOSCOPY    . ESOPHAGOGASTRODUODENOSCOPY (EGD) WITH PROPOFOL N/A 01/15/2017   Procedure: ESOPHAGOGASTRODUODENOSCOPY (EGD) WITH PROPOFOL;  Surgeon: Manya Silvas, MD;  Location: [facility] ENDOSCOPY;  Service: Endoscopy;  Laterality:  N/A;  . INGUINAL HERNIA REPAIR Right 05/19/2019   Procedure: HERNIA REPAIR INGUINAL ADULT;  Surgeon: Robert Bellow, MD;  Location: [facility] ORS;  Service: General;  Laterality: Right;  . INGUINAL HERNIA REPAIR Left 06/21/2020   Procedure: HERNIA REPAIR INGUINAL ADULT;  Surgeon: Robert Bellow, MD;  Location: [facility] ORS;  Service: General;  Laterality: Left;  . KNEE ARTHROSCOPY Left 08/30/2015   Procedure: ARTHROSCOPY KNEE LEFT, partial medial menisectomy, chondral debridement;  Surgeon: Leanor Kail, MD;  Location: [facility] ORS;  Service: Orthopedics;  Laterality: Left;  . LEFT HEART CATH AND CORONARY ANGIOGRAPHY Left 11/24/2017   Procedure: LEFT HEART CATH AND CORONARY ANGIOGRAPHY;  Surgeon: Corey Skains, MD;  Location: [facility];  Service: Cardiovascular;  Laterality: Left;  . MOHS SURGERY    . POLYPECTOMY    . ROTATOR CUFF REPAIR Right   . TOTAL KNEE ARTHROPLASTY Right 09/06/2020   Procedure: TOTAL KNEE ARTHROPLASTY;  Surgeon: Lovell Sheehan, MD;  Location: [facility] ORS;  Service: Orthopedics;  Laterality: Right;  . TRIGGER FINGER RELEASE       MEDICATIONS:   No medications prior to admission.    ALLERGIES:   Allergies  Allergen Reactions  . Ace Inhibitors Swelling    Face and lips  . Penicillins Other (See Comments)    CHILDHOOD ALLERGY  Has patient had a PCN reaction causing immediate rash, facial/tongue/throat swelling, SOB or lightheadedness with hypotension: Unknown Has patient had a PCN reaction causing severe rash involving mucus membranes or skin necrosis: Unknown Has patient had a PCN reaction that required hospitalization: No Has patient had a PCN reaction occurring within the last 10 years: No If all of the above answers are "NO", then may proceed with Cephalosporin use.    . Aspirin Hives and Rash  . Other Palpitations    MSG-headache    REVIEW OF SYSTEMS:  Pertinent items noted in HPI and remainder of comprehensive ROS otherwise negative.   FAMILY  HISTORY:   Family History  Problem Relation Age of Onset  . Pancreatic cancer Mother   . Heart attack Father     SOCIAL HISTORY:   Social History   Tobacco Use  . Smoking status: Former Smoker    Packs/day: 0.50    Years: 13.00    Pack years: 6.50    Types: Cigarettes    Quit date: 08/28/1975    Years since quitting: 45.0  . Smokeless tobacco: Never Used  Substance Use Topics  . Alcohol use: Not Currently    Comment: used to drink daily and quit 15 years ago     EXAMINATION:  Vital signs in last 24 hours:    General appearance: alert, cooperative and no distress Throat: normal findings: N/A and N/A Lungs: clear to auscultation bilaterally Heart: regular rate and rhythm, S1, S2 normal, no murmur, click, rub or gallop Abdomen: soft, non-tender; bowel sounds normal; no masses,  no organomegaly Extremities: extremities normal, atraumatic, no cyanosis or edema and Homans sign is negative, no sign of DVT Pulses: 2+ and symmetric Skin: Skin color, texture, turgor  normal. No rashes or lesions Neurologic: Alert and oriented X 3, normal strength and tone. Normal symmetric reflexes. Normal coordination and gait  Musculoskeletal:  ROM 0-100, Ligaments intact,  Imaging Review Plain radiographs demonstrate severe degenerative joint disease of the right knee. The overall alignment is significant varus. The bone quality appears to be excellent for age and reported activity level.  Assessment/Plan: Primary osteoarthritis, right knee   The patient history, physical examination and imaging studies are consistent with advanced degenerative joint disease of the right knee. The patient has failed conservative treatment.  The clearance notes were reviewed.  After discussion with the patient it was felt that Total Knee Replacement was indicated. The procedure,  risks, and benefits of total knee arthroplasty were presented and reviewed. The risks including but not limited to aseptic loosening,  infection, blood clots, vascular injury, stiffness, patella tracking problems complications among others were discussed. The patient acknowledged the explanation, agreed to proceed with the plan.  Carlynn Spry 09/13/2020, 11:48 AM

## 2020-09-22 ENCOUNTER — Emergency Department
Admission: EM | Admit: 2020-09-22 | Discharge: 2020-09-22 | Disposition: A | Discharge status: Home / Self Care | Payer: Medicare HMO | Attending: Emergency Medicine | Admitting: Emergency Medicine

## 2020-09-22 ENCOUNTER — Encounter: Payer: Self-pay | Admitting: Emergency Medicine

## 2020-09-22 ENCOUNTER — Other Ambulatory Visit: Payer: Self-pay

## 2020-09-22 DIAGNOSIS — I2510 Atherosclerotic heart disease of native coronary artery without angina pectoris: Secondary | ICD-10-CM | POA: Diagnosis not present

## 2020-09-22 DIAGNOSIS — J45909 Unspecified asthma, uncomplicated: Secondary | ICD-10-CM | POA: Diagnosis not present

## 2020-09-22 DIAGNOSIS — Z96651 Presence of right artificial knee joint: Secondary | ICD-10-CM | POA: Insufficient documentation

## 2020-09-22 DIAGNOSIS — Z85828 Personal history of other malignant neoplasm of skin: Secondary | ICD-10-CM | POA: Insufficient documentation

## 2020-09-22 DIAGNOSIS — E119 Type 2 diabetes mellitus without complications: Secondary | ICD-10-CM | POA: Insufficient documentation

## 2020-09-22 DIAGNOSIS — Z7984 Long term (current) use of oral hypoglycemic drugs: Secondary | ICD-10-CM | POA: Diagnosis not present

## 2020-09-22 DIAGNOSIS — E860 Dehydration: Secondary | ICD-10-CM | POA: Insufficient documentation

## 2020-09-22 DIAGNOSIS — I10 Essential (primary) hypertension: Secondary | ICD-10-CM | POA: Insufficient documentation

## 2020-09-22 DIAGNOSIS — Z79899 Other long term (current) drug therapy: Secondary | ICD-10-CM | POA: Diagnosis not present

## 2020-09-22 DIAGNOSIS — Z8616 Personal history of COVID-19: Secondary | ICD-10-CM | POA: Insufficient documentation

## 2020-09-22 DIAGNOSIS — Z87891 Personal history of nicotine dependence: Secondary | ICD-10-CM | POA: Insufficient documentation

## 2020-09-22 LAB — CBC
HCT: 30.4 % — ABNORMAL LOW (ref 39.0–52.0)
Hemoglobin: 10.4 g/dL — ABNORMAL LOW (ref 13.0–17.0)
MCH: 29.9 pg (ref 26.0–34.0)
MCHC: 34.2 g/dL (ref 30.0–36.0)
MCV: 87.4 fL (ref 80.0–100.0)
Platelets: 646 10*3/uL — ABNORMAL HIGH (ref 150–400)
RBC: 3.48 MIL/uL — ABNORMAL LOW (ref 4.22–5.81)
RDW: 12.9 % (ref 11.5–15.5)
WBC: 9.3 10*3/uL (ref 4.0–10.5)
nRBC: 0 % (ref 0.0–0.2)

## 2020-09-22 LAB — BASIC METABOLIC PANEL
Anion gap: 16 — ABNORMAL HIGH (ref 5–15)
BUN: 27 mg/dL — ABNORMAL HIGH (ref 8–23)
CO2: 17 mmol/L — ABNORMAL LOW (ref 22–32)
Calcium: 9.3 mg/dL (ref 8.9–10.3)
Chloride: 93 mmol/L — ABNORMAL LOW (ref 98–111)
Creatinine, Ser: 1.25 mg/dL — ABNORMAL HIGH (ref 0.61–1.24)
GFR, Estimated: 59 mL/min — ABNORMAL LOW (ref >60)
Glucose, Bld: 255 mg/dL — ABNORMAL HIGH (ref 70–99)
Potassium: 3.7 mmol/L (ref 3.5–5.1)
Sodium: 126 mmol/L — ABNORMAL LOW (ref 135–145)

## 2020-09-22 MED ORDER — SODIUM CHLORIDE 0.9 % IV BOLUS
1000.0000 mL | Freq: Once | INTRAVENOUS | Status: AC
  Administered 2020-09-22: 1000 mL via INTRAVENOUS

## 2020-09-22 NOTE — Discharge Instructions (Addendum)
Please seek medical attention for any high fevers, chest pain, shortness of breath, change in behavior, persistent vomiting, bloody stool or any other new or concerning symptoms.  

## 2020-09-22 NOTE — ED Provider Notes (Signed)
[facility] Emergency Department Provider Note  ____________________________________________   I have reviewed the triage vital signs and the nursing notes.   HISTORY  Chief Complaint Dehydration  History limited by: Not Limited   HPI Connor Hall is a 80 y.o. male who presents to the emergency department today because of concern for dehydration. The patient states that he had knee surgery performed earlier this month. Since being put on the pain medication he has been having problems with constipation and decreased oral intake. Went to PCP office today where they performed work up which had blood findings concerning for dehydration. Patient states that an x-ray of his stomach was also obtained which did not show any acute abnormality.    Records reviewed. Per medical record review patient has a history of recent right knee surgery.  Past Medical History:  Diagnosis Date  . Anemia   . Anxiety   . Arthritis   . Asthma    WELL CONTROLLED, had as a child  . CAD (coronary artery disease)    MANAGING WITH MEDICINE-NO STENTS  . Cancer (HCC)    MELANOMA (NOSE, FACE AND HANDS), BASAL CELL  . Cervicalgia   . Depression   . Diabetes mellitus without complication ([place])   . Difficult intubation    damage to throat  . Diverticulitis   . Dyspnea   . GERD (gastroesophageal reflux disease)   . Headache   . Hernia of abdominal cavity   . History of 2019 novel coronavirus disease (COVID-19) 12/2019  . History of hiatal hernia   . Hyperlipidemia   . Hypertension   . PVD (peripheral vascular disease) ([place])   . Sleep apnea    NO CPAP-PT DENIES SLEEP APNEA BUT STUDY WAS DONE AND PT WAS TOLD HE HAD SLEEP APNEA  . TIA (transient ischemic attack) 03/19/2020    Patient Active Problem List   Diagnosis Date Noted  . S/P total knee arthroplasty, right 09/12/2020  . Constipation 09/11/2020  . Asthma 09/11/2020  . Hyponatremia 09/11/2020  . History of total knee  arthroplasty, right 09/06/2020  . Aphasia 03/20/2020  . TIA (transient ischemic attack) 03/19/2020  . GERD (gastroesophageal reflux disease)   . Diabetes mellitus without complication ([place])   . Sleep apnea   . Coronary artery disease involving native coronary artery of native heart 12/08/2017  . Angina decubitus ([place]) 11/24/2017  . SOBOE (shortness of breath on exertion) 11/20/2017  . Pain of left hand 08/21/2017  . Carpal tunnel syndrome of left wrist 08/01/2017  . Iron deficiency anemia 10/11/2016  . FH: colon polyps 10/11/2016  . Primary osteoarthritis of left knee 10/09/2015  . Tear of meniscus of knee 07/26/2015  . Left knee pain 07/24/2015  . Subcoracoid bursitis of right shoulder 12/09/2014  . Rotator cuff tear, non-traumatic, right 07/07/2014  . Impingement syndrome, shoulder, right 03/23/2014  . Malignant neoplasm of skin of nose 02/07/2014  . Asthma without status asthmaticus 11/03/2013  . Diabetes mellitus type 2, uncomplicated ([place]) 53/66/4403  . Hyperlipidemia 11/03/2013  . Hypertension 11/03/2013  . Obesity 11/03/2013    Past Surgical History:  Procedure Laterality Date  . APPENDECTOMY    . [place]    . CARPAL TUNNEL RELEASE Bilateral   . CATARACT EXTRACTION W/PHACO Right 04/03/2016   Procedure: CATARACT EXTRACTION PHACO AND INTRAOCULAR LENS PLACEMENT (IOC);  Surgeon: Estill Cotta, MD;  Location: [facility] ORS;  Service: Ophthalmology;  Laterality: Right;  [place] 1.37 AP% 25.8 CDE 44.67 Fluid pack lot # [id number]  . CATARACT EXTRACTION W/PHACO Left 05/29/2016   Procedure: CATARACT EXTRACTION PHACO AND INTRAOCULAR LENS PLACEMENT (IOC);  Surgeon: Estill Cotta, MD;  Location: [facility] ORS;  Service: Ophthalmology;  Laterality: Left;  [place] 01:16 AP% 23.3 CDE 31.18 Fluid pack lot # [id number]  . COLONOSCOPY    . COLONOSCOPY WITH PROPOFOL N/A 01/15/2017   Procedure: COLONOSCOPY WITH PROPOFOL;  Surgeon: Manya Silvas, MD;  Location[facility] ENDOSCOPY;  Service: Endoscopy;  Laterality: N/A;  . ESOPHAGOGASTRODUODENOSCOPY    . ESOPHAGOGASTRODUODENOSCOPY (EGD) WITH PROPOFOL N/A 01/15/2017   Procedure: ESOPHAGOGASTRODUODENOSCOPY (EGD) WITH PROPOFOL;  Surgeon: Manya Silvas, MD;  Location: [facility] ENDOSCOPY;  Service: Endoscopy;  Laterality: N/A;  . INGUINAL HERNIA REPAIR Right 05/19/2019   Procedure: HERNIA REPAIR INGUINAL ADULT;  Surgeon: Robert Bellow, MD;  Location: [facility] ORS;  Service: General;  Laterality: Right;  . INGUINAL HERNIA REPAIR Left 06/21/2020   Procedure: HERNIA REPAIR INGUINAL ADULT;  Surgeon: Robert Bellow, MD;  Location: [facility] ORS;  Service: General;  Laterality: Left;  . KNEE ARTHROSCOPY Left 08/30/2015   Procedure: ARTHROSCOPY KNEE LEFT, partial medial menisectomy, chondral debridement;  Surgeon: Leanor Kail, MD;  Location: [facility] ORS;  Service: Orthopedics;  Laterality: Left;  . LEFT HEART CATH AND CORONARY ANGIOGRAPHY Left 11/24/2017   Procedure: LEFT HEART CATH AND CORONARY ANGIOGRAPHY;  Surgeon: Corey Skains, MD;  Location: [facility] CV [facility];  Service: Cardiovascular;  Laterality: Left;  . MOHS SURGERY    . POLYPECTOMY    . ROTATOR CUFF REPAIR Right   . TOTAL KNEE ARTHROPLASTY Right 09/06/2020   Procedure: TOTAL KNEE ARTHROPLASTY;  Surgeon: Lovell Sheehan, MD;  Location: [facility] ORS;  Service: Orthopedics;  Laterality: Right;  . TRIGGER FINGER RELEASE      Prior to Admission medications   Medication Sig Start Date End Date Taking? Authorizing Provider  acetaminophen (TYLENOL) 650 MG CR tablet Take 650 mg by mouth every 8 (eight) hours as needed for pain. Patient not taking: Reported on 09/06/2020    [provider]  amLODipine (NORVASC) 10 MG tablet Take 10 mg by mouth every morning.     [provider]  clopidogrel (PLAVIX) 75 MG tablet Take 1 tablet (75 mg total) by mouth daily. 03/20/20 03/20/21  Wouk, Ailene Rud, MD  docusate sodium (COLACE) 100 MG  capsule Take 1 capsule (100 mg total) by mouth 2 (two) times daily. 09/08/20   Carlynn Spry, PA-C  ferrous sulfate 325 (65 FE) MG tablet Take 325 mg by mouth daily with breakfast.    [provider]  glipiZIDE (GLUCOTROL XL) 10 MG 24 hr tablet Take 10 mg by mouth daily with breakfast.    [provider]  hydrochlorothiazide (HYDRODIURIL) 25 MG tablet Take 25 mg by mouth daily.    [provider]  HYDROcodone-acetaminophen (NORCO) 7.5-325 MG tablet Take 1-2 tablets by mouth every 4 (four) hours as needed (pain). 09/08/20   Carlynn Spry, PA-C  Melatonin 10 MG TABS Take 10 mg by mouth at bedtime.    [provider]  metFORMIN (GLUCOPHAGE) 1000 MG tablet Take 1,000 mg by mouth 2 (two) times daily with a meal.    [provider]  mometasone (NASONEX) 50 MCG/ACT nasal spray Place 2 sprays into the nose daily.    [provider]  Multiple Vitamin (MULTIVITAMIN WITH MINERALS) TABS tablet Take 1 tablet by mouth daily. Centrum Silver    [provider]  pantoprazole (PROTONIX) 20 MG tablet Take 1 tablet (20 mg total) by mouth daily. Patient taking differently: Take 20 mg by mouth in the morning. 03/20/20 03/20/21  Wouk, Ailene Rud, MD  pravastatin (PRAVACHOL) 80 MG tablet Take 80 mg by mouth at bedtime.    [provider]  pyridOXINE (B-6) 50 MG tablet Take 50 mg by mouth daily.    [provider]  Salicylic Acid-Urea (KERASAL EX) Apply 1 application topically daily as needed (toe fungus).    [provider]  sennosides-docusate sodium (SENOKOT-S) 8.6-50 MG tablet Take 1 tablet by mouth every other day.    [provider]  traMADol (ULTRAM) 50 MG tablet Take 50 mg by mouth every 6 (six) hours as needed for moderate pain.     [provider]  vitamin B-12 (CYANOCOBALAMIN) 1000 MCG tablet Take 1,000 mcg by mouth daily.    [provider]    Allergies Ace inhibitors, Penicillins, Aspirin, and  Other  Family History  Problem Relation Age of Onset  . Pancreatic cancer Mother   . Heart attack Father     Social History Social History   Tobacco Use  . Smoking status: Former Smoker    Packs/day: 0.50    Years: 13.00    Pack years: 6.50    Types: Cigarettes    Quit date: 08/28/1975    Years since quitting: 45.1  . Smokeless tobacco: Never Used  Vaping Use  . Vaping Use: Never used  Substance Use Topics  . 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 Drug use: No    Review of Systems Constitutional: No fever/chills Eyes: No visual changes. ENT: No sore throat. Cardiovascular: Denies chest pain. Respiratory: Denies shortness of breath. Gastrointestinal: Positive for abdominal discomfort, constipation, decreased oral intake.  Genitourinary: Negative for dysuria. Musculoskeletal: Negative for back pain. Skin: Negative for rash. Neurological: Negative for headaches, focal weakness or numbness.  ____________________________________________   PHYSICAL EXAM:  VITAL SIGNS: ED Triage Vitals  Enc Vitals Group     BP 09/22/20 1735 138/76     Pulse Rate 09/22/20 1735 97     Resp 09/22/20 1735 20     Temp 09/22/20 1735 97.7 F (36.5 C)     Temp Source 09/22/20 1735 Oral     SpO2 09/22/20 1735 98 %     Weight 09/22/20 1734 204 lb (92.5 kg)     Height 09/22/20 1734 6' (1.829 m)     Head Circumference --      Peak Flow --      Pain Score 09/22/20 1734 4   Constitutional: Alert and oriented.  Eyes: Conjunctivae are normal.  ENT      Head: Normocephalic and atraumatic.      Nose: No congestion/rhinnorhea.      Mouth/Throat: Mucous membranes are moist.      Neck: No stridor. Hematological/Lymphatic/Immunilogical: No cervical lymphadenopathy. Cardiovascular: Normal rate, regular rhythm.  No murmurs, rubs, or gallops.  Respiratory: Normal respiratory effort without tachypnea nor retractions. Breath sounds are clear and equal  bilaterally. No wheezes/rales/rhonchi. Gastrointestinal: Soft and non tender. No rebound. No guarding.  Genitourinary: Deferred Musculoskeletal: Normal range of motion in all extremities. No lower extremity edema. Neurologic:  Normal speech and language. No gross focal neurologic deficits are appreciated.  Skin:  Skin is warm, dry and intact. No rash noted. Psychiatric: Mood and affect are normal. Speech and behavior are normal. Patient exhibits appropriate  insight and judgment.  ____________________________________________    LABS (pertinent positives/negatives)  CBC wbc 9.3, hgb 10.4, plt 646 BMP na 126, k 3.7, glu 255, cr 1.25  ____________________________________________   EKG  I, Nance Pear, attending physician, personally viewed and interpreted this EKG  EKG Time: 1741 Rate: 89 Rhythm: normal sinus rhythm Axis: left axis deviation Intervals: qtc 425 QRS: narrow ST changes: no st elevation Impression: normal ekg  ____________________________________________    RADIOLOGY  None  ____________________________________________   PROCEDURES  Procedures  ____________________________________________   INITIAL IMPRESSION / ASSESSMENT AND PLAN / ED COURSE  Pertinent labs & imaging results that were available during my care of the patient were reviewed by me and considered in my medical decision making (see chart for details).   Patient presented to the emergency department today because of concern for dehydration. Patient did have slight hyponatremia. Per chart review the patient's sodium appears to typically stay in the low 130s. Patient was given IVFs here in the emergency department. Did feel better afterwards. At this time will plan on discharging home. Discussed with patient importance of contacted physician for lab work recheck in a few days. Encouraged return for any worsening symptoms.    ____________________________________________   FINAL CLINICAL  IMPRESSION(S) / ED DIAGNOSES  Final diagnoses:  Dehydration     Note: This dictation was prepared with Dragon dictation. Any transcriptional errors that result from this process are unintentional     Nance Pear, MD 09/22/20 2328

## 2020-09-22 NOTE — ED Triage Notes (Signed)
Pt to ED via POV with c/o constipattion, pt states had total R knee on 5/4. Pt states went to [facility] today and came back for anemia and needed fluids. Pt states feels like he is going to pass out all the time. Pt states has had N/V and decreased PO intake since the surgery.

## 2020-09-22 NOTE — ED Notes (Signed)
Pt awake and alert; GCS 15.  Reports mild surgical pain to R knee from knee replacement 2 weeks ago and acute generalized abd pain rated 2/10 described as discomfort with cramping - reports associated n/v with decreased oral intake- last vomiting episode earlier today; pt reports having daily BMs but states he feels that he is not clearing his bowels-- abdomen round, soft, nontender at this time.  Pt now awaits ED provider eval - no immediate needs, questions, concerns at this time.  Will monitor for acute changes and maintain plan of care.

## 2020-09-28 ENCOUNTER — Emergency Department: Payer: Medicare HMO

## 2020-09-28 ENCOUNTER — Other Ambulatory Visit: Payer: Self-pay

## 2020-09-28 ENCOUNTER — Emergency Department
Admission: EM | Admit: 2020-09-28 | Discharge: 2020-09-28 | Disposition: A | Discharge status: Home / Self Care | Payer: Medicare HMO | Attending: Emergency Medicine | Admitting: Emergency Medicine

## 2020-09-28 DIAGNOSIS — Z7984 Long term (current) use of oral hypoglycemic drugs: Secondary | ICD-10-CM | POA: Insufficient documentation

## 2020-09-28 DIAGNOSIS — J45909 Unspecified asthma, uncomplicated: Secondary | ICD-10-CM | POA: Diagnosis not present

## 2020-09-28 DIAGNOSIS — Z8616 Personal history of COVID-19: Secondary | ICD-10-CM | POA: Insufficient documentation

## 2020-09-28 DIAGNOSIS — Z7902 Long term (current) use of antithrombotics/antiplatelets: Secondary | ICD-10-CM | POA: Diagnosis not present

## 2020-09-28 DIAGNOSIS — Z85828 Personal history of other malignant neoplasm of skin: Secondary | ICD-10-CM | POA: Diagnosis not present

## 2020-09-28 DIAGNOSIS — Z79899 Other long term (current) drug therapy: Secondary | ICD-10-CM | POA: Insufficient documentation

## 2020-09-28 DIAGNOSIS — K5730 Diverticulosis of large intestine without perforation or abscess without bleeding: Secondary | ICD-10-CM | POA: Insufficient documentation

## 2020-09-28 DIAGNOSIS — I2510 Atherosclerotic heart disease of native coronary artery without angina pectoris: Secondary | ICD-10-CM | POA: Insufficient documentation

## 2020-09-28 DIAGNOSIS — Z96651 Presence of right artificial knee joint: Secondary | ICD-10-CM | POA: Insufficient documentation

## 2020-09-28 DIAGNOSIS — E119 Type 2 diabetes mellitus without complications: Secondary | ICD-10-CM | POA: Insufficient documentation

## 2020-09-28 DIAGNOSIS — R531 Weakness: Secondary | ICD-10-CM | POA: Insufficient documentation

## 2020-09-28 DIAGNOSIS — Z87891 Personal history of nicotine dependence: Secondary | ICD-10-CM | POA: Insufficient documentation

## 2020-09-28 DIAGNOSIS — I10 Essential (primary) hypertension: Secondary | ICD-10-CM | POA: Diagnosis not present

## 2020-09-28 LAB — URINALYSIS, COMPLETE (UACMP) WITH MICROSCOPIC
Bacteria, UA: NONE SEEN
Bilirubin Urine: NEGATIVE
Glucose, UA: NEGATIVE mg/dL
Hgb urine dipstick: NEGATIVE
Ketones, ur: 5 mg/dL — AB
Leukocytes,Ua: NEGATIVE
Nitrite: NEGATIVE
Protein, ur: 30 mg/dL — AB
Specific Gravity, Urine: 1.014 (ref 1.005–1.030)
Squamous Epithelial / HPF: NONE SEEN (ref 0–5)
pH: 6 (ref 5.0–8.0)

## 2020-09-28 LAB — BASIC METABOLIC PANEL
Anion gap: 15 (ref 5–15)
BUN: 18 mg/dL (ref 8–23)
CO2: 19 mmol/L — ABNORMAL LOW (ref 22–32)
Calcium: 9.6 mg/dL (ref 8.9–10.3)
Chloride: 96 mmol/L — ABNORMAL LOW (ref 98–111)
Creatinine, Ser: 1.04 mg/dL (ref 0.61–1.24)
GFR, Estimated: >60 mL/min (ref >60)
Glucose, Bld: 197 mg/dL — ABNORMAL HIGH (ref 70–99)
Potassium: 3.7 mmol/L (ref 3.5–5.1)
Sodium: 130 mmol/L — ABNORMAL LOW (ref 135–145)

## 2020-09-28 LAB — MAGNESIUM: Magnesium: 1.4 mg/dL — ABNORMAL LOW (ref 1.7–2.4)

## 2020-09-28 LAB — CBC
HCT: 31.6 % — ABNORMAL LOW (ref 39.0–52.0)
Hemoglobin: 10.9 g/dL — ABNORMAL LOW (ref 13.0–17.0)
MCH: 30 pg (ref 26.0–34.0)
MCHC: 34.5 g/dL (ref 30.0–36.0)
MCV: 87.1 fL (ref 80.0–100.0)
Platelets: 572 10*3/uL — ABNORMAL HIGH (ref 150–400)
RBC: 3.63 MIL/uL — ABNORMAL LOW (ref 4.22–5.81)
RDW: 13 % (ref 11.5–15.5)
WBC: 7.3 10*3/uL (ref 4.0–10.5)
nRBC: 0 % (ref 0.0–0.2)

## 2020-09-28 LAB — HEPATIC FUNCTION PANEL
ALT: 17 U/L (ref 0–44)
AST: 22 U/L (ref 15–41)
Albumin: 3.8 g/dL (ref 3.5–5.0)
Alkaline Phosphatase: 94 U/L (ref 38–126)
Bilirubin, Direct: <0.1 mg/dL (ref 0.0–0.2)
Total Bilirubin: 1.1 mg/dL (ref 0.3–1.2)
Total Protein: 7.4 g/dL (ref 6.5–8.1)

## 2020-09-28 LAB — LIPASE, BLOOD: Lipase: 38 U/L (ref 11–51)

## 2020-09-28 MED ORDER — MAGNESIUM SULFATE 2 GM/50ML IV SOLN
2.0000 g | Freq: Once | INTRAVENOUS | Status: AC
  Administered 2020-09-28: 2 g via INTRAVENOUS
  Filled 2020-09-28: qty 50

## 2020-09-28 MED ORDER — SODIUM CHLORIDE 0.9 % IV BOLUS
1000.0000 mL | Freq: Once | INTRAVENOUS | Status: AC
  Administered 2020-09-28: 1000 mL via INTRAVENOUS

## 2020-09-28 MED ORDER — ONDANSETRON 4 MG PO TBDP: 4.0000 mg | ORAL_TABLET | Freq: Four times a day (QID) | ORAL | 0 refills | Status: AC | PRN

## 2020-09-28 MED ORDER — ONDANSETRON 4 MG PO TBDP
4.0000 mg | ORAL_TABLET | Freq: Once | ORAL | Status: AC
  Administered 2020-09-28: 4 mg via ORAL
  Filled 2020-09-28: qty 1

## 2020-09-28 MED ORDER — IOHEXOL 9 MG/ML PO SOLN
500.0000 mL | ORAL | Status: AC
  Administered 2020-09-28 (×2): 500 mL via ORAL
  Filled 2020-09-28 (×2): qty 500

## 2020-09-28 NOTE — ED Triage Notes (Signed)
Pt to ED for weakness for a few weeks since having knee surgery on 5/4. States he has lost 20 pounds because it hurts to eat.  Skin color WDL, alert and oriented, NAD noted Denies vomiting or diarrhea

## 2020-09-28 NOTE — ED Provider Notes (Signed)
[facility] Emergency Department Provider Note   ____________________________________________   Event Date/Time   First MD Initiated Contact with Patient 09/28/20 1639     (approximate)  I have reviewed the triage vital signs and the nursing notes.   HISTORY  Chief Complaint Weakness    HPI Connor Hall is a 80 y.o. male with a somewhat recent right knee replacement.  Also history of diverticulitis, diabetes anxiety depression hypertension hyperlipidemia  Presents today, reports ongoing issue now for several weeks since the time of leaving the hospital.  He reports that over the course of the month he is lost about 20 pounds of weight.  He has had no appetite, he does still eat and drink small amounts, feels fatigued has no appetite and generally feeling just tired.  Reports the right knee itself is healing quite well.  No chest pain no trouble breathing.  No shortness of breath.  Feels fatigued.  Reports he is taking a nausea medicine that was prescribed from [facility] not quite sure what it is  Was seen in the ER Friday of last week and given a bag of fluids and reports that he felt a little bit better briefly, and was to follow-up with his doctor on June 2.  He was not able to get a sooner appointment  Comes back in for same symptoms he reports that occasionally he will vomit he will have decreased appetite, bowel movement every few days.  No black or bloody stools.  Continues to feel just generally fatigued   Past Medical History:  Diagnosis Date  . Anemia   . Anxiety   . Arthritis   . Asthma    WELL CONTROLLED, had as a child  . CAD (coronary artery disease)    MANAGING WITH MEDICINE-NO STENTS  . Cancer (HCC)    MELANOMA (NOSE, FACE AND HANDS), BASAL CELL  . Cervicalgia   . Depression   . Diabetes mellitus without complication ([place])   . Difficult intubation    damage to throat  . Diverticulitis   . Dyspnea   . GERD (gastroesophageal  reflux disease)   . Headache   . Hernia of abdominal cavity   . History of 2019 novel coronavirus disease (COVID-19) 12/2019  . History of hiatal hernia   . Hyperlipidemia   . Hypertension   . PVD (peripheral vascular disease) ([place])   . Sleep apnea    NO CPAP-PT DENIES SLEEP APNEA BUT STUDY WAS DONE AND PT WAS TOLD HE HAD SLEEP APNEA  . TIA (transient ischemic attack) 03/19/2020    Patient Active Problem List   Diagnosis Date Noted  . S/P total knee arthroplasty, right 09/12/2020  . Constipation 09/11/2020  . Asthma 09/11/2020  . Hyponatremia 09/11/2020  . History of total knee arthroplasty, right 09/06/2020  . Aphasia 03/20/2020  . TIA (transient ischemic attack) 03/19/2020  . GERD (gastroesophageal reflux disease)   . Diabetes mellitus without complication ([place])   . Sleep apnea   . Coronary artery disease involving native coronary artery of native heart 12/08/2017  . Angina decubitus ([place]) 11/24/2017  . SOBOE (shortness of breath on exertion) 11/20/2017  . Pain of left hand 08/21/2017  . Carpal tunnel syndrome of left wrist 08/01/2017  . Iron deficiency anemia 10/11/2016  . FH: colon polyps 10/11/2016  . Primary osteoarthritis of left knee 10/09/2015  . Tear of meniscus of knee 07/26/2015  . Left knee pain 07/24/2015  . Subcoracoid bursitis of right shoulder 12/09/2014  .  Rotator cuff tear, non-traumatic, right 07/07/2014  . Impingement syndrome, shoulder, right 03/23/2014  . Malignant neoplasm of skin of nose 02/07/2014  . Asthma without status asthmaticus 11/03/2013  . Diabetes mellitus type 2, uncomplicated ([place]) 64/40/3474  . Hyperlipidemia 11/03/2013  . Hypertension 11/03/2013  . Obesity 11/03/2013    Past Surgical History:  Procedure Laterality Date  . APPENDECTOMY    . [place]    . CARPAL TUNNEL RELEASE Bilateral   . CATARACT EXTRACTION W/PHACO Right 04/03/2016   Procedure: CATARACT EXTRACTION PHACO  AND INTRAOCULAR LENS PLACEMENT (IOC);  Surgeon: Estill Cotta, MD;  Location: [facility] ORS;  Service: Ophthalmology;  Laterality: Right;  [place] 1.37 AP% 25.8 CDE 44.67 Fluid pack lot # [id number]  . CATARACT EXTRACTION W/PHACO Left 05/29/2016   Procedure: CATARACT EXTRACTION PHACO AND INTRAOCULAR LENS PLACEMENT (IOC);  Surgeon: Estill Cotta, MD;  Location: [facility] ORS;  Service: Ophthalmology;  Laterality: Left;  [place] 01:16 AP% 23.3 CDE 31.18 Fluid pack lot # [id number]  . COLONOSCOPY    . COLONOSCOPY WITH PROPOFOL N/A 01/15/2017   Procedure: COLONOSCOPY WITH PROPOFOL;  Surgeon: Manya Silvas, MD;  Location: [facility] ENDOSCOPY;  Service: Endoscopy;  Laterality: N/A;  . ESOPHAGOGASTRODUODENOSCOPY    . ESOPHAGOGASTRODUODENOSCOPY (EGD) WITH PROPOFOL N/A 01/15/2017   Procedure: ESOPHAGOGASTRODUODENOSCOPY (EGD) WITH PROPOFOL;  Surgeon: Manya Silvas, MD;  Location: [facility] ENDOSCOPY;  Service: Endoscopy;  Laterality: N/A;  . INGUINAL HERNIA REPAIR Right 05/19/2019   Procedure: HERNIA REPAIR INGUINAL ADULT;  Surgeon: Robert Bellow, MD;  Location: [facility] ORS;  Service: General;  Laterality: Right;  . INGUINAL HERNIA REPAIR Left 06/21/2020   Procedure: HERNIA REPAIR INGUINAL ADULT;  Surgeon: Robert Bellow, MD;  Location: [facility] ORS;  Service: General;  Laterality: Left;  . KNEE ARTHROSCOPY Left 08/30/2015   Procedure: ARTHROSCOPY KNEE LEFT, partial medial menisectomy, chondral debridement;  Surgeon: Leanor Kail, MD;  Location: [facility] ORS;  Service: Orthopedics;  Laterality: Left;  . LEFT HEART CATH AND CORONARY ANGIOGRAPHY Left 11/24/2017   Procedure: LEFT HEART CATH AND CORONARY ANGIOGRAPHY;  Surgeon: Corey Skains, MD;  Location: [place] CV LAB;  Service: Cardiovascular;  Laterality: Left;  . MOHS SURGERY    . POLYPECTOMY    . ROTATOR CUFF REPAIR Right   . TOTAL KNEE ARTHROPLASTY Right 09/06/2020   Procedure: TOTAL KNEE ARTHROPLASTY;  Surgeon: Lovell Sheehan, MD;  Location: [facility] ORS;   Service: Orthopedics;  Laterality: Right;  . TRIGGER FINGER RELEASE      Prior to Admission medications   Medication Sig Start Date End Date Taking? Authorizing Provider  ondansetron (ZOFRAN ODT) 4 MG disintegrating tablet Take 1 tablet (4 mg total) by mouth every 6 (six) hours as needed for nausea or vomiting. 09/28/20  Yes Delman Kitten, MD  acetaminophen (TYLENOL) 650 MG CR tablet Take 650 mg by mouth every 8 (eight) hours as needed for pain. Patient not taking: Reported on 09/06/2020    [provider]  amLODipine (NORVASC) 10 MG tablet Take 10 mg by mouth every morning.     [provider]  clopidogrel (PLAVIX) 75 MG tablet Take 1 tablet (75 mg total) by mouth daily. 03/20/20 03/20/21  Wouk, Ailene Rud, MD  docusate sodium (COLACE) 100 MG capsule Take 1 capsule (100 mg total) by mouth 2 (two) times daily. 09/08/20   Carlynn Spry, PA-C  ferrous sulfate 325 (65 FE) MG tablet Take 325 mg by mouth daily with breakfast.  [provider]  glipiZIDE (GLUCOTROL XL) 10 MG 24 hr tablet Take 10 mg by mouth daily with breakfast.    [provider]  hydrochlorothiazide (HYDRODIURIL) 25 MG tablet Take 25 mg by mouth daily.    [provider]  HYDROcodone-acetaminophen (NORCO) 7.5-325 MG tablet Take 1-2 tablets by mouth every 4 (four) hours as needed (pain). 09/08/20   Carlynn Spry, PA-C  Melatonin 10 MG TABS Take 10 mg by mouth at bedtime.    [provider]  metFORMIN (GLUCOPHAGE) 1000 MG tablet Take 1,000 mg by mouth 2 (two) times daily with a meal.    [provider]  mometasone (NASONEX) 50 MCG/ACT nasal spray Place 2 sprays into the nose daily.    [provider]  Multiple Vitamin (MULTIVITAMIN WITH MINERALS) TABS tablet Take 1 tablet by mouth daily. Centrum Silver    [provider]  pantoprazole (PROTONIX) 20 MG tablet Take 1 tablet (20 mg total) by mouth daily. Patient taking differently: Take 20 mg by mouth in the  morning. 03/20/20 03/20/21  Wouk, Ailene Rud, MD  pravastatin (PRAVACHOL) 80 MG tablet Take 80 mg by mouth at bedtime.    [provider]  pyridOXINE (B-6) 50 MG tablet Take 50 mg by mouth daily.    [provider]  Salicylic Acid-Urea (KERASAL EX) Apply 1 application topically daily as needed (toe fungus).    [provider]  sennosides-docusate sodium (SENOKOT-S) 8.6-50 MG tablet Take 1 tablet by mouth every other day.    [provider]  traMADol (ULTRAM) 50 MG tablet Take 50 mg by mouth every 6 (six) hours as needed for moderate pain.     [provider]  vitamin B-12 (CYANOCOBALAMIN) 1000 MCG tablet Take 1,000 mcg by mouth daily.    [provider]    Allergies Ace inhibitors, Penicillins, Aspirin, and Other  Family History  Problem Relation Age of Onset  . Pancreatic cancer Mother   . Heart attack Father     Social History Social History   Tobacco Use  . Smoking status: Former Smoker    Packs/day: 0.50    Years: 13.00    Pack years: 6.50    Types: Cigarettes    Quit date: 08/28/1975    Years since quitting: 45.1  . Smokeless tobacco: Never Used  Vaping Use  . Vaping Use: Never used  Substance Use Topics  . 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 Drug use: No    Review of Systems Constitutional: No fever/chills Eyes: No visual changes. ENT: No sore throat. Cardiovascular: Denies chest pain. Respiratory: Denies shortness of breath. Gastrointestinal: Fatigue, nausea, very little appetite for several days now Genitourinary: Negative for dysuria. Musculoskeletal: Negative for back pain.  Right knee healing well Skin: Negative for rash. Neurological: Negative for headaches, areas of focal weakness or numbness.    ____________________________________________   PHYSICAL EXAM:  VITAL SIGNS: ED Triage Vitals  Enc Vitals Group     BP 09/28/20 1415 135/76     Pulse  Rate 09/28/20 1415 94     Resp 09/28/20 1415 18     Temp 09/28/20 1415 98.2 F (36.8 C)     Temp Source 09/28/20 1415 Oral     SpO2 09/28/20 1415 98 %     Weight 09/28/20 1424 202 lb 13.2 oz (92 kg)     Height 09/28/20 1424 6' (1.829 m)     Head  Circumference --      Peak Flow --      Pain Score 09/28/20 1424 0     Pain Loc --      Pain Edu? --      Excl. in [place]? --     Constitutional: Alert and oriented. Well appearing and in no acute distress. Eyes: Conjunctivae are normal. Head: Atraumatic. Nose: No congestion/rhinnorhea. Mouth/Throat: Mucous membranes are slightly dry. Neck: No stridor.  Cardiovascular: Normal rate, regular rhythm. Grossly normal heart sounds.  Good peripheral circulation. Respiratory: Normal respiratory effort.  No retractions. Lungs CTAB. Gastrointestinal: Soft and nontender. No distention. Musculoskeletal: No lower extremity tenderness nor edema.  Right knee wound clean dry intact.  No surrounding erythema or warmth.  Appears to be healing well. Neurologic:  Normal speech and language. No gross focal neurologic deficits are appreciated.  Skin:  Skin is warm, dry and intact. No rash noted. Psychiatric: Mood and affect are normal. Speech and behavior are normal.  ____________________________________________   LABS (all labs ordered are listed, but only abnormal results are displayed)  Labs Reviewed  BASIC METABOLIC PANEL - Abnormal; Notable for the following components:      Result Value   Sodium 130 (*)    Chloride 96 (*)    CO2 19 (*)    Glucose, Bld 197 (*)    All other components within normal limits  CBC - Abnormal; Notable for the following components:   RBC 3.63 (*)    Hemoglobin 10.9 (*)    HCT 31.6 (*)    Platelets 572 (*)    All other components within normal limits  URINALYSIS, COMPLETE (UACMP) WITH MICROSCOPIC - Abnormal; Notable for the following components:   Color, Urine YELLOW (*)    APPearance HAZY (*)    Ketones, ur 5 (*)     Protein, ur 30 (*)    All other components within normal limits  MAGNESIUM - Abnormal; Notable for the following components:   Magnesium 1.4 (*)    All other components within normal limits  HEPATIC FUNCTION PANEL  LIPASE, BLOOD   ____________________________________________  EKG  ED ECG REPORT I, Delman Kitten, the attending physician, personally viewed and interpreted this ECG.  Date: 09/28/2020 EKG Time: 1430 Rate: 90 Rhythm: normal sinus rhythm QRS Axis: normal Intervals: normal ST/T Wave abnormalities: normal Narrative Interpretation: no evidence of acute ischemia  ____________________________________________  RADIOLOGY  CT ABDOMEN PELVIS WO CONTRAST  Result Date: 09/28/2020 CLINICAL DATA:  80 year old male with nausea vomiting. Weight loss. EXAM: CT ABDOMEN AND PELVIS WITHOUT CONTRAST TECHNIQUE: Multidetector CT imaging of the abdomen and pelvis was performed following the standard protocol without IV contrast. COMPARISON:  CT abdomen pelvis dated 09/11/2020. FINDINGS: Evaluation of this exam is limited in the absence of intravenous contrast. Lower chest: The visualized lung bases are clear. There is coronary vascular calcification. No intra-abdominal free air or free fluid. Hepatobiliary: The liver is unremarkable. No intrahepatic biliary dilatation. The gallbladder is unremarkable. Pancreas: Unremarkable. No pancreatic ductal dilatation or surrounding inflammatory changes. Spleen: Normal in size without focal abnormality. Adrenals/Urinary Tract: The adrenal glands are unremarkable. There is no hydronephrosis or nephrolithiasis on either side. Several small bilateral renal lesions not characterized on this CT. Ultrasound may provide better evaluation. The visualized ureters and urinary bladder appear unremarkable. Stomach/Bowel: There is sigmoid diverticulosis with muscular hypertrophy. No active inflammatory changes. There is no bowel obstruction. Thickened appearance of the  proximal colon as well as distal colon, likely related to underdistention. No pericolonic  stranding. Appendectomy. Vascular/Lymphatic: Moderate aortoiliac atherosclerotic disease. The IVC is unremarkable. No portal venous gas. There is no adenopathy. Reproductive: Mildly enlarged prostate gland measuring 5.5 cm in transverse axial diameter. The seminal vesicles are symmetric. Other: None Musculoskeletal: Osteopenia with degenerative changes of the spine. No acute osseous pathology. IMPRESSION: 1. No acute intra-abdominal or pelvic pathology. 2. Sigmoid diverticulosis. No bowel obstruction. 3. Aortic Atherosclerosis (ICD10-I70.0). Electronically Signed   By: Anner Crete M.D.   On: 09/28/2020 19:13    CT imaging reviewed negative for acute intra-abdominal pathology.  Diverticulosis is noted.  No bowel obstruction. ____________________________________________   PROCEDURES  Procedure(s) performed: None  Procedures  Critical Care performed: No  ____________________________________________   INITIAL IMPRESSION / ASSESSMENT AND PLAN / ED COURSE  Pertinent labs & imaging results that were available during my care of the patient were reviewed by me and considered in my medical decision making (see chart for details).   Patient presents for ongoing fatigue decreased appetite and weight loss.  Recent knee surgery, thereafter he had severe constipation that seems that he had difficulty recovering thereafter.  He said decreased appetite poor intake, and fatigue.  He was evaluated 6 days ago in the ER received fluids and was discharged.  Comes back again reporting same ongoing symptoms of fatigue and decreased appetite.  He is using protein shakes, eating some but not well.  Hemodynamically stable without evidence of distress.  Normal mental status.  Reassuring clinical exam, but given his ongoing abdominal symptoms of nausea occasional vomiting repeat CT performed to evaluate exclude etiology such as  ileus obstruction, diverticulitis, intra-abdominal infection etc.  Repeat imaging here is very reassuring.  Clinical Course as of 09/28/20 2000  Thu Sep 28, 2020  1753 Sodium today is improved from previous ED visit.  He does however seem to be mildly dehydrated, and magnesium is low.  We will replenish.  Await CT scan to evaluate and exclude acute intra-abdominal pathology and/or ileus. [MQ]  1922 Hemoglobin(!): 10.9 Stable [MQ]  1922 Sodium(!): 130 Improving [MQ]  1922 CO2(!): 19 Improving [MQ]  1922 Calcium: 9.6 [MQ]  1922 Creatinine: 1.04 Stable [MQ]  1931 Patient resting in no distress.  Tolerated contrast well.  CT without acute findings.  Resting comfortably.  Discussed plan of care, will provide Zofran prescription, patient to follow-up closely with primary care has appointment already scheduled for Tuesday. [MQ]    Clinical Course User Index [MQ] Delman Kitten, MD   ----------------------------------------- 7:58 PM on 09/28/2020 ----------------------------------------- Patient resting comfortably.  Comfortable with plan for discharge.  We will add ondansetron to help with nausea as he is experiencing.  I do not see indication for admission to the hospital at this time, but close follow-up is necessitated.  He has an upcoming appointment next week with his primary doc, I have also messaged the primary doctor to make aware of need for close follow-up.  Message sent to Dr. Edwina Barth requesting close follow-up.  Return precautions and treatment recommendations and follow-up discussed with the patient who is agreeable with the plan.  ____________________________________________   FINAL CLINICAL IMPRESSION(S) / ED DIAGNOSES  Final diagnoses:  Generalized weakness  Hypomagnesemia        Note:  This document was prepared using Dragon voice recognition software and may include unintentional dictation errors       Delman Kitten, MD 09/28/20 2000

## 2021-01-13 IMAGING — DX DG CHEST 1V PORT
1 series · 1 of 1 positions shown · non-contrast
Comparison: None.

CLINICAL DATA: Acute onset expressive aphasia 1 hour ago dizziness.

EXAM:
PORTABLE CHEST 1 VIEW

[chest ap]
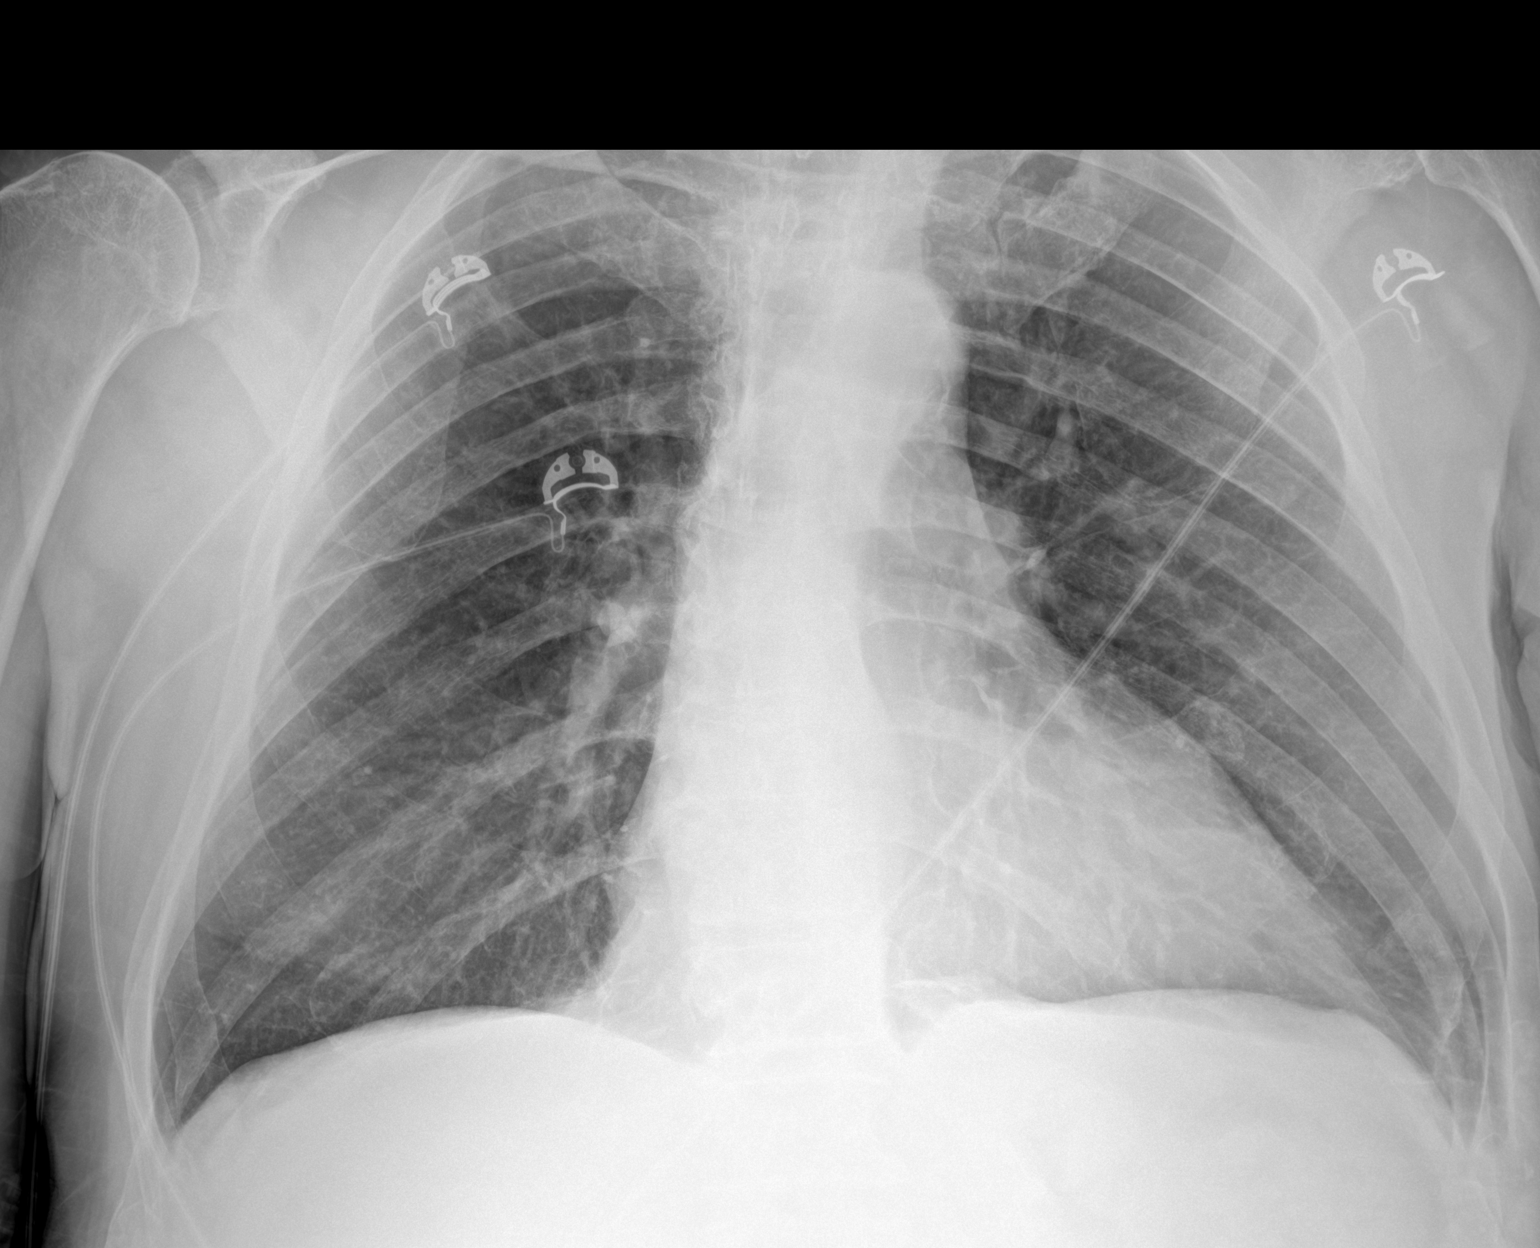

[1 of 1 positions shown; findings below may reference images not displayed]

FINDINGS: The heart size and mediastinal contours are within normal limits.
Aortic atherosclerotic calcification noted. Both lungs are clear.
Pulmonary hyperinflation is seen, suspicious for COPD.
IMPRESSION: Probable COPD.  No active cardiopulmonary disease.

## 2021-01-13 IMAGING — MR MR HEAD W/O CM
10 of 12 series · 35 of 48 positions shown · non-contrast
Comparison: Head CT 03/19/2020 and MRI 02/06/2009

CLINICAL DATA: TIA. Lightheadedness and transient speech
disturbance.

EXAM:
MRI HEAD WITHOUT CONTRAST
MRA HEAD WITHOUT CONTRAST
TECHNIQUE: Multiplanar, multiecho pulse sequences of the brain and surrounding
structures were obtained without intravenous contrast. Angiographic
images of the head were obtained using MRA technique without
contrast.

[Series 5: ax dwi_tracew · axial · 3.0mm · 0.71mm/px · z∈[-80,+85]mm · 4 of 56 slices shown]
[im 1/56]
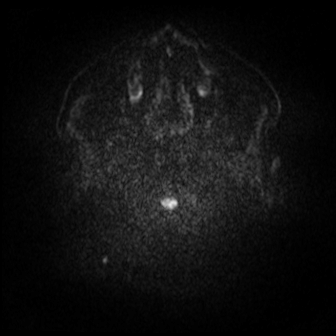
[im 19/56]
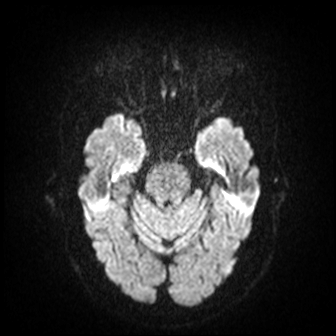
[im 37/56]
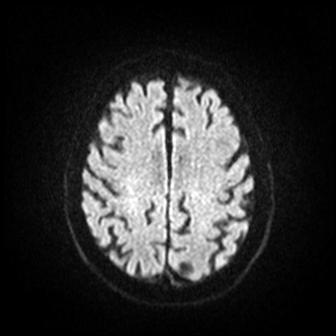
[im 56/56]
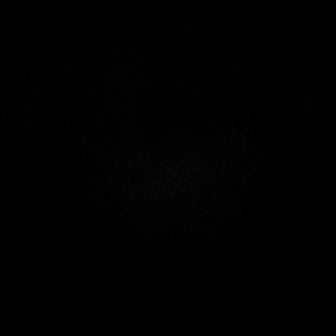

[Series 6: ax dwi_adc · axial · 3.0mm · 0.71mm/px · z∈[-80,+82]mm · 4 of 55 slices shown]
[im 1/55]
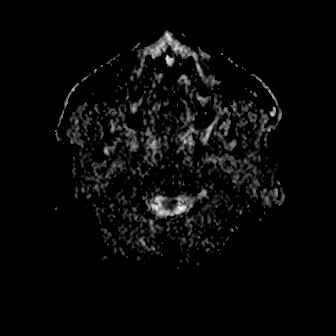
[im 19/55]
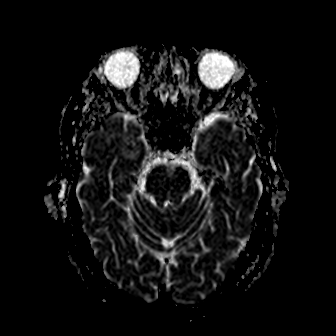
[im 37/55]
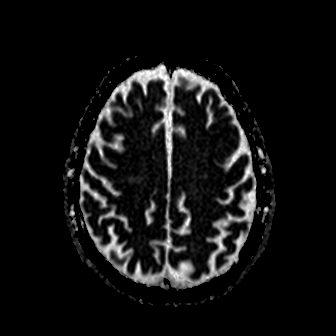
[im 55/55]
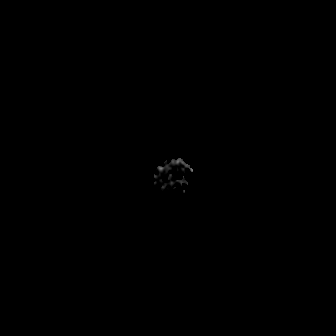

[Series 7: cor dwi_tracew · coronal · 5.0mm · 0.68mm/px · 2 of 40 slices shown]
[im 1/40]
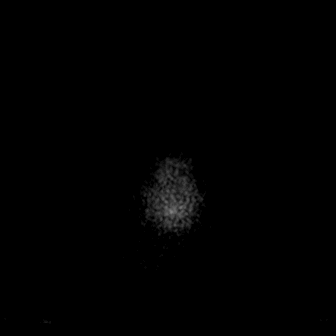
[im 40/40]
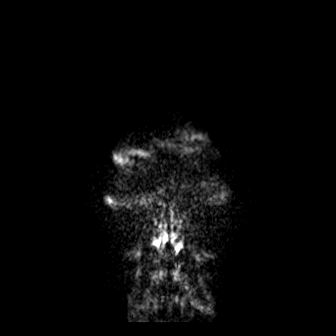

[Series 8: cor dwi_adc · coronal · 5.0mm · 0.68mm/px · 2 of 40 slices shown]
[im 1/40]
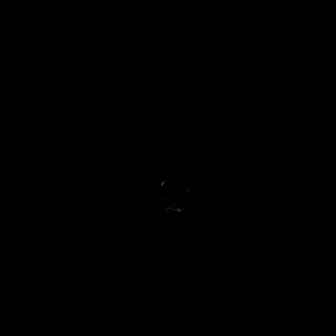
[im 40/40]
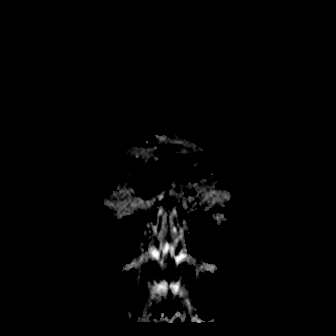

[Series 9: TOF · axial · 0.5mm · 0.41mm/px · z∈[-71,+11]mm · 7 of 205 slices shown]
[im 1/205]
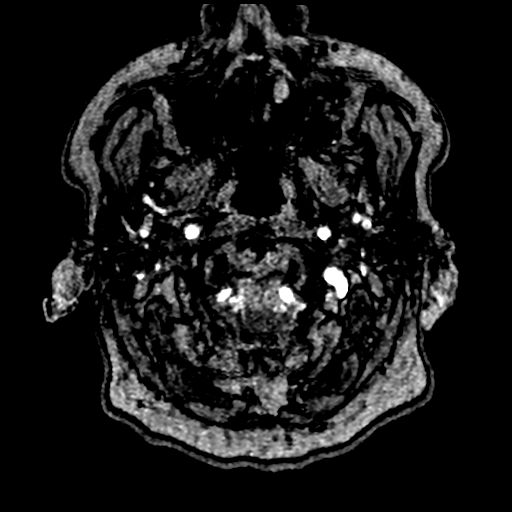
[im 38/205]
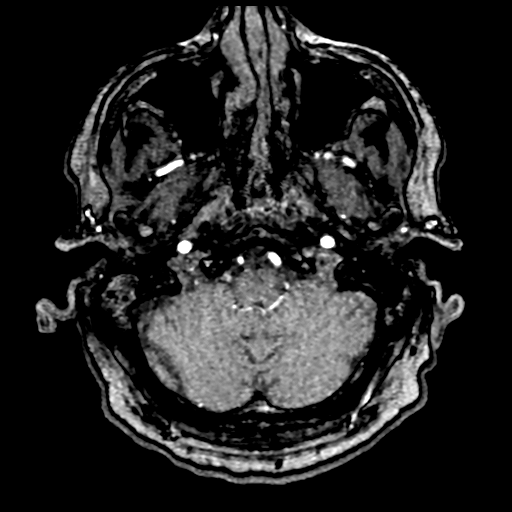
[im 56/205]
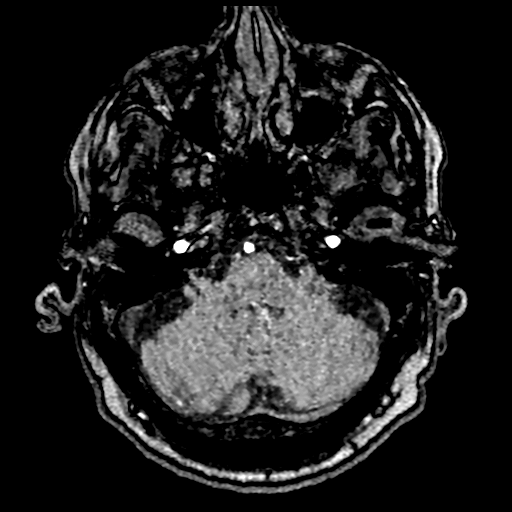
[im 93/205]
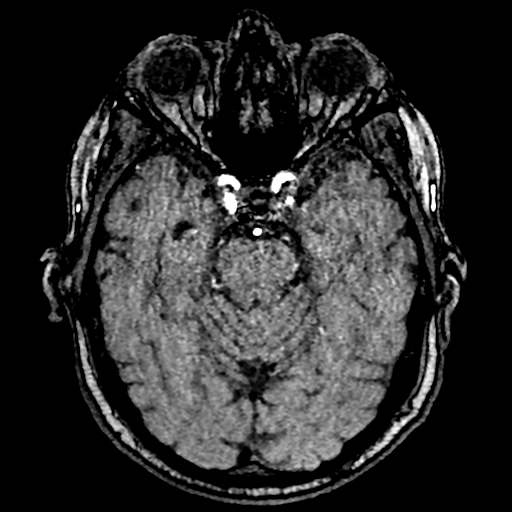
[im 112/205]
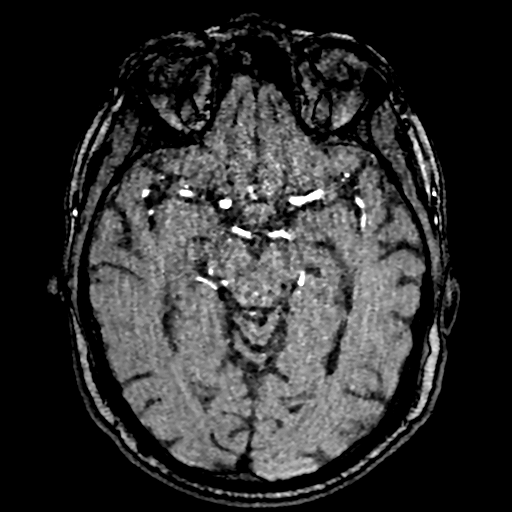
[im 149/205]
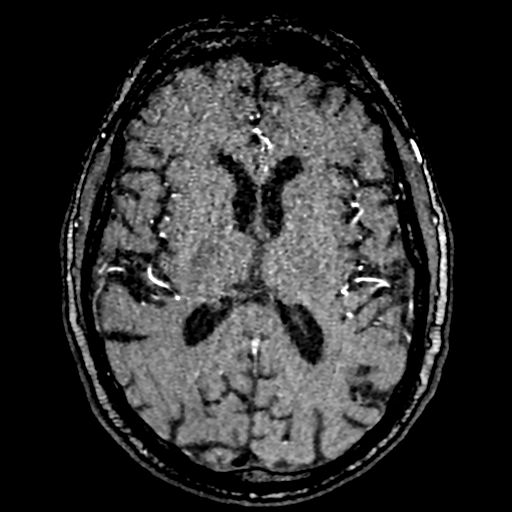
[im 167/205]
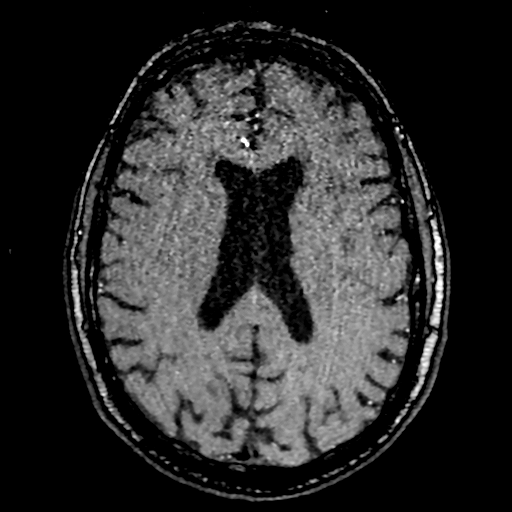

[Series 14: T1 · sagittal · 5.0mm · 0.94mm/px · 1 of 25 slices shown (1 of 2)]
[im 1/25]
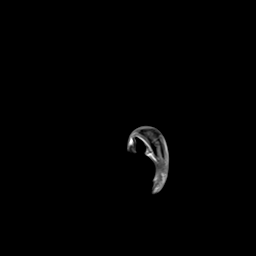

[Series 15: T2 · axial · 5.0mm · 0.53mm/px · z∈[-75,+81]mm · 2 of 27 slices shown (1 of 2)]
[im 1/27]
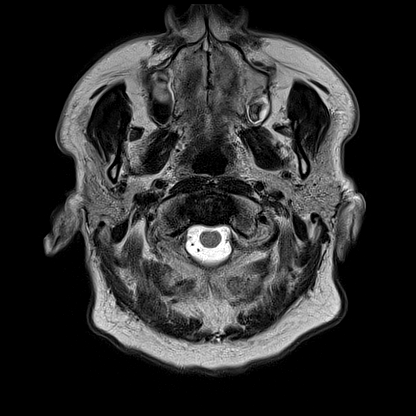
[im 27/27]
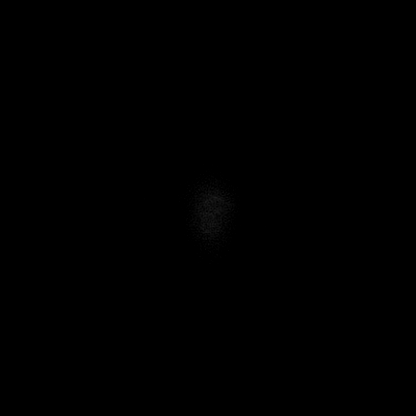

[Series 20: FLAIR · axial · 3.0mm · 0.69mm/px · z∈[-78,+84]mm · 3 of 55 slices shown]
[im 1/55]
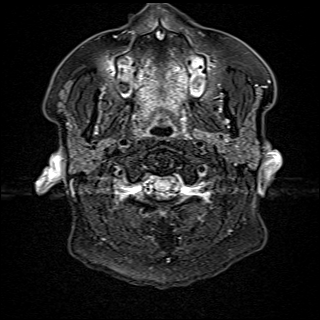
[im 28/55]
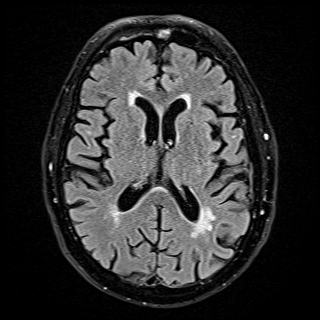
[im 55/55]
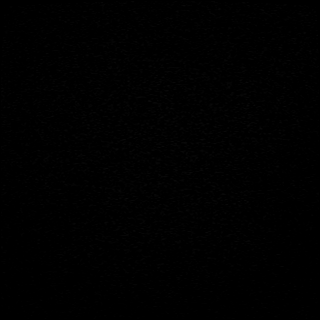

[Series 21: T1 · axial · 1.0mm · 0.98mm/px · z∈[-83,+91]mm · 8 of 175 slices shown (2 of 2)]
[im 1/175]
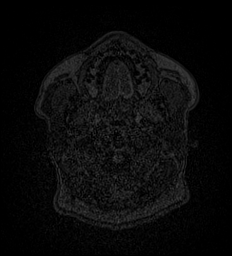
[im 20/175]
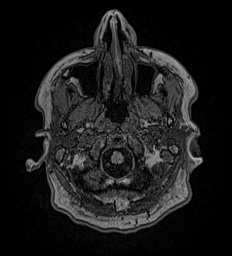
[im 59/175]
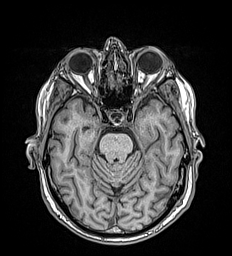
[im 78/175]
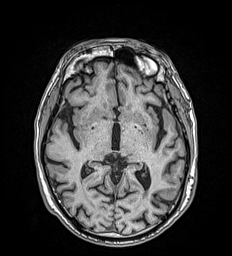
[im 97/175]
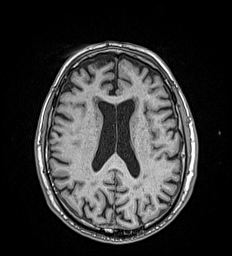
[im 117/175]
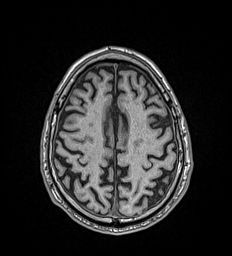
[im 155/175]
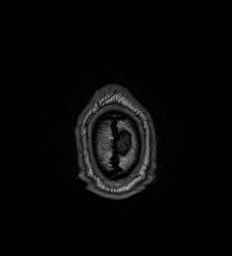
[im 175/175]
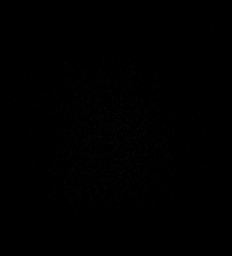

[Series 22: T2 · coronal · 5.0mm · 0.57mm/px · 2 of 29 slices shown (2 of 2)]
[im 1/29]
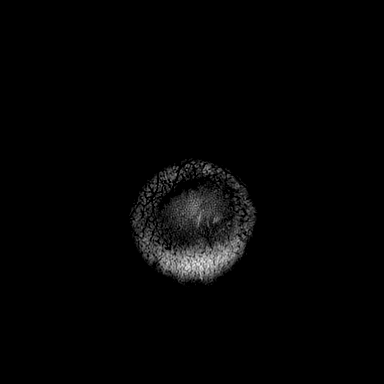
[im 29/29]
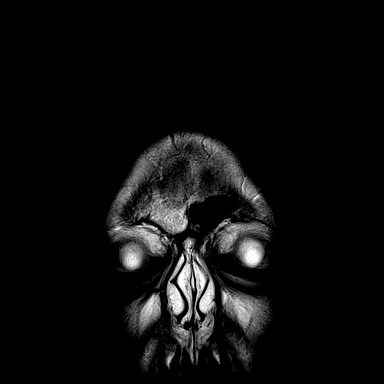

[35 of 48 positions shown; findings below may reference images not displayed]

FINDINGS: MRI HEAD FINDINGS

Brain: No acute infarct, mass, midline shift, or extra-axial fluid
collection is identified. There is a single focus of chronic
microhemorrhage in the posterior left frontal lobe. Patchy T2
hyperintensities in the cerebral white matter bilaterally are
nonspecific but compatible with mild-to-moderate chronic small
vessel ischemic disease and have progressed from the prior MRI. Mild
cerebral atrophy is within normal limits for age. A nonenlarged
partially empty sella is unchanged from the prior MRI.

Vascular: Major intracranial vascular flow voids are preserved.

Skull and upper cervical spine: Unremarkable bone marrow signal.

Sinuses/Orbits: Bilateral cataract extraction. Paranasal sinuses and
mastoid air cells are clear.

Other: None.

MRA HEAD FINDINGS

The visualized distal vertebral arteries are widely patent to the
basilar with the left being moderately dominant. Patent left PICA,
bilateral AICA, and bilateral SCA origins are visualized. The
basilar artery is widely patent. The PCAs are patent with asymmetric
attenuation and irregularity of distal right PCA branch vessels but
no evidence of a flow limiting proximal stenosis.

The internal carotid arteries are widely patent from skull base to
carotid termini. ACAs and MCAs are patent with distal branch vessel
irregularity but no evidence of a proximal branch occlusion or flow
limiting proximal stenosis. No aneurysm is identified.
IMPRESSION: 1. No acute intracranial abnormality.
2. Mild-to-moderate chronic small vessel ischemic disease,
progressed from 9141.
3. Intracranial atherosclerosis without evidence of a large or
medium vessel occlusion or flow limiting proximal stenosis.

## 2021-05-10 ENCOUNTER — Ambulatory Visit: Payer: Medicare HMO | Admitting: Anesthesiology

## 2021-05-10 ENCOUNTER — Encounter: Payer: Self-pay | Admitting: *Deleted

## 2021-05-10 NOTE — Anesthesia Preprocedure Evaluation (Addendum)
Anesthesia Evaluation  Patient identified by MRN, date of birth, ID band Patient awake    Reviewed: Allergy & Precautions, Patient's Chart, lab work & pertinent test results  History of Anesthesia Complications (+) DIFFICULT AIRWAY  Airway Mallampati: II  TM Distance: >3 FB Neck ROM: Full    Dental no notable dental hx.    Pulmonary shortness of breath, asthma , sleep apnea , former smoker,    Pulmonary exam normal breath sounds clear to auscultation       Cardiovascular hypertension, + angina + CAD and + Peripheral Vascular Disease  Normal cardiovascular exam Rhythm:Regular Rate:Normal  EKG  2021  NSR  LAD  INCOM RBBB  1 DEG AVB   CARDIAC CATH 7/19 ? Mid LM lesion is 40% stenosed. ? Mid LM to [facility] LAD lesion is 40% stenosed with 40% stenosed side branch in Ost Cx. ? Prox LAD lesion is 35% stenosed. ? Ost Cx to Prox Cx lesion is 25% stenosed.   Assessment The patient has had progressive canadian class 3 anginal symptoms with a high probability stress test with inferior sichemia with risk factors including diabetes, high blood pressure and high cholesterol.  normal left ventricular function with ejection fraction of 60%  mild 2 vessel coronary artery disease   [place] to Lad with 40% stenosis    Neuro/Psych  Headaches, Anxiety Depression TIA (11/22) Neuromuscular disease (PARKINSON'S DZ?)    GI/Hepatic hiatal hernia, GERD  ,  Endo/Other  diabetes  Renal/GU      Musculoskeletal  (+) Arthritis ,   Abdominal   Peds  Hematology  (+) Blood dyscrasia (PLAVIX LAST TAKEN 05/08/21 IN AM), anemia ,   Anesthesia Other Findings   Reproductive/Obstetrics                          Anesthesia Physical Anesthesia Plan  ASA: 3  Anesthesia Plan: General   Post-op Pain Management:    Induction: Intravenous  PONV Risk Score and Plan:   Airway Management Planned: Natural Airway and Nasal  Cannula  Additional Equipment:   Intra-op Plan:   Post-operative Plan:   Informed Consent: I have reviewed the patients History and Physical, chart, labs and discussed the procedure including the risks, benefits and alternatives for the proposed anesthesia with the patient or authorized representative who has indicated his/her understanding and acceptance.     Dental Advisory Given  Plan Discussed with: Anesthesiologist, CRNA and Surgeon  Anesthesia Plan Comments: (Patient consented for risks of anesthesia including but not limited to:  - adverse reactions to medications - risk of airway placement if required - damage to eyes, teeth, lips or other oral mucosa - nerve damage due to positioning  - sore throat or hoarseness - Damage to heart, brain, nerves, lungs, other parts of body or loss of life  Patient voiced understanding.)        Anesthesia Quick Evaluation

## 2021-05-11 ENCOUNTER — Encounter: Payer: Self-pay | Admitting: *Deleted

## 2021-05-11 ENCOUNTER — Ambulatory Visit
Admission: RE | Admit: 2021-05-11 | Discharge: 2021-05-11 | Disposition: A | Discharge status: Home / Self Care | Payer: Medicare HMO | Attending: Gastroenterology | Admitting: Gastroenterology

## 2021-05-11 ENCOUNTER — Encounter
Admission: RE | Disposition: A | Discharge status: Home / Self Care | Payer: Self-pay | Source: Home / Self Care | Attending: Gastroenterology

## 2021-05-11 ENCOUNTER — Other Ambulatory Visit: Payer: Self-pay

## 2021-05-11 DIAGNOSIS — Z539 Procedure and treatment not carried out, unspecified reason: Secondary | ICD-10-CM | POA: Insufficient documentation

## 2021-05-11 HISTORY — DX: Cortical age-related cataract, unspecified eye: H25.019

## 2021-05-11 HISTORY — DX: Cerebral infarction, unspecified: I63.9

## 2021-05-11 HISTORY — DX: Unspecified abdominal hernia without obstruction or gangrene: K46.9

## 2021-05-11 HISTORY — DX: Polyp of colon: K63.5

## 2021-05-11 HISTORY — DX: Allergy status to unspecified drugs, medicaments and biological substances: Z88.9

## 2021-05-11 HISTORY — DX: Obesity, unspecified: E66.9

## 2021-05-11 SURGERY — ESOPHAGOGASTRODUODENOSCOPY (EGD) WITH PROPOFOL
Anesthesia: General

## 2021-05-11 MED ORDER — SODIUM CHLORIDE 0.9 % IV SOLN: INTRAVENOUS | Status: DC

## 2021-05-11 MED ORDER — LIDOCAINE HCL (PF) 2 % IJ SOLN
INTRAMUSCULAR | Status: AC
  Filled 2021-05-11: qty 5

## 2021-05-11 NOTE — Progress Notes (Signed)
Patient procedure cancelled per Dr Haig Prophet due to poor/incomplete colon prep and did not stop his Plavix for the suggested length of time. Dr. Haig Prophet spoke at length with the patient and he agrees to reschedule of the appointment. Office to call the patient to reschedule the procedures.

## 2021-05-31 ENCOUNTER — Encounter: Payer: Self-pay | Admitting: *Deleted

## 2021-06-01 ENCOUNTER — Encounter
Admission: RE | Disposition: A | Discharge status: Home / Self Care | Payer: Self-pay | Source: Home / Self Care | Attending: Gastroenterology

## 2021-06-01 ENCOUNTER — Ambulatory Visit: Payer: Medicare HMO | Admitting: Anesthesiology

## 2021-06-01 ENCOUNTER — Encounter: Payer: Self-pay | Admitting: Gastroenterology

## 2021-06-01 ENCOUNTER — Ambulatory Visit
Admission: RE | Admit: 2021-06-01 | Discharge: 2021-06-01 | Disposition: A | Discharge status: Home / Self Care | Payer: Medicare HMO | Attending: Gastroenterology | Admitting: Gastroenterology

## 2021-06-01 DIAGNOSIS — I10 Essential (primary) hypertension: Secondary | ICD-10-CM | POA: Insufficient documentation

## 2021-06-01 DIAGNOSIS — K5730 Diverticulosis of large intestine without perforation or abscess without bleeding: Secondary | ICD-10-CM | POA: Diagnosis not present

## 2021-06-01 DIAGNOSIS — K449 Diaphragmatic hernia without obstruction or gangrene: Secondary | ICD-10-CM | POA: Diagnosis not present

## 2021-06-01 DIAGNOSIS — K317 Polyp of stomach and duodenum: Secondary | ICD-10-CM | POA: Diagnosis not present

## 2021-06-01 DIAGNOSIS — Z8673 Personal history of transient ischemic attack (TIA), and cerebral infarction without residual deficits: Secondary | ICD-10-CM | POA: Diagnosis not present

## 2021-06-01 DIAGNOSIS — K2970 Gastritis, unspecified, without bleeding: Secondary | ICD-10-CM | POA: Diagnosis not present

## 2021-06-01 DIAGNOSIS — K3189 Other diseases of stomach and duodenum: Secondary | ICD-10-CM | POA: Insufficient documentation

## 2021-06-01 DIAGNOSIS — Z7902 Long term (current) use of antithrombotics/antiplatelets: Secondary | ICD-10-CM | POA: Diagnosis not present

## 2021-06-01 DIAGNOSIS — D509 Iron deficiency anemia, unspecified: Secondary | ICD-10-CM | POA: Insufficient documentation

## 2021-06-01 DIAGNOSIS — K640 First degree hemorrhoids: Secondary | ICD-10-CM | POA: Insufficient documentation

## 2021-06-01 HISTORY — PX: ESOPHAGOGASTRODUODENOSCOPY (EGD) WITH PROPOFOL: SHX5813

## 2021-06-01 HISTORY — PX: COLONOSCOPY WITH PROPOFOL: SHX5780

## 2021-06-01 LAB — GLUCOSE, CAPILLARY: Glucose-Capillary: 122 mg/dL — ABNORMAL HIGH (ref 70–99)

## 2021-06-01 SURGERY — COLONOSCOPY WITH PROPOFOL
Anesthesia: General

## 2021-06-01 MED ORDER — PROPOFOL 10 MG/ML IV BOLUS
INTRAVENOUS | Status: DC | PRN
  Administered 2021-06-01: 20 mg via INTRAVENOUS
  Administered 2021-06-01: 30 mg via INTRAVENOUS

## 2021-06-01 MED ORDER — SODIUM CHLORIDE 0.9 % IV SOLN
INTRAVENOUS | Status: DC
  Administered 2021-06-01: 20 mL/h via INTRAVENOUS

## 2021-06-01 MED ORDER — DEXMEDETOMIDINE HCL IN NACL 200 MCG/50ML IV SOLN
INTRAVENOUS | Status: DC | PRN
Start: 2021-06-01 — End: 2021-06-01
  Administered 2021-06-01: 8 ug via INTRAVENOUS
  Administered 2021-06-01: 4 ug via INTRAVENOUS
  Administered 2021-06-01: 8 ug via INTRAVENOUS

## 2021-06-01 MED ORDER — PROPOFOL 500 MG/50ML IV EMUL
INTRAVENOUS | Status: AC
  Filled 2021-06-01: qty 50

## 2021-06-01 MED ORDER — GLYCOPYRROLATE 0.2 MG/ML IJ SOLN
INTRAMUSCULAR | Status: DC | PRN
  Administered 2021-06-01: .2 mg via INTRAVENOUS

## 2021-06-01 MED ORDER — PROPOFOL 500 MG/50ML IV EMUL
INTRAVENOUS | Status: DC | PRN
  Administered 2021-06-01: 50 ug/kg/min via INTRAVENOUS

## 2021-06-01 MED ORDER — LIDOCAINE HCL (CARDIAC) PF 100 MG/5ML IV SOSY
PREFILLED_SYRINGE | INTRAVENOUS | Status: DC | PRN
  Administered 2021-06-01: 80 mg via INTRAVENOUS

## 2021-06-01 MED ORDER — DEXMEDETOMIDINE HCL IN NACL 80 MCG/20ML IV SOLN
INTRAVENOUS | Status: AC
  Filled 2021-06-01: qty 20

## 2021-06-01 NOTE — Anesthesia Postprocedure Evaluation (Signed)
Anesthesia Post Note  Patient: Connor Hall  Procedure(s) Performed: COLONOSCOPY WITH PROPOFOL ESOPHAGOGASTRODUODENOSCOPY (EGD) WITH PROPOFOL  Patient location during evaluation: Endoscopy Anesthesia Type: General Level of consciousness: awake and alert Pain management: pain level controlled Vital Signs Assessment: post-procedure vital signs reviewed and stable Respiratory status: spontaneous breathing, nonlabored ventilation and respiratory function stable Cardiovascular status: blood pressure returned to baseline and stable Postop Assessment: no apparent nausea or vomiting Anesthetic complications: no   No notable events documented.   Last Vitals:  Vitals:   06/01/21 1134 06/01/21 1144  BP: 120/76 119/66  Pulse: 66 60  Resp: 13 14  Temp:    SpO2: 99% 98%    Last Pain:  Vitals:   06/01/21 1144  TempSrc:   PainSc: 0-No pain                 [place] Ouch

## 2021-06-01 NOTE — H&P (Signed)
Outpatient short stay form Pre-procedure 06/01/2021  Lesly Rubenstein, MD  Primary Physician: Baxter Hire, MD  Reason for visit:  IDA  History of present illness:    81 y/o gentleman with history of CVA on plavix with last dose being 6 days ago, constipation, and hypertension here for EGD/Colonoscopy for IDA that developed after a knee surgery. History of bilateral inguinal hernia repair and appendectomy. No family history of GI malignancies.    Current Facility-Administered Medications:    0.9 %  sodium chloride infusion, , Intravenous, Continuous, Alauna Hayden, Hilton Cork, MD, Last Rate: 20 mL/hr at 06/01/21 1003, 20 mL/hr at 06/01/21 1003  Medications Prior to Admission  Medication Sig Dispense Refill Last Dose   amLODipine (NORVASC) 10 MG tablet Take 10 mg by mouth every morning.    05/31/2021   clopidogrel (PLAVIX) 75 MG tablet Take 75 mg by mouth daily.   Past Week   ferrous sulfate 325 (65 FE) MG tablet Take 325 mg by mouth daily with breakfast.   Past Week   glipiZIDE (GLUCOTROL XL) 10 MG 24 hr tablet Take 10 mg by mouth daily with breakfast.   Past Week   hydrochlorothiazide (HYDRODIURIL) 25 MG tablet Take 25 mg by mouth daily.   05/31/2021   HYDROcodone-acetaminophen (NORCO) 7.5-325 MG tablet Take 1-2 tablets by mouth every 4 (four) hours as needed (pain). 60 tablet 0 Past Month   magnesium oxide (MAG-OX) 400 MG tablet Take 400 mg by mouth daily.   05/31/2021   Melatonin 10 MG TABS Take 10 mg by mouth at bedtime.   Past Week   metFORMIN (GLUCOPHAGE) 1000 MG tablet Take 1,000 mg by mouth 2 (two) times daily with a meal.   Past Week   mometasone (NASONEX) 50 MCG/ACT nasal spray Place 2 sprays into the nose daily.   Past Week   Multiple Vitamin (MULTIVITAMIN WITH MINERALS) TABS tablet Take 1 tablet by mouth daily. Centrum Silver   05/31/2021   ondansetron (ZOFRAN ODT) 4 MG disintegrating tablet Take 1 tablet (4 mg total) by mouth every 6 (six) hours as needed for nausea or vomiting.  20 tablet 0 Past Week   polyethylene glycol (MIRALAX / GLYCOLAX) 17 g packet Take 17 g by mouth daily.   05/31/2021   pravastatin (PRAVACHOL) 80 MG tablet Take 80 mg by mouth at bedtime.   05/31/2021   Probiotic Product (PROBIOTIC-10 PO) Take by mouth daily at 6 (six) AM.   Past Week   pyridOXINE (B-6) 50 MG tablet Take 50 mg by mouth daily.   Past Week   Salicylic Acid-Urea (KERASAL EX) Apply 1 application topically daily as needed (toe fungus).   Past Week   vitamin B-12 (CYANOCOBALAMIN) 1000 MCG tablet Take 1,000 mcg by mouth daily.   05/31/2021   acetaminophen (TYLENOL) 650 MG CR tablet Take 650 mg by mouth every 8 (eight) hours as needed for pain. (Patient not taking: Reported on 09/06/2020)      docusate sodium (COLACE) 100 MG capsule Take 1 capsule (100 mg total) by mouth 2 (two) times daily. (Patient not taking: Reported on 05/11/2021) 30 capsule 0    pantoprazole (PROTONIX) 20 MG tablet Take 1 tablet (20 mg total) by mouth daily. (Patient taking differently: Take 20 mg by mouth in the morning.) 30 tablet 1    sennosides-docusate sodium (SENOKOT-S) 8.6-50 MG tablet Take 1 tablet by mouth every other day. (Patient not taking: Reported on 05/11/2021)      traMADol (ULTRAM) 50 MG tablet Take  50 mg by mouth every 6 (six) hours as needed for moderate pain.  (Patient not taking: Reported on 05/11/2021)        Allergies  Allergen Reactions   Ace Inhibitors Swelling    Face and lips   Penicillins Other (See Comments)    CHILDHOOD ALLERGY  Has patient had a PCN reaction causing immediate rash, facial/tongue/throat swelling, SOB or lightheadedness with hypotension: Unknown Has patient had a PCN reaction causing severe rash involving mucus membranes or skin necrosis: Unknown Has patient had a PCN reaction that required hospitalization: No Has patient had a PCN reaction occurring within the last 10 years: No If all of the above answers are "NO", then may proceed with Cephalosporin use.     Aspirin Hives  and Rash   Other Palpitations    MSG-headache     Past Medical History:  Diagnosis Date   Abdominal hernia    Allergic genetic state    Anemia    Anxiety    Arthritis    Asthma    WELL CONTROLLED, had as a child   CAD (coronary artery disease)    MANAGING WITH MEDICINE-NO STENTS   Cancer ([place])    MELANOMA (NOSE, FACE AND HANDS), BASAL CELL   Cataract cortical, senile    Cervicalgia    Colon polyp    COVID-19 12/2019   Depression    Diabetes mellitus without complication ([place])    Difficult intubation    damage to throat   Diverticulitis    Dyspnea    GERD (gastroesophageal reflux disease)    Headache    Hernia of abdominal cavity    History of 2019 novel coronavirus disease (COVID-19) 12/2019   History of hiatal hernia    Hyperlipidemia    Hypertension    Obesity    PVD (peripheral vascular disease) ([place])    Sleep apnea    NO CPAP-PT DENIES SLEEP APNEA BUT STUDY WAS DONE AND PT WAS TOLD HE HAD SLEEP APNEA   Stroke (cerebrum) ([facility])    TIA (transient ischemic attack) 03/19/2020   Trigger middle finger of left hand 03/23/2014   Trigger ring finger of right hand 03/23/2014    Review of systems:  Otherwise negative.    Physical Exam  Gen: Alert, oriented. Appears stated age.  HEENT: PERRLA. Lungs: No respiratory distress CV: RRR Abd: soft, benign, no masses Ext: No edema    Planned procedures: Proceed with colonoscopy. The patient understands the nature of the planned procedure, indications, risks, alternatives and potential complications including but not limited to bleeding, infection, perforation, damage to internal organs and possible oversedation/side effects from anesthesia. The patient agrees and gives consent to proceed.  Please refer to procedure notes for findings, recommendations and patient disposition/instructions.     Lesly Rubenstein, MD [facility]

## 2021-06-01 NOTE — Anesthesia Preprocedure Evaluation (Signed)
Anesthesia Evaluation  Patient identified by MRN, date of birth, ID band Patient awake    Reviewed: Allergy & Precautions, Patient's Chart, lab work & pertinent test results  History of Anesthesia Complications (+) DIFFICULT AIRWAY  Airway Mallampati: I  TM Distance: >3 FB Neck ROM: Full    Dental no notable dental hx.    Pulmonary shortness of breath (mild), asthma , sleep apnea (Pt denies) , former smoker,    Pulmonary exam normal breath sounds clear to auscultation       Cardiovascular hypertension, Pt. on medications + CAD and + Peripheral Vascular Disease  Normal cardiovascular exam Rhythm:Regular Rate:Normal  EKG  2021  NSR  LAD  INCOM RBBB  1 DEG AVB   CARDIAC CATH 7/19 ? Mid LM lesion is 40% stenosed. ? Mid LM to [facility] LAD lesion is 40% stenosed with 40% stenosed side branch in Ost Cx. ? Prox LAD lesion is 35% stenosed. ? Ost Cx to Prox Cx lesion is 25% stenosed.   Assessment The patient has had progressive canadian class 3 anginal symptoms with a high probability stress test with inferior sichemia with risk factors including diabetes, high blood pressure and high cholesterol.  normal left ventricular function with ejection fraction of 60%  mild 2 vessel coronary artery disease   [place] to Lad with 40% stenosis    Neuro/Psych  Headaches, Anxiety Depression TIA (11/22)   GI/Hepatic hiatal hernia, GERD  Controlled and Medicated,  Endo/Other  diabetes, Well Controlled, Type 2, Oral Hypoglycemic Agents  Renal/GU      Musculoskeletal  (+) Arthritis ,   Abdominal Normal abdominal exam  (+)   Peds  Hematology  (+) Blood dyscrasia (PLAVIX LAST TAKEN 05/08/21 IN AM), anemia ,   Anesthesia Other Findings   Reproductive/Obstetrics                             Anesthesia Physical  Anesthesia Plan  ASA: 2  Anesthesia Plan: General   Post-op Pain Management:    Induction:  Intravenous  PONV Risk Score and Plan:   Airway Management Planned: Natural Airway and Nasal Cannula  Additional Equipment:   Intra-op Plan:   Post-operative Plan:   Informed Consent: I have reviewed the patients History and Physical, chart, labs and discussed the procedure including the risks, benefits and alternatives for the proposed anesthesia with the patient or authorized representative who has indicated his/her understanding and acceptance.     Dental Advisory Given  Plan Discussed with: Anesthesiologist, CRNA and Surgeon  Anesthesia Plan Comments: (Patient consented for risks of anesthesia including but not limited to:  - adverse reactions to medications - risk of airway placement if required - damage to eyes, teeth, lips or other oral mucosa - nerve damage due to positioning  - sore throat or hoarseness - Damage to heart, brain, nerves, lungs, other parts of body or loss of life  Patient voiced understanding.)        Anesthesia Quick Evaluation

## 2021-06-01 NOTE — Interval H&P Note (Signed)
History and Physical Interval Note:  06/01/2021 10:15 AM  Connor Hall  has presented today for surgery, with the diagnosis of IDA Chronic Constipation.  The various methods of treatment have been discussed with the patient and family. After consideration of risks, benefits and other options for treatment, the patient has consented to  Procedure(s) with comments: COLONOSCOPY WITH PROPOFOL (N/A) - DM ESOPHAGOGASTRODUODENOSCOPY (EGD) WITH PROPOFOL (N/A) as a surgical intervention.  The patient's history has been reviewed, patient examined, no change in status, stable for surgery.  I have reviewed the patient's chart and labs.  Questions were answered to the patient's satisfaction.     Lesly Rubenstein  Ok to proceed with EGD/Colonoscopy

## 2021-06-01 NOTE — Op Note (Signed)
[facility] Gastroenterology Patient Name: Connor Hall Procedure Date: 06/01/2021 10:06 AM MRN: [id number] Account #: [id number] Date of Birth: 10/14/1940 Admit Type: Outpatient Age: 81 Room: [facility] ENDO ROOM 3 Gender: Male Note Status: Finalized Instrument Name: Upper Endoscope [id number] Procedure:             Upper GI endoscopy Indications:           Iron deficiency anemia Providers:             Andrey Farmer MD, MD Referring MD:          Baxter Hire, MD (Referring MD) Medicines:             Monitored Anesthesia Care Complications:         No immediate complications. Estimated blood loss:                         Minimal. Procedure:             Pre-Anesthesia Assessment:                        - Prior to the procedure, a History and Physical was                         performed, and patient medications and allergies were                         reviewed. The patient is competent. The risks and                         benefits of the procedure and the sedation options and                         risks were discussed with the patient. All questions                         were answered and informed consent was obtained.                         Patient identification and proposed procedure were                         verified by the physician, the nurse, the                         anesthesiologist, the anesthetist and the technician                         in the endoscopy suite. Mental Status Examination:                         alert and oriented. Airway Examination: normal                         oropharyngeal airway and neck mobility. Respiratory                         Examination: clear to auscultation. CV Examination:  normal. Prophylactic Antibiotics: The patient does not                         require prophylactic antibiotics. Prior                         Anticoagulants: The patient has taken no previous                          anticoagulant or antiplatelet agents. ASA Grade                         Assessment: III - A patient with severe systemic                         disease. After reviewing the risks and benefits, the                         patient was deemed in satisfactory condition to                         undergo the procedure. The anesthesia plan was to use                         monitored anesthesia care (MAC). Immediately prior to                         administration of medications, the patient was                         re-assessed for adequacy to receive sedatives. The                         heart rate, respiratory rate, oxygen saturations,                         blood pressure, adequacy of pulmonary ventilation, and                         response to care were monitored throughout the                         procedure. The physical status of the patient was                         re-assessed after the procedure.                        After obtaining informed consent, the endoscope was                         passed under direct vision. Throughout the procedure,                         the patient's blood pressure, pulse, and oxygen                         saturations were monitored continuously. The Endoscope  was introduced through the mouth, and advanced to the                         second part of duodenum. The upper GI endoscopy was                         accomplished without difficulty. The patient tolerated                         the procedure well. Findings:      A small hiatal hernia was found. The Z-line was a variable distance from       incisors; the hiatal hernia was sliding.      Multiple small sessile fundic gland polyps with no bleeding and no       stigmata of recent bleeding were found in the gastric body.      Patchy minimal inflammation characterized by erythema was found in the       gastric antrum. Biopsies were taken with a cold forceps  for Helicobacter       pylori testing. Estimated blood loss was minimal.      Patchy erythematous mucosa and with no stigmata of bleeding was found in       the duodenal bulb. Impression:            - Small hiatal hernia.                        - Multiple fundic gland polyps.                        - Gastritis. Biopsied.                        - Erythematous duodenopathy. Recommendation:        - Await pathology results.                        - Perform a colonoscopy today. Procedure Code(s):     --- Professional ---                        [phone], Esophagogastroduodenoscopy, flexible,                         transoral; with biopsy, single or multiple Diagnosis Code(s):     --- Professional ---                        K44.9, Diaphragmatic hernia without obstruction or                         gangrene                        K31.7, Polyp of stomach and duodenum                        K29.70, Gastritis, unspecified, without bleeding                        K31.89, Other diseases of stomach and duodenum  D50.9, Iron deficiency anemia, unspecified CPT copyright 2019 American Medical Association. All rights reserved. The codes documented in this report are preliminary and upon coder review may  be revised to meet current compliance requirements. Andrey Farmer MD, MD 06/01/2021 11:17:59 AM Number of Addenda: 0 Note Initiated On: 06/01/2021 10:06 AM Estimated Blood Loss:  Estimated blood loss was minimal.      [facility]

## 2021-06-01 NOTE — Op Note (Signed)
[facility] Gastroenterology Patient Name: Connor Hall Procedure Date: 06/01/2021 10:05 AM MRN: [id number] Account #: [id number] Date of Birth: 01-01-1941 Admit Type: Outpatient Age: 81 Room: [facility] ENDO ROOM 3 Gender: Male Note Status: Finalized Instrument Name: Colonoscope [id number] Procedure:             Colonoscopy Indications:           Iron deficiency anemia Providers:             Andrey Farmer MD, MD Referring MD:          Baxter Hire, MD (Referring MD) Medicines:             Monitored Anesthesia Care Complications:         No immediate complications. Procedure:             Pre-Anesthesia Assessment:                        - Prior to the procedure, a History and Physical was                         performed, and patient medications and allergies were                         reviewed. The patient is competent. The risks and                         benefits of the procedure and the sedation options and                         risks were discussed with the patient. All questions                         were answered and informed consent was obtained.                         Patient identification and proposed procedure were                         verified by the physician, the nurse, the                         anesthesiologist, the anesthetist and the technician                         in the endoscopy suite. Mental Status Examination:                         alert and oriented. Airway Examination: normal                         oropharyngeal airway and neck mobility. Respiratory                         Examination: clear to auscultation. CV Examination:                         normal. Prophylactic Antibiotics: The patient does not  require prophylactic antibiotics. Prior                         Anticoagulants: The patient has taken Plavix                         (clopidogrel), last dose was 6 days prior to                          procedure. ASA Grade Assessment: III - A patient with                         severe systemic disease. After reviewing the risks and                         benefits, the patient was deemed in satisfactory                         condition to undergo the procedure. The anesthesia                         plan was to use monitored anesthesia care (MAC).                         Immediately prior to administration of medications,                         the patient was re-assessed for adequacy to receive                         sedatives. The heart rate, respiratory rate, oxygen                         saturations, blood pressure, adequacy of pulmonary                         ventilation, and response to care were monitored                         throughout the procedure. The physical status of the                         patient was re-assessed after the procedure.                        After obtaining informed consent, the colonoscope was                         passed under direct vision. Throughout the procedure,                         the patient's blood pressure, pulse, and oxygen                         saturations were monitored continuously. The                         Colonoscope was introduced through the anus and  advanced to the the cecum, identified by appendiceal                         orifice and ileocecal valve. The colonoscopy was                         performed with difficulty due to restricted mobility                         of the colon and a redundant colon. Successful                         completion of the procedure was aided by withdrawing                         the scope and replacing with the pediatric colonoscope                         and applying abdominal pressure. The patient tolerated                         the procedure well. The quality of the bowel                         preparation was fair. Findings:      The perianal  and digital rectal examinations were normal.      Many small and large-mouthed diverticula were found in the sigmoid colon       and descending colon.      Internal hemorrhoids were found during retroflexion. The hemorrhoids       were Grade I (internal hemorrhoids that do not prolapse).      The exam was otherwise without abnormality on direct and retroflexion       views. Impression:            - Preparation of the colon was fair.                        - Diverticulosis in the sigmoid colon and in the                         descending colon.                        - Internal hemorrhoids.                        - The examination was otherwise normal on direct and                         retroflexion views.                        - No specimens collected. Recommendation:        - Discharge patient to home.                        - Resume previous diet.                        - Resume Plavix (  clopidogrel) at prior dose today.                        - Continue present medications.                        - Repeat colonoscopy is not recommended due to current                         age (69 years or older) for screening purposes.                        - Return to referring physician as previously                         scheduled. Procedure Code(s):     --- Professional ---                        [phone], Colonoscopy, flexible; diagnostic, including                         collection of specimen(s) by brushing or washing, when                         performed (separate procedure) Diagnosis Code(s):     --- Professional ---                        K64.0, First degree hemorrhoids                        D50.9, Iron deficiency anemia, unspecified                        K57.30, Diverticulosis of large intestine without                         perforation or abscess without bleeding CPT copyright 2019 American Medical Association. All rights reserved. The codes documented in this report are  preliminary and upon coder review may  be revised to meet current compliance requirements. Andrey Farmer MD, MD 06/01/2021 11:21:54 AM Number of Addenda: 0 Note Initiated On: 06/01/2021 10:05 AM Scope Withdrawal Time: 0 hours 9 minutes 50 seconds  Total Procedure Duration: 0 hours 34 minutes 2 seconds  Estimated Blood Loss:  Estimated blood loss: none.      [facility]

## 2021-06-01 NOTE — Transfer of Care (Signed)
Immediate Anesthesia Transfer of Care Note  Patient: Connor Hall  Procedure(s) Performed: COLONOSCOPY WITH PROPOFOL ESOPHAGOGASTRODUODENOSCOPY (EGD) WITH PROPOFOL  Patient Location: PACU  Anesthesia Type:General  Level of Consciousness: sedated  Airway & Oxygen Therapy: Patient Spontanous Breathing and Patient connected to nasal cannula oxygen  Post-op Assessment: Report given to RN and Post -op Vital signs reviewed and stable  Post vital signs: Reviewed and stable  Last Vitals:  Vitals Value Taken Time  BP    Temp    Pulse 60 06/01/21 1114  Resp 15 06/01/21 1114  SpO2 97 % 06/01/21 1114  Vitals shown include unvalidated device data.  Last Pain:  Vitals:   06/01/21 0947  TempSrc: Temporal  PainSc: 0-No pain         Complications: No notable events documented.

## 2021-06-04 LAB — SURGICAL PATHOLOGY

## 2022-04-04 ENCOUNTER — Other Ambulatory Visit: Payer: Self-pay | Admitting: General Surgery

## 2022-04-08 LAB — SURGICAL PATHOLOGY

## 2022-04-14 ENCOUNTER — Emergency Department: Payer: Medicare HMO

## 2022-04-14 ENCOUNTER — Emergency Department
Admission: EM | Admit: 2022-04-14 | Discharge: 2022-04-14 | Disposition: A | Discharge status: Home / Self Care | Payer: Medicare HMO | Attending: Emergency Medicine | Admitting: Emergency Medicine

## 2022-04-14 ENCOUNTER — Other Ambulatory Visit: Payer: Self-pay

## 2022-04-14 DIAGNOSIS — I10 Essential (primary) hypertension: Secondary | ICD-10-CM | POA: Diagnosis not present

## 2022-04-14 DIAGNOSIS — E119 Type 2 diabetes mellitus without complications: Secondary | ICD-10-CM | POA: Diagnosis not present

## 2022-04-14 DIAGNOSIS — I2510 Atherosclerotic heart disease of native coronary artery without angina pectoris: Secondary | ICD-10-CM | POA: Insufficient documentation

## 2022-04-14 DIAGNOSIS — Z96651 Presence of right artificial knee joint: Secondary | ICD-10-CM | POA: Insufficient documentation

## 2022-04-14 DIAGNOSIS — Z85828 Personal history of other malignant neoplasm of skin: Secondary | ICD-10-CM | POA: Diagnosis not present

## 2022-04-14 DIAGNOSIS — R42 Dizziness and giddiness: Secondary | ICD-10-CM | POA: Insufficient documentation

## 2022-04-14 DIAGNOSIS — Z20822 Contact with and (suspected) exposure to covid-19: Secondary | ICD-10-CM | POA: Insufficient documentation

## 2022-04-14 DIAGNOSIS — R202 Paresthesia of skin: Secondary | ICD-10-CM | POA: Insufficient documentation

## 2022-04-14 DIAGNOSIS — M25562 Pain in left knee: Secondary | ICD-10-CM | POA: Insufficient documentation

## 2022-04-14 DIAGNOSIS — M25561 Pain in right knee: Secondary | ICD-10-CM | POA: Diagnosis not present

## 2022-04-14 DIAGNOSIS — J45909 Unspecified asthma, uncomplicated: Secondary | ICD-10-CM | POA: Diagnosis not present

## 2022-04-14 DIAGNOSIS — I639 Cerebral infarction, unspecified: Secondary | ICD-10-CM | POA: Insufficient documentation

## 2022-04-14 DIAGNOSIS — Z79899 Other long term (current) drug therapy: Secondary | ICD-10-CM | POA: Diagnosis not present

## 2022-04-14 DIAGNOSIS — R001 Bradycardia, unspecified: Secondary | ICD-10-CM | POA: Diagnosis not present

## 2022-04-14 DIAGNOSIS — R410 Disorientation, unspecified: Secondary | ICD-10-CM | POA: Insufficient documentation

## 2022-04-14 LAB — RESP PANEL BY RT-PCR (RSV, FLU A&B, COVID)  RVPGX2
Influenza A by PCR: NEGATIVE
Influenza B by PCR: NEGATIVE
Resp Syncytial Virus by PCR: NEGATIVE
SARS Coronavirus 2 by RT PCR: NEGATIVE

## 2022-04-14 LAB — DIFFERENTIAL
Abs Immature Granulocytes: 0.02 10*3/uL (ref 0.00–0.07)
Basophils Absolute: 0 10*3/uL (ref 0.0–0.1)
Basophils Relative: 1 %
Eosinophils Absolute: 0.2 10*3/uL (ref 0.0–0.5)
Eosinophils Relative: 4 %
Immature Granulocytes: 0 %
Lymphocytes Relative: 20 %
Lymphs Abs: 1.1 10*3/uL (ref 0.7–4.0)
Monocytes Absolute: 0.7 10*3/uL (ref 0.1–1.0)
Monocytes Relative: 12 %
Neutro Abs: 3.5 10*3/uL (ref 1.7–7.7)
Neutrophils Relative %: 63 %

## 2022-04-14 LAB — CBC
HCT: 38.4 % — ABNORMAL LOW (ref 39.0–52.0)
Hemoglobin: 12.8 g/dL — ABNORMAL LOW (ref 13.0–17.0)
MCH: 30.3 pg (ref 26.0–34.0)
MCHC: 33.3 g/dL (ref 30.0–36.0)
MCV: 90.8 fL (ref 80.0–100.0)
Platelets: 254 10*3/uL (ref 150–400)
RBC: 4.23 MIL/uL (ref 4.22–5.81)
RDW: 13.6 % (ref 11.5–15.5)
WBC: 5.6 10*3/uL (ref 4.0–10.5)
nRBC: 0 % (ref 0.0–0.2)

## 2022-04-14 LAB — URINALYSIS, ROUTINE W REFLEX MICROSCOPIC
Bilirubin Urine: NEGATIVE
Glucose, UA: NEGATIVE mg/dL
Hgb urine dipstick: NEGATIVE
Ketones, ur: NEGATIVE mg/dL
Leukocytes,Ua: NEGATIVE
Nitrite: NEGATIVE
Protein, ur: NEGATIVE mg/dL
Specific Gravity, Urine: 1.004 — ABNORMAL LOW (ref 1.005–1.030)
pH: 7 (ref 5.0–8.0)

## 2022-04-14 LAB — CBG MONITORING, ED: Glucose-Capillary: 121 mg/dL — ABNORMAL HIGH (ref 70–99)

## 2022-04-14 LAB — COMPREHENSIVE METABOLIC PANEL
ALT: 21 U/L (ref 0–44)
AST: 24 U/L (ref 15–41)
Albumin: 4.6 g/dL (ref 3.5–5.0)
Alkaline Phosphatase: 47 U/L (ref 38–126)
Anion gap: 8 (ref 5–15)
BUN: 21 mg/dL (ref 8–23)
CO2: 23 mmol/L (ref 22–32)
Calcium: 9.5 mg/dL (ref 8.9–10.3)
Chloride: 107 mmol/L (ref 98–111)
Creatinine, Ser: 0.93 mg/dL (ref 0.61–1.24)
GFR, Estimated: >60 mL/min (ref >60)
Glucose, Bld: 129 mg/dL — ABNORMAL HIGH (ref 70–99)
Potassium: 3.9 mmol/L (ref 3.5–5.1)
Sodium: 138 mmol/L (ref 135–145)
Total Bilirubin: 0.5 mg/dL (ref 0.3–1.2)
Total Protein: 7.6 g/dL (ref 6.5–8.1)

## 2022-04-14 LAB — ETHANOL: Alcohol, Ethyl (B): <10 mg/dL (ref <10)

## 2022-04-14 LAB — PROTIME-INR
INR: 0.9 (ref 0.8–1.2)
Prothrombin Time: 12.4 seconds (ref 11.4–15.2)

## 2022-04-14 LAB — TROPONIN I (HIGH SENSITIVITY)
Troponin I (High Sensitivity): 7 ng/L (ref <18)
Troponin I (High Sensitivity): 7 ng/L (ref <18)

## 2022-04-14 LAB — APTT: aPTT: 36 seconds (ref 24–36)

## 2022-04-14 MED ORDER — SODIUM CHLORIDE 0.9% FLUSH
3.0000 mL | Freq: Once | INTRAVENOUS | Status: AC
  Administered 2022-04-14: 3 mL via INTRAVENOUS

## 2022-04-14 MED ORDER — GADOBUTROL 1 MMOL/ML IV SOLN
10.0000 mL | Freq: Once | INTRAVENOUS | Status: AC | PRN
  Administered 2022-04-14: 10 mL via INTRAVENOUS

## 2022-04-14 NOTE — Consult Note (Addendum)
[facility] TeleNeurology Consult Services   Patient Name:   Connor Hall, Connor Hall Date of Birth:   May 05, 1941 Identification Number:   MRN - [id number] Date of Service:   04/14/2022 07:50:03  Diagnosis:       R20.2 - Paresthesia of skin  Impression:      81 year old male with HTN, HLD, DM and prior TIA who presents with dizziness and bilateral hand tingling. On Friday at [place], he started to feel confused and didn't feel well. He took a tramadol and felt better after a couple hours. He felt "blah" Saturday. He went to bed at midnight last night but didn't sleep well (he took melatonin). The last time he looked at his watch was 3AM and fell asleep. Then this morning, he woke up with tingling in both hands. When he stood up he felt dizziness (lightheadedness). He denies focal weakness, vertigo, speech changes, diplopia, or headache. On exam, he had tingling in both hands but was otherwise nonfocal. He could stand and walk. Head CT shows no hemorrhage per radiology.    Possibilities include some combination of orthostasis, manifestation of poor sleep, infection, anxiety, carpal tunnel syndrome and less likely CVA. Given vascular risk factors, it would be reasonable to get MRI brain with gad and MRA head and neck. He should have toxic-metabolic medical workup. He can continue Plavix.  Our recommendations are outlined below.  Recommendations IF ADMITTED:        Telemetry Floor       Neuro Checks       Bedside Swallow Eval       DVT Prophylaxis       IV Fluids, Normal Saline       Head of Bed 30 Degrees       Euglycemia and Avoid Hyperthermia (PRN Acetaminophen)       Continue Clopidogrel 75 mg daily       Antihypertensives PRN if Blood pressure is greater than 220/120 or there is a concern for End organ damage/contraindications for permissive HTN. If blood pressure is greater than 220/120 give labetalol PO or IV or Vasotec IV with a goal of 15% reduction in BP during the first 24  hours.    ------------------------------------------------------------------------------  Advanced Imaging: Advanced Imaging Deferred because:  The history and examination are not suggestive of LVO.   Metrics: Last Known Well: Unknown [facility] Notification Time: 04/14/2022 07:50:03 Arrival Time: 04/14/2022 07:21:00 Stamp Time: 04/14/2022 07:50:03 Initial Response Time: 04/14/2022 07:51:23 Symptoms: multiple complaints. Initial patient interaction: 04/14/2022 07:52:00 NIHSS Assessment Completed: 04/14/2022 08:01:57 Patient is not a candidate for Thrombolytic. Thrombolytic Medical Decision: 04/14/2022 08:01:57 Patient was not deemed candidate for Thrombolytic because of following reasons: Last Well Known Above 4.5 Hours.  As per Radiologist CT Showed no acute changes.  Primary Provider Notified of Diagnostic Impression and Management Plan on: 04/14/2022 08:09:35    ------------------------------------------------------------------------------  History of Present Illness: Patient is a 81 year old Male.  Patient was brought by private transportation with symptoms of multiple complaints. 81 year old male with HTN, HLD, DM and prior TIA who presents with dizziness and bilateral hand tingling. On Friday at [place], he started to feel confused and didn't feel well. He took a tramadol and felt better after a couple hours. He felt "blah" Saturday. He went to bed at midnight last night but didn't sleep well (he took melatonin). The last time he looked at his watch was 3AM and fell asleep. Then this morning, he woke up with tingling in both hands. When he stood up  he felt dizziness (lightheadedness). He denies focal weakness, vertigo, speech changes, diplopia, or headache.  He denies history of chiropractic manipulation.    Past Medical History:      Hypertension      Diabetes Mellitus      Hyperlipidemia  Medications:  No Anticoagulant use  Antiplatelet use: Yes  Plavix  Allergies:  Reviewed Description: Aspirin PCN,  Social History: Smoking: No  Family History:  There is no family history of premature cerebrovascular disease pertinent to this consultation  ROS : A complete pertinent Review of Systems was performed and was negative except mentioned in HPI.       Examination: BP(151/70), Pulse(61), 1A: Level of Consciousness - Alert; keenly responsive + 0 1B: Ask Month and Age - Both Questions Right + 0 1C: Blink Eyes & Squeeze Hands - Performs Both Tasks + 0 2: Test Horizontal Extraocular Movements - Normal + 0 3: Test Visual Fields - No Visual Loss + 0 4: Test Facial Palsy (Use Grimace if Obtunded) - Normal symmetry + 0 5A: Test Left Arm Motor Drift - No Drift for 10 Seconds + 0 5B: Test Right Arm Motor Drift - No Drift for 10 Seconds + 0 6A: Test Left Leg Motor Drift - No Drift for 5 Seconds + 0 6B: Test Right Leg Motor Drift - No Drift for 5 Seconds + 0 7: Test Limb Ataxia (FNF/Heel-Shin) - No Ataxia + 0 8: Test Sensation - Mild-Moderate Loss: Less Sharp/More Dull + 1 9: Test Language/Aphasia - Normal; No aphasia + 0 10: Test Dysarthria - Normal + 0 11: Test Extinction/Inattention - No abnormality + 0  NIHSS Score: 1  NIHSS Free Text : He hand tingling in both hands. He could stand and walk.  Pre-Morbid Modified Rankin Scale: 0 Points = No symptoms at all  Spoke with : ED Provider- Joni Fears  Patient/Family was informed the Neurology Consult would occur via TeleHealth consult by way of interactive audio and video telecommunications and consented to receiving care in this manner.   Patient is being evaluated for possible acute neurologic impairment and high probability of imminent or life-threatening deterioration. I spent total of 30 minutes providing care to this patient, including time for face to face visit via telemedicine, review of medical records, imaging studies and discussion of findings with providers, the patient  and/or family.   Dr Albertha Ghee   [facility] For Inpatient follow-up with [facility] physician please call [facility] [phone]. This is not an outpatient service. Post hospital discharge, please contact hospital directly.  Please do not communicate with [facility] physicians via secure chat. If you have any questions, Please contact [facility].

## 2022-04-14 NOTE — ED Notes (Signed)
Pt to MRI

## 2022-04-14 NOTE — ED Notes (Signed)
Pt in bed, pt states that she felt poorly Friday, states that today he awoke with some dizziness and some tingling in bilateral arms, pt is moving all extremities, pt has equal strength and sensation to touch bilaterally, pt awake and oriented, pt reports some forgetfulness, but states that this has been happening for the past several months.

## 2022-04-14 NOTE — ED Triage Notes (Addendum)
Pt states at 0630 he started to have bilateral arm tingling and dizziness. Pt states previous TIA. Pt also states his teeth hurt

## 2022-04-14 NOTE — Discharge Instructions (Addendum)
I spoke with your cardiologist, Dr. Nehemiah Massed.  He will be able to see you in the office Monday or Tuesday.  He does not want you to take the amlodipine for now.  The amlodipine could make the symptoms worse.  Rest and take it easy.  Return here if you get worse.  Please also follow-up with neurology for the memory problems you described and the tingling in both your hands.  Please return for any further worsening.  Make sure when you get up out of a chair or get up out of bed you get up very slowly.  When you get up out of bed for sit up in the bed wait for a minute or 2 then swing her legs over to the side sit that way for a minute or 2 and then stand up holding onto something.  Continue all your medications.  The MRI and the CT scan and the rest of the test that we have done today did not show anything that would cause these problems.  You are very slightly anemic and your blood glucose is very slightly elevated.  Your regular doctor can address those problems.  When you are getting up out of bed please do so very slowly and carefully.  For sit up in the bed and after a minute or 2 then swing her legs over the side of the bed sit like that for a minute or 2 and then stand up while holding onto something.  If you become very lightheaded please sit down or lay down and if that does not make the symptoms resolve, call 911.

## 2022-04-14 NOTE — ED Notes (Signed)
Pt's monitor shows a sinus brady, obtained and EKG, pt in bed with eyes closed, pt states that he feels a little bit light headed, states that he doesn't feel like the room is spinning, states that he just feels light headed. Md notified

## 2022-04-14 NOTE — ED Provider Notes (Signed)
[facility] Provider Note    Event Date/Time   First MD Initiated Contact with Patient 04/14/22 0740     (approximate)   History   Dizziness   HPI  Connor Hall is a 81 y.o. male who comes in complaining of waking up with bilateral hands tingling and feeling lightheaded when he got up.  Code stroke was called.  Patient provided further history that he felt out of sorts and possibly slightly confused a couple days ago possibly because of his knee pain.  He has chronic bilateral knee pain when knee was replaced the other 1 is probably needing replacement.  He took a tramadol and felt better.  He was okay last night when he went to bed but had some trouble staying asleep.  This morning he was woke up with the symptoms as noted above.  Patient also complains of his upper teeth hurting today.  He indicated its it is the incisors and canines on both sides.   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 Diabetes mellitus type 2, uncomplicated (HCC) FH: colon polyps Hyperlipidemia Hypertension Impingement syndrome, shoulder, right Left knee pain Malignant neoplasm of skin of nose Obesity Pain of left hand Primary osteoarthritis of left knee Rotator cuff tear, non-traumatic, right SOBOE (shortness of breath on exertion) Subcoracoid bursitis of right shoulder Tear of meniscus of knee GERD (gastroesophageal reflux disease) Diabetes mellitus without complication (HCC) Sleep apnea TIA (transient ischemic attack) Aphasia History of total knee arthroplasty, right Constipation Asthma Hyponatremia S/P total knee arthroplasty, right Physical Exam   Triage Vital Signs: ED Triage Vitals  Enc Vitals Group     BP 04/14/22 0734 (!) 151/70     Pulse Rate 04/14/22 0734 64     Resp 04/14/22 0734 20      Temp 04/14/22 0806 98.4 F (36.9 C)     Temp Source 04/14/22 0806 Oral     SpO2 04/14/22 0734 96 %     Weight 04/14/22 0725 208 lb (94.3 kg)     Height 04/14/22 0725 6' (1.829 m)     Head Circumference --      Peak Flow --      Pain Score 04/14/22 0724 2     Pain Loc --      Pain Edu? --      Excl. in [place]? --     Most recent vital signs: Vitals:   04/14/22 1225 04/14/22 1230  BP:  127/70  Pulse:  (!) 55  Resp:  15  Temp: 97.8 F (36.6 C)   SpO2:  98%     General: Awake, no distress.  Head normocephalic atraumatic Eyes pupils equal round reactive extraocular movements intact Mouth is normal CV:  Good peripheral perfusion.  Resp:  Normal effort.  Lungs are clear Abd:  No distention soft and nontender. .  Neuro exam cranial nerves II through XII are intact although visual fields were not checked.  Cerebellar finger-nose rapid alternating movements in the hands and heel-to-shin are all normal.  Motor strength is 5/5 throughout patient reports only tingling in the tips of his fingers bilaterally now.  No other numbness.  Patient walks without difficulty in the emergency room.   ED Results / Procedures / Treatments   Labs (all labs ordered are listed, but only abnormal results are displayed) Labs Reviewed  CBC -  Abnormal; Notable for the following components:      Result Value   Hemoglobin 12.8 (*)    HCT 38.4 (*)    All other components within normal limits  COMPREHENSIVE METABOLIC PANEL - Abnormal; Notable for the following components:   Glucose, Bld 129 (*)    All other components within normal limits  URINALYSIS, ROUTINE W REFLEX MICROSCOPIC - Abnormal; Notable for the following components:   Color, Urine STRAW (*)    APPearance CLEAR (*)    Specific Gravity, Urine 1.004 (*)    All other components within normal limits  CBG MONITORING, ED - Abnormal; Notable for the following components:   Glucose-Capillary 121 (*)    All other components within normal limits   RESP PANEL BY RT-PCR (RSV, FLU A&B, COVID)  RVPGX2  PROTIME-INR  APTT  DIFFERENTIAL  ETHANOL  TROPONIN I (HIGH SENSITIVITY)  TROPONIN I (HIGH SENSITIVITY)     EKG  EKG read interpreted by me shows normal sinus rhythm rate of 60 left axis no acute ST-T changes are seen PR interval is prolonged at 223 ms   RADIOLOGY Chest x-ray read by radiology reviewed and interpreted by me is negative CT of the head read by radiology reviewed and interpreted by me does not show any acute disease.  CT also does not show any sinus problems.  PROCEDURES:  Critical Care performed:   Procedures   MEDICATIONS ORDERED IN ED: Medications  sodium chloride flush (NS) 0.9 % injection 3 mL (3 mLs Intravenous Given 04/14/22 0826)  gadobutrol (GADAVIST) 1 MMOL/ML injection 10 mL (10 mLs Intravenous Contrast Given 04/14/22 1054)     IMPRESSION / MDM / ASSESSMENT AND PLAN / ED COURSE  I reviewed the triage vital signs and the nursing notes. Patient's blood glucose is slightly elevated at 129 otherwise his labs look okay..  CT was negative.  Neurology feels that this is likely not a stroke but asked for an MRI and MRA as well.  These have been ordered. ----------------------------------------- 12:37 PM on 04/14/2022 ----------------------------------------- Patient with at least 2 episodes of lightheadedness while laying down.  Each time his heart rate has dropped into the low 50s or high 40s.  He feels better when the heart rate comes up again.  Heart rate will go up and down without any particular stimulation or other intervention.  Troponins are negative.  EKG repeated and read and interpreted by me when his heart rate was in the low 50s only shows sinus bradycardia at a rate of 52 left axis no acute changes. Discussed patient in detail with Dr. Nehemiah Massed, his cardiologist.  He feels the patient can follow-up in the office.  He should be out of see him tomorrow or Tuesday at the latest.  I will inform the  patient.  He will not take his amlodipine until he sees Dr. Nehemiah Massed.  Will return if worse. Differential diagnosis includes, but is not limited to, symptomatic bradycardia.  Patient's presentation is most consistent with acute complicated illness / injury requiring diagnostic workup.  The patient is on the cardiac monitor to evaluate for evidence of arrhythmia and/or significant heart rate changes.  Only symptomatic bradycardia is seen.  Clinical Course as of 04/14/22 1249  Sun Apr 14, 2022  0810 Case d/w stroke Neurologist who recommends MRI/MRA brain. If negative for acute CVA or critical vascular stenosis, and pt is feeling better, pt would be clear for discharge. [PS]    Clinical Course User Index [PS] Carrie Mew, MD  FINAL CLINICAL IMPRESSION(S) / ED DIAGNOSES   Final diagnoses:  Symptomatic bradycardia     Rx / DC Orders   ED Discharge Orders     None        Note:  This document was prepared using Dragon voice recognition software and may include unintentional dictation errors.   Nena Polio, MD 04/14/22 1256

## 2022-04-14 NOTE — ED Notes (Signed)
As per Dr. Joni Fears, code stroke activated and charge RN notified. CT called and pt taken to CT

## 2022-04-14 NOTE — ED Notes (Signed)
Pt in bed, wife at bedside, pt talking with teleneuro.

## 2022-04-14 NOTE — Progress Notes (Signed)
8550 stroke cart activated and elert sent to [place]. Pt already in CT and has been assessed by EDP in triage. 0743 Pt back from CT. EDP Cinda Quest, MD) and assessing pt.  1586 [facility] paged [phone] Raoul Pitch, MD) connected via stroke cart and assessing pt at this time.   Santa Genera, Print production planner

## 2022-08-07 ENCOUNTER — Other Ambulatory Visit: Payer: Self-pay

## 2022-08-07 ENCOUNTER — Encounter: Payer: Self-pay | Admitting: *Deleted

## 2022-08-07 ENCOUNTER — Emergency Department: Payer: Medicare HMO

## 2022-08-07 DIAGNOSIS — R232 Flushing: Secondary | ICD-10-CM | POA: Insufficient documentation

## 2022-08-07 DIAGNOSIS — R42 Dizziness and giddiness: Secondary | ICD-10-CM | POA: Insufficient documentation

## 2022-08-07 DIAGNOSIS — Z5321 Procedure and treatment not carried out due to patient leaving prior to being seen by health care provider: Secondary | ICD-10-CM | POA: Diagnosis not present

## 2022-08-07 LAB — BASIC METABOLIC PANEL
Anion gap: 11 (ref 5–15)
BUN: 25 mg/dL — ABNORMAL HIGH (ref 8–23)
CO2: 24 mmol/L (ref 22–32)
Calcium: 9.3 mg/dL (ref 8.9–10.3)
Chloride: 105 mmol/L (ref 98–111)
Creatinine, Ser: 1.21 mg/dL (ref 0.61–1.24)
GFR, Estimated: >60 mL/min (ref >60)
Glucose, Bld: 171 mg/dL — ABNORMAL HIGH (ref 70–99)
Potassium: 4 mmol/L (ref 3.5–5.1)
Sodium: 140 mmol/L (ref 135–145)

## 2022-08-07 LAB — CBC
HCT: 37.9 % — ABNORMAL LOW (ref 39.0–52.0)
Hemoglobin: 12.5 g/dL — ABNORMAL LOW (ref 13.0–17.0)
MCH: 30.9 pg (ref 26.0–34.0)
MCHC: 33 g/dL (ref 30.0–36.0)
MCV: 93.8 fL (ref 80.0–100.0)
Platelets: 236 10*3/uL (ref 150–400)
RBC: 4.04 MIL/uL — ABNORMAL LOW (ref 4.22–5.81)
RDW: 13.5 % (ref 11.5–15.5)
WBC: 6.4 10*3/uL (ref 4.0–10.5)
nRBC: 0 % (ref 0.0–0.2)

## 2022-08-07 LAB — TROPONIN I (HIGH SENSITIVITY): Troponin I (High Sensitivity): 11 ng/L (ref <18)

## 2022-08-07 NOTE — ED Triage Notes (Signed)
Pt brought in via ems from home.  Pt reports flushed feeling in back of neck and head.  Pt reports dizziness.  No headache.  Pt alert speech clear.

## 2022-08-07 NOTE — ED Notes (Addendum)
Pt inquiring about wait time at this time. Pt informed about wait time. Pt stated, "I am just going to leave and come back tomorrow." Lattie Haw, RN made aware. IV removed from pt left forearm at this time. Repeat troponin was not drawn.

## 2022-08-08 ENCOUNTER — Emergency Department
Admission: EM | Admit: 2022-08-08 | Discharge: 2022-08-08 | Discharge status: Against Medical Advice | Payer: Medicare HMO | Attending: Emergency Medicine | Admitting: Emergency Medicine

## 2023-12-23 NOTE — Progress Notes (Signed)
 Established Patient Visit   Chief Complaint: Coronary artery disease  Date of Service: 12/23/2023 Date of Birth: April 08, 1941 PCP: Rudolpho Norleen Lenis, MD  History of Present Illness: Connor Hall is a 83 y.o.male patient who presented for coronary artery disease  Past medical history significant for coronary artery disease-heart cath 2019 with mild to moderate multivessel CAD, hypertension, hyperlipidemia, diabetes type 2.  Echocardiogram 04/2022 with normal biventricular systolic function, LVEF 50% with mild MR/TR.  Exercise stress echocardiogram 05/2020 normal with no ischemia, exercised for 6 minutes.  Patient details that he has doing well from cardiac standpoint.  Stays active working around the house.  No complaints of chest pain/pressure, shortness of breath, palpitation, dizziness or syncope.  EKG today showed sinus rhythm without any ischemic changes.  Past Medical and Surgical History  Past Medical History Past Medical History:  Diagnosis Date  . Abdominal hernia   . Allergic state   . Anemia 10/11/2016  . Anxiety   . Arthritis   . Asthma without status asthmaticus   . Cancer (CMS/HHS-HCC) Skin Cancer  . Cataract cortical, senile Removed 11/17 & 1/18  . Cervicalgia   . Colon polyp   . COVID-19 12/18/2019  . COVID-19 12/2019  . Diabetes mellitus type 2, uncomplicated (CMS/HHS-HCC) 11/03/2013  . Diverticulitis   . Diverticulosis   . Dyspnea   . GERD (gastroesophageal reflux disease)   . Headache   . Hernia of abdominal cavity   . History of hiatal hernia   . Hyperlipidemia   . Hypertension   . Hypogonadism in male   . Microalbuminuria   . Obesity   . Sleep apnea   . 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 Trigger middle finger of left hand 03/23/2014  . Trigger ring finger of right hand 03/23/2014    Past Surgical History He has a past surgical history that includes Appendectomy; Trigger release (Right); Back surgery; Colonoscopy (06/19/2011,  02/20/2006); egd (06/19/2011); Arthrocopic release of the long head of the biceps tendon, followed by arthroscopic subacromial decompression and mini incision rotator cuff repair (Right, 07/01/2014); Knee arthroscopy (Left, 08/30/2015); Sigmoidoscopy; Colonoscopy (01/15/2017); Upper gastrointestinal endoscopy; egd (01/15/2017); Cataract extraction (Left, 05/29/2016); Tonsillectomy; left heart cath and coronary angiography (Left, 11/24/2017); Cataract extraction (Right, 04/03/2016); endoscopic carpal tunnel release (Bilateral); Mohs surgery; Inguinal hernia repair (Right, 05/19/2019); Inguinal hernia repair (Left, 06/21/2020); Colonoscopy (06/01/2021); egd (06/01/2021); Replacement total knee (Right, 2022); and excision back cyst (04/04/2022).   Medications and Allergies  Current Medications  Current Outpatient Medications  Medication Sig Dispense Refill  . amLODIPine  (NORVASC ) 5 MG tablet Take 1 tablet (5 mg total) by mouth once daily 90 tablet 3  . blood glucose diagnostic test strip Use once daily Use as instructed. 100 each 3  . clopidogreL  (PLAVIX ) 75 mg tablet TAKE 1 TABLET BY MOUTH EVERY DAY 90 tablet 1  . cyanocobalamin  (VITAMIN B12) 1000 MCG tablet Take 1 tablet (1,000 mcg total) by mouth once daily    . ferrous sulfate  325 (65 FE) MG tablet Take 1 tablet (325 mg total) by mouth daily with breakfast    . glipiZIDE  (GLUCOTROL  XL) 10 MG XL tablet TAKE 1 TABLET EVERY MORNING 90 tablet 1  . loratadine (CLARITIN) 10 mg tablet TAKE 1 TABLET BY MOUTH EVERY DAY 90 tablet 3  . melatonin 10 mg Tab Take 1 tablet by mouth at bedtime    . metFORMIN  (GLUCOPHAGE ) 1000 MG tablet TAKE 1 TABLET (1,000 MG TOTAL) BY MOUTH 2 (TWO) TIMES DAILY WITH MEALS  FOR 180 DAYS 180 tablet 1  . mometasone (NASONEX) 50 mcg/actuation nasal spray Place 2 sprays into both nostrils once daily 51 g 3  . multivit-min-FA-lycopen-lutein 300-600-300 mcg Tab Take 1 tablet by mouth once daily      . pantoprazole  (PROTONIX ) 20 MG DR  tablet TAKE 1 TABLET BY MOUTH EVERY DAY 90 tablet 1  . polyethylene glycol (MIRALAX ) packet Take 1 packet (17 g total) by mouth once daily Mix in 4-8ounces of fluid prior to taking. (Patient taking differently: Take 17 g by mouth as needed Mix in 4-8ounces of fluid prior to taking.) 14 packet 0  . pravastatin  (PRAVACHOL ) 80 MG tablet Take 1 tablet (80 mg total) by mouth once daily 90 tablet 3  . pyridoxine, vitamin B6, (VITAMIN B-6) 50 MG tablet Take 1 tablet (50 mg total) by mouth once daily    . traMADoL  (ULTRAM ) 50 mg tablet Take 1 tablet (50 mg total) by mouth every 6 (six) hours as needed 60 tablet 0  . triamcinolone 0.1 % cream Apply topically 2 (two) times daily as needed    . ONETOUCH ULTRAMINI kit Use as instructed. 1 each 0  . promethazine-dextromethorphan (PROMETHAZINE-DM) 6.25-15 mg/5 mL syrup Take 5 mLs by mouth every 6 (six) hours as needed (Patient not taking: Reported on 12/23/2023) 120 mL 0   No current facility-administered medications for this visit.    Allergies: Ace inhibitors, Arb-angiotensin receptor antagonist, Aspirin , Penicillins, and Other  Social and Family History  Social History  reports that he quit smoking about 50 years ago. His smoking use included cigarettes. He started smoking about 65 years ago. He has a 15 pack-year smoking history. He has never been exposed to tobacco smoke. He has never used smokeless tobacco. He reports that he does not drink alcohol and does not use drugs.  Family History Family History  Problem Relation Name Age of Onset  . Pancreatic cancer Mother    . Myocardial Infarction (Heart attack) Father Betty Brooks 71  . Colon polyps Father Ziyan Schoon        Deceased 8026  . Blindness Sister    . Diabetes Brother    . Diabetes Maternal Grandmother    . No Known Problems Maternal Grandfather    . No Known Problems Paternal Grandmother    . No Known Problems Paternal Grandfather    . Diabetes Daughter    . Diabetes Brother       Review of Systems   Review of Systems: The patient denies chest pain, shortness of breath, orthopnea, paroxysmal nocturnal dyspnea, pedal edema, palpitations, heart racing, presyncope, syncope.   Physical Examination   Vitals:BP 130/68 (BP Location: Left upper arm, Patient Position: Sitting, BP Cuff Size: Small Adult)   Pulse 75   Ht 182.9 cm (6')   Wt 97.1 kg (214 lb)   SpO2 97%   BMI 29.02 kg/m  Ht:182.9 cm (6') Wt:97.1 kg (214 lb) ADJ:Anib surface area is 2.22 meters squared. Body mass index is 29.02 kg/m.  HEENT: Pupils equally reactive to light and accomodation  Neck: Supple, no significant JVD Lungs: clear to auscultation bilaterally; no wheezes, rales, rhonchi Heart: Regular rate and rhythm. No murmur Extremities: no pedal edema  Assessment and Plan   83 y.o. male with  Coronary artery disease Hypertension Hyperlipidemia Diabetes mellitus type 2  Stable from cardiac standpoint without any anginal symptoms. Blood pressure well-controlled, continue amlodipine . Continue statin, LDL at goal He is on Plavix  with allergy to aspirin   Heart healthy  diet and regular activity  Orders Placed This Encounter  Procedures  . ECG 12-lead    Return in about 6 months (around 06/24/2024).  KRISHNA CHAITANYA ALLURI, MD  This dictation was prepared with dragon dictation. Any transcription errors that result from this process are unintentional.

## 2023-12-24 ENCOUNTER — Ambulatory Visit: Attending: Neurology

## 2023-12-24 DIAGNOSIS — R41841 Cognitive communication deficit: Secondary | ICD-10-CM | POA: Diagnosis present

## 2023-12-24 NOTE — Therapy (Signed)
 OUTPATIENT SPEECH LANGUAGE PATHOLOGY  EVALUATION   Patient Name: Connor Hall MRN: [id number] DOB:1940/09/27, 83 y.o., male Today's Date: 12/24/2023  PCP: Norleen Rower, MD REFERRING PROVIDER: Allyson Stallion, FNP   End of Session - 12/24/23 1437     Visit Number 1    Number of Visits 10    Date for SLP Re-Evaluation 03/17/24    SLP Start Time 1320    SLP Stop Time  1400    SLP Time Calculation (min) 40 min    Activity Tolerance Patient tolerated treatment well           Patient Active Problem List   Diagnosis Date Noted   S/P total knee arthroplasty, right 09/12/2020   Constipation 09/11/2020   Asthma 09/11/2020   Hyponatremia 09/11/2020   History of total knee arthroplasty, right 09/06/2020   Aphasia 03/20/2020   TIA (transient ischemic attack) 03/19/2020   GERD (gastroesophageal reflux disease)    Diabetes mellitus without complication (HCC)    Sleep apnea    Coronary artery disease involving native coronary artery of native heart 12/08/2017   Angina decubitus (HCC) 11/24/2017   SOBOE (shortness of breath on exertion) 11/20/2017   Pain of left hand 08/21/2017   Carpal tunnel syndrome of left wrist 08/01/2017   Iron deficiency anemia 10/11/2016   FH: colon polyps 10/11/2016   Primary osteoarthritis of left knee 10/09/2015   Tear of meniscus of knee 07/26/2015   Left knee pain 07/24/2015   Subcoracoid bursitis of right shoulder 12/09/2014   Rotator cuff tear, non-traumatic, right 07/07/2014   Impingement syndrome, shoulder, right 03/23/2014   Malignant neoplasm of skin of nose 02/07/2014   Asthma without status asthmaticus 11/03/2013   Diabetes mellitus type 2, uncomplicated (HCC) 11/03/2013   Hyperlipidemia 11/03/2013   Hypertension 11/03/2013   Obesity 11/03/2013    ONSET DATE: referral date 12/17/23   REFERRING DIAG: mild cognitive impairment  THERAPY DIAG:  Cognitive communication deficit  Rationale for Evaluation and Treatment  Rehabilitation  SUBJECTIVE:   SUBJECTIVE STATEMENT: Pt alert, pleasant, and cooperative Pt accompanied by: self  PERTINENT HISTORY & DIAGNOSTIC FINDINGS: Pt is an 83 y.o. male who presents for cognitive-communication evaluation in setting of mild cognitive impairment. Pt with reports of gradual memory loss. PMHx including, but not limited to, heart cath in 2019, CAD, HTN, HLD, DMII, TIA, and anxiety. Head CT 08/07/22, Brain: Stable chronic small-vessel ischemic changes throughout the periventricular white matter. No evidence of acute infarct or hemorrhage. Lateral ventricles and midline structures are unremarkable. There are no acute extra-axial fluid collections. No mass effect. Vascular: Stable atherosclerosis of the internal carotid arteries. No hyperdense vessels... 12/17/23: Beta-amyloid 42/40 Ratio .93L     PAIN:  Are you having pain? FACES 0/10   FALLS: Has patient fallen in last 6 months?  No  LIVING ENVIRONMENT: Lives with: lives with their spouse Lives in: House/apartment  PLOF:  Level of assistance: Independent with ADLs Employment: Agricultural consultant work   PATIENT GOALS   improve recall of names  OBJECTIVE:   COGNITIVE COMMUNICATION: Overall cognitive status: Impaired Areas of impairment:  Attention: Impaired: Comment: working Memory: Impaired: Short term Auditory comprehension: WFL Verbal expression: Impaired: reduced verbal fluency  Functional communication: WFL Functional deficits: reports difficulty recalling names/places as well occasional trouble with recalling details for appointments  AUDITORY COMPREHENSION: WFL; occasional repetition due to hearing difficulty; pt planning to f/u with Audiology for consideration of hearing aids   READING COMPREHENSION: Intact  EXPRESSION: verbal  VERBAL EXPRESSION: Level of  generative/spontaneous verbalization: sentence and conversation Automatic speech: name: intact and social response: intact  Repetition: Appears  intact Naming: Divergent: reduced Pragmatics: Appears intact   WRITTEN EXPRESSION: Dominant hand: right   Written expression: Appears intact  MOTOR SPEECH: Overall motor speech: Appears intact  ORAL MOTOR EXAMINATION: No functional deficits  STANDARDIZED ASSESSMENTS: Addenbrooke's Cognitive Examination - ACE III The Addenbrooke's Cognitive Examination-III (ACE-III) is a brief cognitive test that assesses five cognitive domains. The total score is 100 with higher scores indicating better cognitive functioning. Cut off scores of 88 and 82 are recommended for suspicion of dementia (88 has sensitivity of 1.00 and specificity of 0.96, 82 has sensitivity of 0.93 and specificity of 1.00). American Version A  Attention 14/18  Memory 14/26  Fluency 9/14  Language 26/26  Visuospatial 16/16  TOTAL ACE- III Score 79/100     PATIENT REPORTED OUTCOME MEASURES (PROM): Deferred at this time   TODAY'S TREATMENT:  Pt educated re: role of SLP, rationale for ST, results of assessment, domains of cognition, emphasis on compensation and education in tx, and SLP POC.   PATIENT EDUCATION: Education details: as above Person educated: Patient Education method: Explanation Education comprehension: verbalized understanding  HOME EXERCISE PROGRAM:        To be given in upcoming sessions    GOALS:  Goals reviewed with patient? Yes  SHORT TERM GOALS: Target date: 10 sessions  With Min A, pt will verbalize and implement x3 attention and memory strategies to aid daily functioning at home and work with over 2 sessions.  Baseline: Goal status: INITIAL   2.  With min A, pt will utilize compensatory strategies for anomia during structured tasks. Baseline:  Goal status: INITIAL    LONG TERM GOALS: Target date: 12 weeks  Patient will demonstrate knowledge of appropriate activities to support cognitive and language function outside of ST. Baseline:  Goal status:  INITIAL    ASSESSMENT:  CLINICAL IMPRESSION:  Pt is an 83 y.o. male who presents for cognitive-communication evaluation in setting of mild cognitive impairment. Pt with reports of gradual memory loss. PMHx including, but not limited to, heart cath in 2019, CAD, HTN, HLD, DMII, TIA, and anxiety. Assessment completed via formal means (Addenbrooke's Cognitive Evaluation - III) and diagnostic interviewing. Pt presents with mild cognitive-linguistic deficits affecting attention, memory, and verbal fluency. Pt reports difficulty remembering names of places and people as well as difficulty managing the details of what he needs to remember (e.g. taking a medication prior to dentist). Recommend course of ST with emphasis on compensation and education for above mentioned deficits.    OBJECTIVE IMPAIRMENTS include attention, memory, and verbal fluecny. These impairments are limiting patient from managing appointments and effectively communicating at home and in community. Factors affecting potential to achieve goals and functional outcome are ongoing medical workup pending.. Patient will benefit from skilled SLP services to address above impairments and improve overall function.  REHAB POTENTIAL: Good  PLAN: SLP FREQUENCY: 1x/week  SLP DURATION: 12 weeks  PLANNED INTERVENTIONS: Cognitive reorganization, Internal/external aids, Functional tasks, SLP instruction and feedback, Compensatory strategies, and Patient/family education    Delon Bangs, M.S., CCC-SLP Speech-Language Pathologist [facility] [phone] [place])  [facility] at [facility] [address] [place], [place], [address] Phone: [phone]   Fax:  [phone]

## 2024-01-14 ENCOUNTER — Ambulatory Visit: Attending: Neurology

## 2024-01-14 DIAGNOSIS — R41841 Cognitive communication deficit: Secondary | ICD-10-CM | POA: Diagnosis present

## 2024-01-14 NOTE — Therapy (Addendum)
 OUTPATIENT SPEECH LANGUAGE PATHOLOGY  TREATMENT   Patient Name: Connor Hall MRN: [id number] DOB:09/01/1940, 83 y.o., male Today's Date: 01/14/2024  PCP: Norleen Rower, MD REFERRING PROVIDER: Allyson Stallion, FNP   End of Session - 01/14/24 1425     Visit Number 2    Number of Visits 10    Date for SLP Re-Evaluation 03/17/24    SLP Start Time 1315    SLP Stop Time  1400    SLP Time Calculation (min) 45 min    Activity Tolerance Patient tolerated treatment well           Patient Active Problem List   Diagnosis Date Noted   S/P total knee arthroplasty, right 09/12/2020   Constipation 09/11/2020   Asthma 09/11/2020   Hyponatremia 09/11/2020   History of total knee arthroplasty, right 09/06/2020   Aphasia 03/20/2020   TIA (transient ischemic attack) 03/19/2020   GERD (gastroesophageal reflux disease)    Diabetes mellitus without complication (HCC)    Sleep apnea    Coronary artery disease involving native coronary artery of native heart 12/08/2017   Angina decubitus (HCC) 11/24/2017   SOBOE (shortness of breath on exertion) 11/20/2017   Pain of left hand 08/21/2017   Carpal tunnel syndrome of left wrist 08/01/2017   Iron deficiency anemia 10/11/2016   FH: colon polyps 10/11/2016   Primary osteoarthritis of left knee 10/09/2015   Tear of meniscus of knee 07/26/2015   Left knee pain 07/24/2015   Subcoracoid bursitis of right shoulder 12/09/2014   Rotator cuff tear, non-traumatic, right 07/07/2014   Impingement syndrome, shoulder, right 03/23/2014   Malignant neoplasm of skin of nose 02/07/2014   Asthma without status asthmaticus 11/03/2013   Diabetes mellitus type 2, uncomplicated (HCC) 11/03/2013   Hyperlipidemia 11/03/2013   Hypertension 11/03/2013   Obesity 11/03/2013    ONSET DATE: referral date 12/17/23   REFERRING DIAG: mild cognitive impairment  THERAPY DIAG:  Cognitive communication deficit  Rationale for Evaluation and Treatment  Rehabilitation  SUBJECTIVE:   SUBJECTIVE STATEMENT: Pt alert, pleasant, and cooperative Pt accompanied by: self  PERTINENT HISTORY & DIAGNOSTIC FINDINGS: Pt is an 83 y.o. male who presents for cognitive-communication evaluation in setting of mild cognitive impairment. Pt with reports of gradual memory loss. PMHx including, but not limited to, heart cath in 2019, CAD, HTN, HLD, DMII, TIA, and anxiety. Head CT 08/07/22, Brain: Stable chronic small-vessel ischemic changes throughout the periventricular white matter. No evidence of acute infarct or hemorrhage. Lateral ventricles and midline structures are unremarkable. There are no acute extra-axial fluid collections. No mass effect. Vascular: Stable atherosclerosis of the internal carotid arteries. No hyperdense vessels... 12/17/23: Beta-amyloid 42/40 Ratio .93L     PAIN:  Are you having pain? FACES 0/10   FALLS: Has patient fallen in last 6 months?  No  LIVING ENVIRONMENT: Lives with: lives with their spouse Lives in: House/apartment  PLOF:  Level of assistance: Independent with ADLs Employment: Agricultural consultant work   PATIENT GOALS   improve recall of names  OBJECTIVE:   PATIENT REPORTED OUTCOME MEASURES (PROM):  MULTIFACTORIAL MEMORY QUESTIONNAIRE (MMQ)  Administered patient self-reported outcome measure Multifactorial Memory Questionnaire (MMQ). The Multifactorial Memory Questionnaire [employer] [facility]) consists of three scales measuring separate aspects of metamemory; Satisfaction, Ability and Strategy.   Pt's responses are converted to T-Scores with severity levels based on pt's T-Score.   Severity Levels (T-score) Very Low - < 20 Low - 20 to 29 Below Average - 30-39 Average - 40 to 60 Above Average -  60 to 70 High - 71 to 80 Very High - > 80  Pt reports:  Very Low - < 20 use of Memory Strategies (T-score: 31) Of note, only the Memory Strategies subtests was given.      TODAY'S TREATMENT:  Pt completed Memory Strategies subtest  of MMQ in order to facilitate dialogue about CLOF and use of compensation. Education provided re: types of attention, types of memory, and attention/memory strategies (both internal and external). Pt with use of several strategies; however, identified telling himself to focus, taking breaks, and use of to do lists as compensations to trial. Noted pt commented re: stress of household tasks and navigating personal and wife's health issues. Will provided education re: role of stress on cognition in upcoming sessions.    PATIENT EDUCATION: Education details: as above Person educated: Patient Education method: Programmer, multimedia; Handout  Education comprehension: verbalized understanding  HOME EXERCISE PROGRAM:        Start employing attention/memory strategies    GOALS:  Goals reviewed with patient? Yes  SHORT TERM GOALS: Target date: 10 sessions  With Min A, pt will verbalize and implement x3 attention and memory strategies to aid daily functioning at home and work with over 2 sessions.  Baseline: Goal status: INITIAL   2.  With min A, pt will utilize compensatory strategies for anomia during structured tasks. Baseline:  Goal status: INITIAL    LONG TERM GOALS: Target date: 12 weeks  Patient will demonstrate knowledge of appropriate activities to support cognitive and language function outside of ST. Baseline:  Goal status: INITIAL    ASSESSMENT:  CLINICAL IMPRESSION:  Pt is an 83 y.o. male who presents for cognitive-communication tx in setting of mild cognitive impairment. Pt with reports of gradual memory loss. PMHx including, but not limited to, heart cath in 2019, CAD, HTN, HLD, DMII, TIA, and anxiety. Initial assessment completed via formal means (Addenbrooke's Cognitive Evaluation - III) and diagnostic interviewing. Pt presents with mild cognitive-linguistic deficits affecting attention, memory, and verbal fluency. On evaluation, pt reports difficulty remembering names of  places and people as well as difficulty managing the details of what he needs to remember (e.g. taking a medication prior to dentist). See details of tx above. Recommend course of ST with emphasis on compensation and education for above mentioned deficits.    OBJECTIVE IMPAIRMENTS include attention, memory, and verbal fluecny. These impairments are limiting patient from managing appointments and effectively communicating at home and in community. Factors affecting potential to achieve goals and functional outcome are ongoing medical workup pending.. Patient will benefit from skilled SLP services to address above impairments and improve overall function.  REHAB POTENTIAL: Good  PLAN: SLP FREQUENCY: 1x/week  SLP DURATION: 12 weeks  PLANNED INTERVENTIONS: Cognitive reorganization, Internal/external aids, Functional tasks, SLP instruction and feedback, Compensatory strategies, and Patient/family education    Delon Bangs, M.S., CCC-SLP Speech-Language Pathologist [facility] [phone] [place])   [facility] Outpatient Rehabilitation at [facility] [address] [place], [place], [address] Phone: [phone]   Fax:  [phone]

## 2024-01-20 ENCOUNTER — Ambulatory Visit

## 2024-01-21 NOTE — Progress Notes (Signed)
 Subjective:     Patient ID: Connor Hall is a 83 y.o.  Wife diagnosed with COVID 4d ago  URI  This is a new problem. The current episode started today (overnight). The problem has been gradually worsening. There has been no fever (temp at home 98.7). Associated symptoms include congestion, headaches, sinus pain and a sore throat. Pertinent negatives include no chest pain, coughing, diarrhea, dysuria, ear pain, nausea, neck pain, plugged ear sensation, rash, rhinorrhea, swollen glands, vomiting or wheezing. Treatments tried: nothing.    The following portions of the patient's history were reviewed and updated as appropriate.  Past Medical History:  has a past medical history of Abdominal hernia, Allergic state, Anemia (10/11/2016), Anxiety, Arthritis, Asthma without status asthmaticus, Cancer (CMS/HHS-HCC) (Skin Cancer), Cataract cortical, senile (Removed 11/17 & 1/18), Cervicalgia, Colon polyp, COVID-19 (12/18/2019), COVID-19 (12/2019), Diabetes mellitus type 2, uncomplicated (CMS/HHS-HCC) (11/03/2013), Diverticulitis, Diverticulosis, Dyspnea, GERD (gastroesophageal reflux disease), Headache, Hernia of abdominal cavity, History of hiatal hernia, Hyperlipidemia, Hypertension, Hypogonadism in male, Microalbuminuria, Obesity, Sleep apnea, 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  Past Surgical History:  has a past surgical history that includes Appendectomy; Trigger release (Right); Back surgery; Colonoscopy (06/19/2011, 02/20/2006); egd (06/19/2011); Arthrocopic release of the long head of the biceps tendon, followed by arthroscopic subacromial decompression and mini incision rotator cuff repair (Right, 07/01/2014); Knee arthroscopy (Left, 08/30/2015); Sigmoidoscopy; Colonoscopy (01/15/2017); Upper gastrointestinal endoscopy; egd (01/15/2017); Cataract extraction (Left, 05/29/2016); Tonsillectomy; left heart cath and coronary angiography (Left, 11/24/2017); Cataract extraction (Right, 04/03/2016); endoscopic carpal tunnel release (Bilateral); Mohs surgery; Inguinal hernia repair (Right, 05/19/2019); Inguinal hernia repair (Left, 06/21/2020); Colonoscopy (06/01/2021); egd (06/01/2021); Replacement total knee (Right, 2022); and excision back cyst (04/04/2022).  Family History: family history includes Blindness in his sister; Colon polyps in his father; Diabetes in his brother, brother, daughter, and maternal grandmother; Myocardial Infarction (Heart attack) (age of onset: 6) in his father; No Known Problems in his maternal grandfather, paternal grandfather, and paternal grandmother; Pancreatic cancer in his mother.  Social History:  reports that he quit smoking about 50 years ago. His smoking use included cigarettes. He started smoking about 65 years ago. He has a 15 pack-year smoking history. He has never been exposed to tobacco smoke. He has never used smokeless tobacco. He reports that he does not drink alcohol and does not use drugs.  Current Medications: has  a current medication list which includes the following prescription(s): amlodipine , blood glucose diagnostic, clopidogrel , cyanocobalamin , ferrous sulfate , glipizide , loratadine, melatonin, metformin , mometasone, mv-min-folic-k1-lycopen-lutein, onetouch ultramini, pantoprazole , polyethylene glycol, pravastatin , pyridoxine (vitamin b6), tramadol , triamcinolone, azithromycin, benzonatate, and prednisone.  Prior to encounter Medications:  Current Outpatient Medications on File Prior to Visit  Medication Sig Dispense Refill  . amLODIPine  (NORVASC ) 5 MG tablet  Take 1 tablet (5 mg total) by mouth once daily 90 tablet 3  . blood glucose diagnostic test strip Use once daily Use as instructed. 100 each 3  . clopidogreL  (PLAVIX ) 75 mg tablet TAKE 1 TABLET BY MOUTH EVERY DAY 90 tablet 1  . cyanocobalamin  (VITAMIN B12) 1000 MCG tablet Take 1 tablet (1,000 mcg total) by mouth once daily    . ferrous sulfate  325 (65 FE) MG tablet Take 1 tablet (325 mg total) by mouth daily with breakfast    . glipiZIDE  (GLUCOTROL  XL) 10 MG XL tablet TAKE 1 TABLET EVERY MORNING 90 tablet 1  . loratadine (CLARITIN) 10 mg tablet TAKE 1 TABLET BY MOUTH EVERY DAY 90 tablet 3  . melatonin 10 mg Tab Take 1 tablet by mouth at bedtime    . metFORMIN  (GLUCOPHAGE ) 1000 MG tablet TAKE 1 TABLET (1,000 MG TOTAL) BY MOUTH 2 (TWO) TIMES DAILY WITH MEALS FOR 180 DAYS 180 tablet 1  . mometasone (NASONEX) 50 mcg/actuation nasal spray Place 2 sprays into both nostrils once daily 51 g 3  . multivit-min-FA-lycopen-lutein 300-600-300 mcg Tab Take 1 tablet by mouth once daily      . ONETOUCH ULTRAMINI kit Use as instructed. 1 each 0  . pantoprazole  (PROTONIX ) 20 MG DR tablet TAKE 1 TABLET BY MOUTH EVERY DAY 90 tablet 1  . polyethylene glycol (MIRALAX ) packet Take 1 packet (17 g total) by mouth once daily Mix in 4-8ounces of fluid prior to taking. 14 packet 0  . pravastatin  (PRAVACHOL ) 80 MG tablet Take 1 tablet (80 mg total) by mouth once daily 90  tablet 3  . pyridoxine, vitamin B6, (VITAMIN B-6) 50 MG tablet Take 1 tablet (50 mg total) by mouth once daily    . traMADoL  (ULTRAM ) 50 mg tablet Take 1 tablet (50 mg total) by mouth every 6 (six) hours as needed 60 tablet 0  . triamcinolone 0.1 % cream Apply topically 2 (two) times daily as needed     No current facility-administered medications on file prior to visit.    Allergies: is allergic to ace inhibitors, arb-angiotensin receptor antagonist, aspirin , penicillins, and other.  Review of Systems  Constitutional:  Positive for fever and malaise/fatigue. Negative for chills.  HENT:  Positive for congestion, sinus pain and sore throat. Negative for ear pain, rhinorrhea and tinnitus.   Eyes:  Negative for pain, discharge and redness.  Respiratory:  Negative for cough, shortness of breath and wheezing.   Cardiovascular:  Negative for chest pain.  Gastrointestinal:  Negative for diarrhea, nausea and vomiting.  Genitourinary:  Negative for dysuria.  Musculoskeletal:  Negative for myalgias and neck pain.  Skin:  Negative for rash.  Neurological:  Positive for headaches.       Objective:   BP (!) 147/68   Pulse 96   Temp 36.8 C (98.2 F) (Oral)   Ht 182.9 cm (6')   Wt 97.4 kg (214 lb 12.8 oz)   SpO2 97%   BMI 29.13 kg/m    Physical Exam Vitals and nursing note reviewed.  Constitutional:      General: He is not in acute distress.    Appearance: Normal appearance. He is well-developed. He is ill-appearing. He is not toxic-appearing or diaphoretic.  HENT:     Head: Normocephalic and atraumatic.     Right Ear: Tympanic membrane and external ear normal.     Left Ear: Tympanic membrane and external ear normal.     Nose: Congestion present. No rhinorrhea.  Mouth/Throat:     Pharynx: Oropharyngeal exudate and posterior oropharyngeal erythema present.     Tonsils: No tonsillar abscesses.  Eyes:     Conjunctiva/sclera: Conjunctivae normal.     Pupils: Pupils are equal,  round, and reactive to light.  Neck:     Thyroid : No thyromegaly.     Trachea: No tracheal deviation.  Cardiovascular:     Rate and Rhythm: Normal rate and regular rhythm.     Heart sounds: Normal heart sounds.  Pulmonary:     Effort: Pulmonary effort is normal. No respiratory distress.     Breath sounds: Normal breath sounds. No wheezing.  Musculoskeletal:        General: Normal range of motion.     Cervical back: Normal range of motion and neck supple.  Lymphadenopathy:     Cervical: No cervical adenopathy.  Skin:    General: Skin is warm and dry.  Neurological:     Mental Status: He is alert and oriented to person, place, and time.  Psychiatric:        Mood and Affect: Mood normal.        Behavior: Behavior normal.     Results for orders placed or performed in visit on 01/21/24  Basic Respiratory Panel - [facility]   Specimen: Nasal Swab; Other  Result Value Ref Range   Influenza A PCR Negative Negative   Influenza B PCR Negative Negative   SARS-CoV2 PCR Positive (!) Negative   Narrative   Positive  Positive results are indicative of the presence of the identified virus, but do not rule out bacterial infection or co-infection with other pathogens not detected by the test. Clinical correlation with patient history and other diagnostic information is necessary to determine patient infection status.  Negative  Negative results do not preclude SARS-CoV-2 infection, influenza A and/or influenza B virus and should not be used as the sole basis for treatment or other patient management decisions. Negative results must be combined with clinical observations, patient history, and/or epidemiological information.       Assessment:     1. COVID-19 virus detected -     azithromycin (ZITHROMAX) 250 MG tablet; 2 tabs on day one, then 1 tab daily x 4 days  Dispense: 6 tablet; Refill: 0 -     predniSONE (DELTASONE) 20 MG tablet; Take 1 tablet (20 mg total) by mouth once daily for 5  days  Dispense: 5 tablet; Refill: 0 -     benzonatate (TESSALON) 100 MG capsule; Take 1 capsule (100 mg total) by mouth 3 (three) times daily as needed for Cough for up to 7 days  Dispense: 20 capsule; Refill: 0  2. Nasal congestion -     Basic Respiratory Panel - [facility]  3. Exposure to COVID-19 virus   Will prescribe antibiotics due to underlying comorbidities and add tessalon in case develops a cough. Not a candidate for paxlovid due to plavix .    Plan:     Patient Instructions  You are being treated for COVID. You are being prescribed antibiotics and prednisone, please take as directed. Warm compresses, nasal saline washes, steam showers, warm salt water gargles for further symptomatic relief. OTC Flonase nasal spray for congestion. Tylenol  per package instructions for pain relief. Follow up in clinic if symptoms persist despite treatment. Go to ED if develop chest pain, shortness of breath, sudden/severe headache, neurologic symptoms, changes in vision, neck stiffness.

## 2024-01-26 ENCOUNTER — Ambulatory Visit

## 2024-02-02 ENCOUNTER — Ambulatory Visit

## 2024-02-02 DIAGNOSIS — R41841 Cognitive communication deficit: Secondary | ICD-10-CM | POA: Diagnosis not present

## 2024-02-02 NOTE — Therapy (Signed)
 OUTPATIENT SPEECH LANGUAGE PATHOLOGY  TREATMENT   Patient Name: Connor Hall MRN: [id number] DOB:1940/12/20, 83 y.o., male Today's Date: 02/02/2024  PCP: Norleen Rower, MD REFERRING PROVIDER: Allyson Stallion, FNP   End of Session - 02/02/24 1358     Visit Number 3    Number of Visits 10    Date for Recertification  03/17/24    SLP Start Time 1400    SLP Stop Time  1445    SLP Time Calculation (min) 45 min    Activity Tolerance Patient tolerated treatment well           Patient Active Problem List   Diagnosis Date Noted   S/P total knee arthroplasty, right 09/12/2020   Constipation 09/11/2020   Asthma 09/11/2020   Hyponatremia 09/11/2020   History of total knee arthroplasty, right 09/06/2020   Aphasia 03/20/2020   TIA (transient ischemic attack) 03/19/2020   GERD (gastroesophageal reflux disease)    Diabetes mellitus without complication (HCC)    Sleep apnea    Coronary artery disease involving native coronary artery of native heart 12/08/2017   Angina decubitus 11/24/2017   SOBOE (shortness of breath on exertion) 11/20/2017   Pain of left hand 08/21/2017   Carpal tunnel syndrome of left wrist 08/01/2017   Iron deficiency anemia 10/11/2016   FH: colon polyps 10/11/2016   Primary osteoarthritis of left knee 10/09/2015   Tear of meniscus of knee 07/26/2015   Left knee pain 07/24/2015   Subcoracoid bursitis of right shoulder 12/09/2014   Rotator cuff tear, non-traumatic, right 07/07/2014   Impingement syndrome, shoulder, right 03/23/2014   Malignant neoplasm of skin of nose 02/07/2014   Asthma without status asthmaticus 11/03/2013   Diabetes mellitus type 2, uncomplicated (HCC) 11/03/2013   Hyperlipidemia 11/03/2013   Hypertension 11/03/2013   Obesity 11/03/2013    ONSET DATE: referral date 12/17/23   REFERRING DIAG: mild cognitive impairment  THERAPY DIAG:  Cognitive communication deficit  Rationale for Evaluation and Treatment  Rehabilitation  SUBJECTIVE:   SUBJECTIVE STATEMENT: Pt alert, pleasant, and cooperative Pt accompanied by: self  PERTINENT HISTORY & DIAGNOSTIC FINDINGS: Pt is an 83 y.o. male who presents for cognitive-communication evaluation in setting of mild cognitive impairment. Pt with reports of gradual memory loss. PMHx including, but not limited to, heart cath in 2019, CAD, HTN, HLD, DMII, TIA, and anxiety. Head CT 08/07/22, Brain: Stable chronic small-vessel ischemic changes throughout the periventricular white matter. No evidence of acute infarct or hemorrhage. Lateral ventricles and midline structures are unremarkable. There are no acute extra-axial fluid collections. No mass effect. Vascular: Stable atherosclerosis of the internal carotid arteries. No hyperdense vessels... 12/17/23: Beta-amyloid 42/40 Ratio .93L     PAIN:  Are you having pain? FACES 0/10   FALLS: Has patient fallen in last 6 months?  No  LIVING ENVIRONMENT: Lives with: lives with their spouse Lives in: House/apartment  PLOF:  Level of assistance: Independent with ADLs Employment: Agricultural consultant work   PATIENT GOALS   improve recall of names  OBJECTIVE:  TODAY'S TREATMENT:  Reviewed re: types of attention, types of memory, and attention/memory strategies (both internal and external). Pt with use of several strategies; however, identified telling himself to focus, taking breaks, and use of to do lists as compensations to trial last week - pt reported successful implementation of strategies. Noted pt commented re: stress of household tasks and navigating personal and wife's health issues. Education provided re: role of stress and caregiver on cognition as well as attention/memory strategies to  help promote success at home and in the community. Additional education provided re: normal changes to cognition and ways to promote cognitive functioning. Pt would like to f/u with strategies to improve name recall next  session.   PATIENT EDUCATION: Education details: as above Person educated: Patient Education method: Explanation; Handout  Education comprehension: verbalized understanding  HOME EXERCISE PROGRAM:        Continue using attention/memory strategies    GOALS:  Goals reviewed with patient? Yes  SHORT TERM GOALS: Target date: 10 sessions  With Min A, pt will verbalize and implement x3 attention and memory strategies to aid daily functioning at home and work with over 2 sessions.  Baseline: Goal status: INITIAL   2.  With min A, pt will utilize compensatory strategies for anomia during structured tasks. Baseline:  Goal status: INITIAL    LONG TERM GOALS: Target date: 12 weeks  Patient will demonstrate knowledge of appropriate activities to support cognitive and language function outside of ST. Baseline:  Goal status: INITIAL    ASSESSMENT:  CLINICAL IMPRESSION:  Pt is an 83 y.o. male who presents for cognitive-communication tx in setting of mild cognitive impairment. Pt with reports of gradual memory loss. PMHx including, but not limited to, heart cath in 2019, CAD, HTN, HLD, DMII, TIA, and anxiety. Initial assessment completed via formal means ([place]'s Cognitive Evaluation - III) and diagnostic interviewing. Pt presents with mild cognitive-linguistic deficits affecting attention, memory, and verbal fluency. On evaluation, pt reports difficulty remembering names of places and people as well as difficulty managing the details of what he needs to remember (e.g. taking a medication prior to dentist). See details of tx above. Recommend course of ST with emphasis on compensation and education for above mentioned deficits.    OBJECTIVE IMPAIRMENTS include attention, memory, and verbal fluecny. These impairments are limiting patient from managing appointments and effectively communicating at home and in community. Factors affecting potential to achieve goals and functional  outcome are ongoing medical workup pending.. Patient will benefit from skilled SLP services to address above impairments and improve overall function.  REHAB POTENTIAL: Good  PLAN: SLP FREQUENCY: 1x/week  SLP DURATION: 12 weeks  PLANNED INTERVENTIONS: Cognitive reorganization, Internal/external aids, Functional tasks, SLP instruction and feedback, Compensatory strategies, and Patient/family education    Delon Bangs, M.S., CCC-SLP Speech-Language Pathologist [facility] [phone] [place])  [place] [place] [address] [place], [place], [address] Phone: [phone]   Fax:  [phone]

## 2024-02-03 ENCOUNTER — Ambulatory Visit

## 2024-02-04 ENCOUNTER — Encounter

## 2024-02-10 ENCOUNTER — Ambulatory Visit: Attending: Neurology

## 2024-02-10 DIAGNOSIS — R41841 Cognitive communication deficit: Secondary | ICD-10-CM | POA: Diagnosis present

## 2024-02-10 NOTE — Therapy (Signed)
 OUTPATIENT SPEECH LANGUAGE PATHOLOGY  TREATMENT   Patient Name: Connor Hall MRN: [id number] DOB:11/16/1940, 83 y.o., male Today's Date: 02/10/2024  PCP: Norleen Rower, MD REFERRING PROVIDER: Allyson Stallion, FNP   End of Session - 02/10/24 1440     Visit Number 4    Number of Visits 10    Date for Recertification  03/17/24    SLP Start Time 1400    SLP Stop Time  1440    SLP Time Calculation (min) 40 min           Patient Active Problem List   Diagnosis Date Noted   S/P total knee arthroplasty, right 09/12/2020   Constipation 09/11/2020   Asthma 09/11/2020   Hyponatremia 09/11/2020   History of total knee arthroplasty, right 09/06/2020   Aphasia 03/20/2020   TIA (transient ischemic attack) 03/19/2020   GERD (gastroesophageal reflux disease)    Diabetes mellitus without complication (HCC)    Sleep apnea    Coronary artery disease involving native coronary artery of native heart 12/08/2017   Angina decubitus 11/24/2017   SOBOE (shortness of breath on exertion) 11/20/2017   Pain of left hand 08/21/2017   Carpal tunnel syndrome of left wrist 08/01/2017   Iron deficiency anemia 10/11/2016   FH: colon polyps 10/11/2016   Primary osteoarthritis of left knee 10/09/2015   Tear of meniscus of knee 07/26/2015   Left knee pain 07/24/2015   Subcoracoid bursitis of right shoulder 12/09/2014   Rotator cuff tear, non-traumatic, right 07/07/2014   Impingement syndrome, shoulder, right 03/23/2014   Malignant neoplasm of skin of nose 02/07/2014   Asthma without status asthmaticus 11/03/2013   Diabetes mellitus type 2, uncomplicated (HCC) 11/03/2013   Hyperlipidemia 11/03/2013   Hypertension 11/03/2013   Obesity 11/03/2013    ONSET DATE: referral date 12/17/23   REFERRING DIAG: mild cognitive impairment  THERAPY DIAG:  Cognitive communication deficit  Rationale for Evaluation and Treatment Rehabilitation  SUBJECTIVE:   SUBJECTIVE STATEMENT: Pt alert, pleasant, and  cooperative Pt accompanied by: self  PERTINENT HISTORY & DIAGNOSTIC FINDINGS: Pt is an 83 y.o. male who presents for cognitive-communication evaluation in setting of mild cognitive impairment. Pt with reports of gradual memory loss. PMHx including, but not limited to, heart cath in 2019, CAD, HTN, HLD, DMII, TIA, and anxiety. Head CT 08/07/22, Brain: Stable chronic small-vessel ischemic changes throughout the periventricular white matter. No evidence of acute infarct or hemorrhage. Lateral ventricles and midline structures are unremarkable. There are no acute extra-axial fluid collections. No mass effect. Vascular: Stable atherosclerosis of the internal carotid arteries. No hyperdense vessels... 12/17/23: Beta-amyloid 42/40 Ratio .93L     PAIN:  Are you having pain? FACES 0/10   FALLS: Has patient fallen in last 6 months?  No  LIVING ENVIRONMENT: Lives with: lives with their spouse Lives in: House/apartment  PLOF:  Level of assistance: Independent with ADLs Employment: Agricultural consultant work   PATIENT GOALS   improve recall of names  OBJECTIVE:  TODAY'S TREATMENT:  Reviewed re: types of attention, types of memory, and attention/memory strategies (both internal and external). Pt with use of several strategies; however, identified telling himself to focus, taking breaks, and use of to do lists as compensations to trial last week - pt reported successful implementation of strategies. Introduced strategies to improve coding and recalling names. Pt returned demo of strategy to code and recall x5 names/faces with extra time. Pt educated re: ways to continue to promote cognitive functioning (e.g. routine, diet, sleep, activity, exercise). Of note,  pt denies wordfinding difficulty - goal d/c. Pt met all other ST goals and is now formally d/c'd from ST.    PATIENT EDUCATION: Education details: as above Person educated: Patient Education method: Programmer, multimedia; Handout  Education comprehension:  verbalized understanding  HOME EXERCISE PROGRAM:        Continue implementing strategies    GOALS:  Goals reviewed with patient? Yes  SHORT TERM GOALS: Target date: 10 sessions  With Min A, pt will verbalize and implement x3 attention and memory strategies to aid daily functioning at home and work with over 2 sessions.  Baseline: Goal status: MET 2.  With min A, pt will utilize compensatory strategies for anomia during structured tasks. Baseline:  Goal status: D/C goal - no longer problematic    LONG TERM GOALS: Target date: 12 weeks  Patient will demonstrate knowledge of appropriate activities to support cognitive and language function outside of ST. Baseline:  Goal status: MET    ASSESSMENT:  CLINICAL IMPRESSION:  Pt is an 83 y.o. male who presents for cognitive-communication tx in setting of mild cognitive impairment. Pt has participated in course of ST with successful implementation of attention/memory strategies and participation in education as well as subjective report of improved memory. See details of tx above.  PLAN: D/C ST   Delon Bangs, M.S., CCC-SLP Speech-Language Pathologist [facility] [phone] [place])  [place] [facility] at [facility] [address] [place], [place], [address] Phone: [phone]   Fax:  [phone]
